# Patient Record
Sex: Female | Born: 1940
Health system: Southern US, Community
[De-identification: ages and names within clinical notes are randomized; demographics above are authoritative.]

## PROBLEM LIST (undated history)

## (undated) DIAGNOSIS — I10 Essential (primary) hypertension: Secondary | ICD-10-CM

## (undated) DIAGNOSIS — Z9221 Personal history of antineoplastic chemotherapy: Secondary | ICD-10-CM

## (undated) DIAGNOSIS — S060X9A Concussion with loss of consciousness of unspecified duration, initial encounter: Secondary | ICD-10-CM

## (undated) DIAGNOSIS — F419 Anxiety disorder, unspecified: Secondary | ICD-10-CM

## (undated) DIAGNOSIS — E785 Hyperlipidemia, unspecified: Secondary | ICD-10-CM

## (undated) DIAGNOSIS — J302 Other seasonal allergic rhinitis: Secondary | ICD-10-CM

## (undated) DIAGNOSIS — M1990 Unspecified osteoarthritis, unspecified site: Secondary | ICD-10-CM

## (undated) DIAGNOSIS — F329 Major depressive disorder, single episode, unspecified: Secondary | ICD-10-CM

## (undated) DIAGNOSIS — M8580 Other specified disorders of bone density and structure, unspecified site: Secondary | ICD-10-CM

## (undated) DIAGNOSIS — Z923 Personal history of irradiation: Secondary | ICD-10-CM

## (undated) DIAGNOSIS — F32A Depression, unspecified: Secondary | ICD-10-CM

## (undated) DIAGNOSIS — F410 Panic disorder [episodic paroxysmal anxiety] without agoraphobia: Secondary | ICD-10-CM

## (undated) HISTORY — DX: Depression, unspecified: F32.A

## (undated) HISTORY — DX: Concussion with loss of consciousness of unspecified duration, initial encounter: S06.0X9A

## (undated) HISTORY — PX: BREAST BIOPSY: SHX20

## (undated) HISTORY — DX: Hyperlipidemia, unspecified: E78.5

## (undated) HISTORY — DX: Anxiety disorder, unspecified: F41.9

## (undated) HISTORY — PX: TONSILLECTOMY: SUR1361

## (undated) HISTORY — DX: Other specified disorders of bone density and structure, unspecified site: M85.80

## (undated) HISTORY — DX: Major depressive disorder, single episode, unspecified: F32.9

---

## 1980-11-02 HISTORY — PX: BLADDER SUSPENSION: SHX72

## 1982-11-02 HISTORY — PX: BREAST SURGERY: SHX581

## 1988-12-09 HISTORY — PX: DILATION AND CURETTAGE OF UTERUS: SHX78

## 1989-01-31 HISTORY — PX: ABDOMINAL HYSTERECTOMY: SHX81

## 1994-11-02 HISTORY — PX: BACK SURGERY: SHX140

## 1999-12-11 ENCOUNTER — Ambulatory Visit (HOSPITAL_COMMUNITY): Admission: RE | Admit: 1999-12-11 | Discharge: 1999-12-11 | Payer: Self-pay | Admitting: Obstetrics & Gynecology

## 2000-01-20 ENCOUNTER — Encounter: Payer: Self-pay | Admitting: Obstetrics and Gynecology

## 2000-01-20 ENCOUNTER — Ambulatory Visit (HOSPITAL_COMMUNITY): Admission: RE | Admit: 2000-01-20 | Discharge: 2000-01-20 | Payer: Self-pay | Admitting: Obstetrics and Gynecology

## 2000-11-02 DIAGNOSIS — S060XAA Concussion with loss of consciousness status unknown, initial encounter: Secondary | ICD-10-CM

## 2000-11-02 DIAGNOSIS — S060X9A Concussion with loss of consciousness of unspecified duration, initial encounter: Secondary | ICD-10-CM

## 2000-11-02 HISTORY — DX: Concussion with loss of consciousness of unspecified duration, initial encounter: S06.0X9A

## 2000-11-02 HISTORY — DX: Concussion with loss of consciousness status unknown, initial encounter: S06.0XAA

## 2002-06-14 ENCOUNTER — Emergency Department (HOSPITAL_COMMUNITY): Admission: EM | Admit: 2002-06-14 | Discharge: 2002-06-15 | Payer: Self-pay | Admitting: Emergency Medicine

## 2002-06-15 ENCOUNTER — Encounter: Payer: Self-pay | Admitting: Emergency Medicine

## 2003-05-11 ENCOUNTER — Encounter: Payer: Self-pay | Admitting: Obstetrics and Gynecology

## 2003-05-11 ENCOUNTER — Ambulatory Visit (HOSPITAL_COMMUNITY): Admission: RE | Admit: 2003-05-11 | Discharge: 2003-05-11 | Payer: Self-pay | Admitting: Obstetrics and Gynecology

## 2003-07-11 ENCOUNTER — Encounter: Payer: Self-pay | Admitting: Internal Medicine

## 2003-07-11 ENCOUNTER — Ambulatory Visit (HOSPITAL_COMMUNITY): Admission: RE | Admit: 2003-07-11 | Discharge: 2003-07-11 | Payer: Self-pay | Admitting: Internal Medicine

## 2004-05-14 ENCOUNTER — Ambulatory Visit (HOSPITAL_COMMUNITY): Admission: RE | Admit: 2004-05-14 | Discharge: 2004-05-14 | Payer: Self-pay | Admitting: Obstetrics and Gynecology

## 2004-08-06 ENCOUNTER — Ambulatory Visit (HOSPITAL_COMMUNITY): Admission: RE | Admit: 2004-08-06 | Discharge: 2004-08-06 | Payer: Self-pay | Admitting: Neurology

## 2004-12-12 ENCOUNTER — Observation Stay (HOSPITAL_COMMUNITY): Admission: EM | Admit: 2004-12-12 | Discharge: 2004-12-13 | Payer: Self-pay | Admitting: Emergency Medicine

## 2004-12-17 ENCOUNTER — Encounter (HOSPITAL_COMMUNITY): Admission: RE | Admit: 2004-12-17 | Discharge: 2004-12-18 | Payer: Self-pay | Admitting: Internal Medicine

## 2005-07-14 ENCOUNTER — Ambulatory Visit (HOSPITAL_COMMUNITY): Admission: RE | Admit: 2005-07-14 | Discharge: 2005-07-14 | Payer: Self-pay | Admitting: Internal Medicine

## 2005-08-07 ENCOUNTER — Ambulatory Visit: Payer: Self-pay | Admitting: Internal Medicine

## 2005-08-07 ENCOUNTER — Ambulatory Visit (HOSPITAL_COMMUNITY): Admission: RE | Admit: 2005-08-07 | Discharge: 2005-08-07 | Payer: Self-pay | Admitting: Internal Medicine

## 2005-08-07 ENCOUNTER — Encounter (INDEPENDENT_AMBULATORY_CARE_PROVIDER_SITE_OTHER): Payer: Self-pay | Admitting: Internal Medicine

## 2009-10-09 ENCOUNTER — Ambulatory Visit (HOSPITAL_COMMUNITY): Admission: RE | Admit: 2009-10-09 | Discharge: 2009-10-09 | Payer: Self-pay | Admitting: Internal Medicine

## 2010-08-20 ENCOUNTER — Encounter (INDEPENDENT_AMBULATORY_CARE_PROVIDER_SITE_OTHER): Payer: Self-pay | Admitting: *Deleted

## 2010-12-02 ENCOUNTER — Other Ambulatory Visit (HOSPITAL_COMMUNITY): Payer: Self-pay | Admitting: Internal Medicine

## 2010-12-02 DIAGNOSIS — Z139 Encounter for screening, unspecified: Secondary | ICD-10-CM

## 2010-12-02 NOTE — Letter (Signed)
Summary: Recall, Screening Colonoscopy Only  [facility]  [address]   [place], [place] [address]   Phone: [phone]  Fax: [phone]    August 20, 2010  MADLYNN LUNDEEN [address] [place], [place]  [address] [birth year]   Dear Ms. Rizzi,   Our records indicate it is time to schedule your colonoscopy.    Please call our office at [phone] and ask for the nurse.   Thank you,    Hendricks Limes, LPN Cloria Spring, LPN  [facility] Ph: [phone]   Fax: [phone]

## 2010-12-15 ENCOUNTER — Ambulatory Visit (HOSPITAL_COMMUNITY)
Admission: RE | Admit: 2010-12-15 | Discharge: 2010-12-15 | Disposition: A | Discharge status: Home / Self Care | Payer: MEDICARE | Source: Ambulatory Visit | Attending: Internal Medicine | Admitting: Internal Medicine

## 2010-12-15 DIAGNOSIS — Z1231 Encounter for screening mammogram for malignant neoplasm of breast: Secondary | ICD-10-CM | POA: Insufficient documentation

## 2010-12-15 DIAGNOSIS — Z139 Encounter for screening, unspecified: Secondary | ICD-10-CM

## 2011-03-20 NOTE — Op Note (Signed)
Doris Ellis, Doris Ellis                  ACCOUNT NO.:  [id number]   MEDICAL RECORD NO.:  [id number]          PATIENT TYPE:  AMB   LOCATION:  DAY                           FACILITY:  [facility]   PHYSICIAN:  Lionel December, M.D.    DATE OF BIRTH:  [birth year]   DATE OF PROCEDURE:  08/07/2005  DATE OF DISCHARGE:                                 OPERATIVE REPORT   PROCEDURE:  Colonoscopy.   INDICATION:  Doris Ellis is a 71 year old Caucasian female with progressive  constipation and lower abdominal pain. She has had hematochezia felt to be  secondary to hemorrhoids. She is undergoing diagnostic colonoscopy.  Procedure risks were reviewed with the patient, and informed consent was  obtained.   PREMEDICATION:  Demerol 50 mg IV, Versed 6 mg IV.   FINDINGS:  Procedure performed in endoscopy suite. The patient's vital signs  and O2 saturation were monitored during the procedure and remained stable.  The patient was placed in left lateral position and rectal examination  performed. No abnormality noted on external or digital exam. Olympus  videoscope was placed in rectum and advanced under vision into sigmoid colon  and beyond. Preparation was excellent. Scope was passed to cecum which was  identified by ileocecal valve and appendiceal orifice. Pictures taken for  the record. As the scope was withdrawn, colonic mucosa was carefully  examined. A single small polyp at sigmoid colon which was ablated via cold  biopsy. Mucosa of the rest of the sigmoid colon and rectum was normal. Scope  was retroflexed to examine anorectal junction, and small hemorrhoids were  noted below the dentate line. Endoscope was straightened and withdrawn. The  patient tolerated the procedure well.   FINAL DIAGNOSIS:  1.  Single small polyp ablated via cold biopsy from the sigmoid colon.  2.  External hemorrhoids.   No evidence of colonic stricture, diverticulosis or mass.   RECOMMENDATIONS:  1.  High-fiber diet.  2.  FiberChoice 2  tablets daily.  3.  Lactulose 1 to 2 tablespoonful q.d. or q.o.d. Prescription given for 1      quart with refills for up to year. If this      therapy does not help with her constipation, she will call us. If      therapy does not ameliorate her constipation, will consider other      alternatives.  4.  I will be contacting the patient with biopsy results and further      recommendations.      Lionel December, M.D.  Electronically Signed     NR/MEDQ  D:  08/07/2005  T:  08/07/2005  Job:  [id number]   cc:   Kingsley Callander. Ouida Sills, MD  Fax: [phone]

## 2011-03-20 NOTE — Procedures (Signed)
NAMEALAINAH, PHANG                  ACCOUNT NO.:  [id number]   MEDICAL RECORD NO.:  [id number]          PATIENT TYPE:  OBV   LOCATION:  IC10                          FACILITY:  [facility]   PHYSICIAN:  Kingsley Callander. Ouida Sills, MD       DATE OF BIRTH:  [birth year]   DATE OF PROCEDURE:  DATE OF DISCHARGE:  12/13/2004                                    STRESS TEST   Ms. Loughner underwent an exercise stress test to evaluate recent symptoms of  chest tightness overnight last week and had ruled out for an MI. She  exercised 6 minutes 39 seconds (39 seconds into stage III of the Bruce  protocol) obtaining a maximal heart rate of 153 (96% of the age predicted  maximal heart rate) and a workload of 7 mets and discontinued exercise after  surpassing her target heart rate. There were no symptoms of chest pain.  There were no arrhythmias. There were no ST segment changes diagnostic of  ischemia. The baseline electrocardiogram revealed normal sinus rhythm at 64  beats per minute with small Q waves inferiorly. There were slight early  repolarization changes in the inferolateral leads.   IMPRESSION:  No evidence of exercise-induced ischemia. Myoview images are  pending.      ROF/MEDQ  D:  12/17/2004  T:  12/17/2004  Job:  [id number]

## 2011-03-20 NOTE — H&P (Signed)
Doris Ellis, Doris Ellis                  ACCOUNT NO.:  [id number]   MEDICAL RECORD NO.:  [id number]          PATIENT TYPE:  INP   LOCATION:  IC10                          FACILITY:  [facility]   PHYSICIAN:  Kingsley Callander. Ouida Sills, MD       DATE OF BIRTH:  Mar 14, 1941   DATE OF ADMISSION:  12/12/2004  DATE OF DISCHARGE:  LH                                HISTORY & PHYSICAL   CHIEF COMPLAINT:  Chest tightness and neck tightness.   HISTORY OF PRESENT ILLNESS:  This patient is a 70 year old white female who  presented to the emergency room after experiencing tightness in the upper  chest and neck. 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 She had been taken over to Dr. Sherwood Gambler for an EKG which was  reportedly abnormal. She was then sent to the emergency room where she was  evaluated. She was treated with nitroglycerin and had a partial response.  Her pain lessened. She did not experience shortness of breath, vomiting, or  diaphoresis. She had felt somewhat flushed. There were no associated arm  symptoms. She had not had exertional chest pain. She is a nonsmoker. She has  a history of hypercholesterolemia. Her last cholesterol was 243 last  September. She also had a mildly elevated glucose of 114. Her hemoglobin A1c  was 5.8. Her chest pain subsequently resolved in the emergency room.   PAST MEDICAL HISTORY:  1.  Hysterectomy.  2.  Cholecystectomy.  3.  Breast cyst excision.  4.  Herniated disk.  5.  Hypercholesterolemia.  6.  Cervical spondylosis.   MEDICATIONS:  1.  Climara 0.05 patch.  2.  Toradol p.r.n.  3.  Flexeril p.r.n.  4.  Xanax 0.5 h.s.   ALLERGIES:  None.   SOCIAL HISTORY:  She has not smoked cigarettes, drank alcohol, or used  drugs.   FAMILY HISTORY:  Father had leukemia. Mother had Guillain-Barre.   REVIEW OF SYSTEMS:  Noncontributory.   PHYSICAL EXAMINATION:  VITAL SIGNS:  Temperature 97.3, pulse 85,  respirations 16, blood pressure 135/89. Oxygen  saturation 95% on room air.  GENERAL:  Alert and oriented in no distress.  HEENT:  Eyes and pharynx appear normal, atraumatic and normocephalic.  NECK:  No JVD, thyromegaly, or carotid bruits.  LUNGS:  Clear.  HEART:  Regular with no murmurs or gallops.  CHEST:  Nontender.  ABDOMEN:  Nontender. No hepatosplenomegaly.  EXTREMITIES:  No calf tenderness or swelling. No cyanosis, clubbing, or  edema. Distal pulses are intact.  NEUROLOGICAL:  Grossly intact.  SKIN:  Warm and dry.   LABORATORY DATA:  White count 7.2, hemoglobin 15.2, platelets 309. Sodium  134, potassium 3.4, chloride 102, bicarb 24, BUN 12, creatinine 0.8, glucose  103. CK-MB 1.7 and 1.2, troponin I 0.05 and 0.05, myoglobin 43 and 69. Her  EKG reveals normal sinus rhythm with no ischemic changes. She has mild early  repolarization changes in the inferolateral leads. Her chest x-ray is  normal.   IMPRESSION:  1.  Chest pain. She will be admitted and  evaluated further with serial      cardiac enzymes and EKGs. She has been started on IV nitroglycerin and      IV heparin in the emergency room. Oral Protonix will be added      empirically.  2.  Hypokalemia. Will supplement orally.  3.  History of hyperlipidemia. Repeat lipids.  4.  Probable anxiety. Treat with Xanax p.r.n.      ROF/MEDQ  D:  12/13/2004  T:  12/13/2004  Job:  [id number]

## 2011-03-20 NOTE — Discharge Summary (Signed)
Doris Ellis, PENZA                  ACCOUNT NO.:  [id number]   MEDICAL RECORD NO.:  [id number]          PATIENT TYPE:  INP   LOCATION:  IC10                          FACILITY:  [facility]   PHYSICIAN:  Kingsley Callander. Ouida Sills, MD       DATE OF BIRTH:  Aug 25, 1941   DATE OF ADMISSION:  12/12/2004  DATE OF DISCHARGE:  02/11/2006LH                                 DISCHARGE SUMMARY   DISCHARGE DIAGNOSES:  1.  Chest pain, myocardial infarction ruled out.  2. Hypercholesterolemia.      3. Anxiety.  4. Cervical spondylosis.   HOSPITAL COURSE:  This patient is a 70 year old female who presented with  tightness in the upper chest and neck.  She was evaluated with serial  cardiac enzymes which were normal and serial EKGs which were normal.  Her  symptoms resolved. She was treated with IV nitroglycerin and IV heparin.  Her nitroglycerin was weaned off without recurrent pain.  She was  hemodynamically stable with a normal heart rate and normal blood pressure.  It was felt that there was a significant anxiety component to her pain.  She  will be evaluated further though with a nuclear stress test next week.  She  was improved and stable for discharge on February 11.   She was hypokalemic on admission at 3.4 and normalized to 3.6 with  supplements.  Additional studies for LFTs were normal.  Lipids and TSH are  pending.   FOLLOWUP:  The patient will be seen for stress test next week.   DISCHARGE MEDICATIONS:  1.  Zoloft 25 mg daily.  2.  Xanax 0.5 mg q.h.s.  3.  Climara 0.05 patch.  4.  Aspirin 81 mg daily.  5.  Toradol 10 mg t.i.d. p.r.n.  6.  Flexeril 5-10 mg t.i.d. p.r.n.   She will remain out of work until after her stress test.      ROF/MEDQ  D:  12/13/2004  T:  12/14/2004  Job:  [id number]

## 2011-07-04 DIAGNOSIS — M8580 Other specified disorders of bone density and structure, unspecified site: Secondary | ICD-10-CM

## 2011-07-04 HISTORY — DX: Other specified disorders of bone density and structure, unspecified site: M85.80

## 2011-07-07 ENCOUNTER — Encounter: Payer: Self-pay | Admitting: Gynecology

## 2011-07-07 ENCOUNTER — Ambulatory Visit (INDEPENDENT_AMBULATORY_CARE_PROVIDER_SITE_OTHER): Payer: Medicare Other | Admitting: Gynecology

## 2011-07-07 VITALS — BP 140/80 | Ht 64.25 in | Wt 154.0 lb

## 2011-07-07 DIAGNOSIS — B373 Candidiasis of vulva and vagina: Secondary | ICD-10-CM

## 2011-07-07 DIAGNOSIS — N898 Other specified noninflammatory disorders of vagina: Secondary | ICD-10-CM

## 2011-07-07 DIAGNOSIS — S060XAA Concussion with loss of consciousness status unknown, initial encounter: Secondary | ICD-10-CM | POA: Insufficient documentation

## 2011-07-07 DIAGNOSIS — N952 Postmenopausal atrophic vaginitis: Secondary | ICD-10-CM

## 2011-07-07 DIAGNOSIS — G4700 Insomnia, unspecified: Secondary | ICD-10-CM

## 2011-07-07 DIAGNOSIS — S060X9A Concussion with loss of consciousness of unspecified duration, initial encounter: Secondary | ICD-10-CM | POA: Insufficient documentation

## 2011-07-07 DIAGNOSIS — E785 Hyperlipidemia, unspecified: Secondary | ICD-10-CM | POA: Insufficient documentation

## 2011-07-07 DIAGNOSIS — Z79890 Hormone replacement therapy (postmenopausal): Secondary | ICD-10-CM

## 2011-07-07 DIAGNOSIS — N951 Menopausal and female climacteric states: Secondary | ICD-10-CM

## 2011-07-07 MED ORDER — FLUCONAZOLE 150 MG PO TABS
150.0000 mg | ORAL_TABLET | Freq: Once | ORAL | Status: AC
Start: 2011-07-07 — End: 2011-07-08

## 2011-07-07 MED ORDER — ALPRAZOLAM 1 MG PO TABS
1.0000 mg | ORAL_TABLET | Freq: Every evening | ORAL | Status: AC | PRN
Start: 2011-07-07 — End: 2011-08-06

## 2011-07-07 MED ORDER — ESTROGENS CONJUGATED 0.625 MG PO TABS: 0.6250 mg | ORAL_TABLET | Freq: Every day | ORAL | Status: DC

## 2011-07-07 NOTE — Progress Notes (Signed)
Doris Ellis 1941/02/06 [id number]        70 y.o.  for followup. She is a former patient of Dr. Leota Sauers and she is on ERT Premarin 0.625 daily.  She reports having tried weaning this in the past and had significant hot flushes and night sweats and has decided to continue on this. She actually states that she is part of the [facility] original study group. She also has tried a transdermal patch but had a reaction and ended up hospitalizing her and when restarting the pill did not have any issues with this.  She is status post TAH/BSO for bleeding in 1990.  Past medical history,surgical history, medications, allergies, family history and social history were all reviewed and documented in the EPIC chart. ROS:  Was performed and pertinent positives and negatives are included in the history.  Exam: chaperone present Filed Vitals:   07/07/11 1046  BP: 140/80   General appearance  Normal Skin grossly normal Head/Neck normal with no cervical or supraclavicular adenopathy thyroid normal Lungs  clear Cardiac RR, without RMG Abdominal  soft, nontender, without masses, organomegaly or hernia Breasts  examined lying and sitting without masses, retractions, discharge or axillary adenopathy. Pelvic  Ext/BUS/vagina  normal atrophic changes noted, white discharge noted KOH wet prep done  Adnexa  Without masses or tenderness    Anus and perineum  normal   Rectovaginal  normal sphincter tone without palpated masses or tenderness.    Assessment/Plan:  70 y.o. female for followup.    #1 ERT. I discussed with the patient the issues of ERT at age 68. The risks of stroke, heart attack, DVT as well as possible breast cancer risks reviewed. The WHI study, ACOG and NAMS statements of lowest dose for shortest period of time was also reviewed. I discussed again the transdermal versus oral route first pass effect and decrease risks of thrombosis with transdermal. After a lengthy discussion she wants to continue on  Premarin 0.625. She's doing well on this she has tried weaning several times and has had significant symptoms. I did suggest that she try weaning again during the winter months and she will try this but I refilled her times a year with Premarin 0.625 one by mouth daily #30 refill x12. #2 White discharge. KOH wet prep is positive for yeast we'll treat with Diflucan 150x1 dose #3 Insomnia. Patient has some issues with insomnia. She has issues with anxiety attacks occasionally. She is using Xanax 0.5 mg as needed and does well with this I refilled Xanax 1 mg #30 with 3 refills to be used 1/2-1 as needed. #4 Health maintenance. Self breast exams on a monthly basis discussed the urge. She had her mammography beginning of this year and she'll repeat the beginning of next year and continue with annual mammography. She is up-to-date with colon screening will followup with them at prescribed intervals. I ordered a bone density DEXA today. Increase calcium vitamin D was reviewed. No blood work was done this is done through her primary who follows her for her hypercholesterolemia and overall routine health maintenance.  I did not do a Pap smear today. I discussed with her current screening recommendations. She is over 65 and status post hysterectomy and has never had an abnormal Pap smear. She has received routine regular Pap smears the last being a year ago which was normal. I discussed with her stop doing Pap smears from now and she is comfortable with these recommendations.   Dara Lords MD, 11:48  AM 07/07/2011

## 2011-07-14 ENCOUNTER — Ambulatory Visit (INDEPENDENT_AMBULATORY_CARE_PROVIDER_SITE_OTHER): Payer: Medicare Other | Admitting: *Deleted

## 2011-07-14 DIAGNOSIS — M949 Disorder of cartilage, unspecified: Secondary | ICD-10-CM

## 2011-07-15 NOTE — Progress Notes (Signed)
Bone density performed

## 2011-07-21 ENCOUNTER — Telehealth (INDEPENDENT_AMBULATORY_CARE_PROVIDER_SITE_OTHER): Payer: Medicare Other | Admitting: Gynecology

## 2011-07-21 ENCOUNTER — Encounter: Payer: Self-pay | Admitting: Gynecology

## 2011-07-21 DIAGNOSIS — M899 Disorder of bone, unspecified: Secondary | ICD-10-CM

## 2011-07-21 DIAGNOSIS — M8580 Other specified disorders of bone density and structure, unspecified site: Secondary | ICD-10-CM

## 2011-07-21 NOTE — Telephone Encounter (Signed)
Tell patient DEXA scan showed osteopenia. I recommend she check a vitamin D level and I put in for this. Otherwise maximize calcium at 1200 mg daily and repeat DEXA in 2 years.

## 2011-07-21 NOTE — Telephone Encounter (Signed)
Patient informed note below.  Patient wanted the lab drawn at [facility] in [place].  We faxed order and they will send results

## 2011-07-23 ENCOUNTER — Telehealth: Payer: Self-pay | Admitting: *Deleted

## 2011-07-23 ENCOUNTER — Other Ambulatory Visit: Payer: Self-pay | Admitting: Gynecology

## 2011-07-23 NOTE — Telephone Encounter (Signed)
Lm for pt to call. Pt needs vit d. Level check.

## 2011-07-24 LAB — VITAMIN D 25 HYDROXY (VIT D DEFICIENCY, FRACTURES): Vit D, 25-Hydroxy: 44 ng/mL (ref 30–89)

## 2011-07-28 ENCOUNTER — Other Ambulatory Visit: Payer: Self-pay | Admitting: Gynecology

## 2011-07-28 ENCOUNTER — Telehealth: Payer: Self-pay | Admitting: *Deleted

## 2011-07-28 DIAGNOSIS — Z79890 Hormone replacement therapy (postmenopausal): Secondary | ICD-10-CM

## 2011-07-28 NOTE — Telephone Encounter (Signed)
Pt was informed with this on 07/21/11. Vit. D has been rechecked

## 2011-07-28 NOTE — Telephone Encounter (Signed)
Error

## 2011-12-07 ENCOUNTER — Other Ambulatory Visit: Payer: Self-pay | Admitting: *Deleted

## 2011-12-07 MED ORDER — ALPRAZOLAM 1 MG PO TABS: 1.0000 mg | ORAL_TABLET | Freq: Every evening | ORAL | Status: AC | PRN

## 2011-12-07 NOTE — Telephone Encounter (Signed)
rx called in

## 2012-01-27 ENCOUNTER — Encounter (INDEPENDENT_AMBULATORY_CARE_PROVIDER_SITE_OTHER): Payer: Self-pay | Admitting: *Deleted

## 2012-04-04 ENCOUNTER — Other Ambulatory Visit: Payer: Self-pay | Admitting: *Deleted

## 2012-04-04 MED ORDER — ALPRAZOLAM 1 MG PO TABS: 1.0000 mg | ORAL_TABLET | Freq: Every evening | ORAL | Status: DC | PRN

## 2012-04-04 NOTE — Telephone Encounter (Signed)
rx called in

## 2012-05-25 ENCOUNTER — Other Ambulatory Visit (HOSPITAL_COMMUNITY): Payer: Self-pay | Admitting: Internal Medicine

## 2012-05-25 DIAGNOSIS — Z139 Encounter for screening, unspecified: Secondary | ICD-10-CM

## 2012-05-26 ENCOUNTER — Ambulatory Visit (HOSPITAL_COMMUNITY)
Admission: RE | Admit: 2012-05-26 | Discharge: 2012-05-26 | Disposition: A | Discharge status: Home / Self Care | Payer: Medicare Other | Source: Ambulatory Visit | Attending: Internal Medicine | Admitting: Internal Medicine

## 2012-05-26 DIAGNOSIS — Z139 Encounter for screening, unspecified: Secondary | ICD-10-CM

## 2012-05-26 DIAGNOSIS — Z1231 Encounter for screening mammogram for malignant neoplasm of breast: Secondary | ICD-10-CM | POA: Insufficient documentation

## 2012-06-20 ENCOUNTER — Other Ambulatory Visit: Payer: Self-pay | Admitting: *Deleted

## 2012-06-20 MED ORDER — ALPRAZOLAM 1 MG PO TABS: 1.0000 mg | ORAL_TABLET | Freq: Every evening | ORAL | Status: DC | PRN

## 2012-07-07 ENCOUNTER — Encounter: Payer: Self-pay | Admitting: Gynecology

## 2012-07-07 ENCOUNTER — Ambulatory Visit (INDEPENDENT_AMBULATORY_CARE_PROVIDER_SITE_OTHER): Payer: Medicare Other | Admitting: Gynecology

## 2012-07-07 VITALS — BP 158/94 | Ht 64.0 in | Wt 148.0 lb

## 2012-07-07 DIAGNOSIS — N952 Postmenopausal atrophic vaginitis: Secondary | ICD-10-CM

## 2012-07-07 DIAGNOSIS — N951 Menopausal and female climacteric states: Secondary | ICD-10-CM

## 2012-07-07 DIAGNOSIS — Z79890 Hormone replacement therapy (postmenopausal): Secondary | ICD-10-CM

## 2012-07-07 DIAGNOSIS — G4700 Insomnia, unspecified: Secondary | ICD-10-CM

## 2012-07-07 MED ORDER — ALPRAZOLAM 1 MG PO TABS: 1.0000 mg | ORAL_TABLET | Freq: Every evening | ORAL | Status: DC | PRN

## 2012-07-07 MED ORDER — ESTROGENS CONJUGATED 0.625 MG PO TABS: 0.6250 mg | ORAL_TABLET | Freq: Every day | ORAL | Status: DC

## 2012-07-07 NOTE — Progress Notes (Signed)
Doris Ellis 06-Mar-1941 [id number]        71 y.o.  G2P2 for follow up exam.  Doing well on ERT.  Past medical history,surgical history, medications, allergies, family history and social history were all reviewed and documented in the EPIC chart. ROS:  Was performed and pertinent positives and negatives are included in the history.  Exam: Sherrilyn Rist assistant Filed Vitals:   07/07/12 1437  BP: 158/94  Height: 5\' 4"  (1.626 m)  Weight: 148 lb (67.132 kg)   General appearance  Normal Skin grossly normal Head/Neck normal with no cervical or supraclavicular adenopathy thyroid normal Lungs  clear Cardiac RR, without RMG Abdominal  soft, nontender, without masses, organomegaly or hernia Breasts  examined lying and sitting without masses, retractions, discharge or axillary adenopathy. Pelvic  Ext/BUS/vagina  normal mild atrophic changes  Adnexa  Without masses or tenderness    Anus and perineum  normal   Rectovaginal  normal sphincter tone without palpated masses or tenderness.    Assessment/Plan:  71 y.o. G2P2 female for annual exam.   1. ERT. Patient continues on Premarin 0.625. Begin discussed options of weaning. I reviewed the WHI study with increased risk of stroke heart attack DVT and breast cancer issues. ACOG and NAMS statements furlough status for shortness period of time reviewed. Patient was to continue, accepts the risks and I refilled her times a year. 2. Insomnia. Patient continues on Xanax 0.5-1 mg nightly. I refilled her #30x4. 3. Mammography. Patient had July 2013.  Will continue with annual mammography. SBE monthly reviewed. 4. Pap smear. No Pap smear done. She is at age 71 with no history of abnormal Pap smears before and status post hysterectomy for benign indications. 5. Osteopenia.  DEXA 07/2011 should T score -1.8 FRAX 11%/1.5%. Increase calcium vitamin D reviewed. Had normal vitamin D last year. We'll repeat DEXA next year at two-year interval. 6. Colonoscopy. Patient due  now she had polyps on colonoscopy 2006. She knows the need to schedule this. 7. Health maintenance. No blood work done today as is all done through her primary physician's office who she sees on her right or basis.  Blood pressure 158/94 discussed with her. Apparently it was elevated at her primary physician's office and on recheck was normal. She does have appointment to see him on a recheck at his office. Follow up one year, sooner as needed.   Dara Lords MD, 3:21 PM 07/07/2012

## 2012-07-07 NOTE — Patient Instructions (Signed)
Follow up one year for annual gynecologic exam 

## 2012-12-27 ENCOUNTER — Other Ambulatory Visit: Payer: Self-pay | Admitting: *Deleted

## 2012-12-27 MED ORDER — ALPRAZOLAM 1 MG PO TABS: 1.0000 mg | ORAL_TABLET | Freq: Every evening | ORAL | Status: DC | PRN

## 2012-12-28 NOTE — Telephone Encounter (Signed)
Called in KW 

## 2012-12-29 ENCOUNTER — Other Ambulatory Visit: Payer: Self-pay

## 2012-12-29 MED ORDER — ALPRAZOLAM 1 MG PO TABS: 1.0000 mg | ORAL_TABLET | Freq: Every evening | ORAL | Status: AC | PRN

## 2012-12-29 NOTE — Telephone Encounter (Signed)
rx called into pharmacy

## 2013-02-07 ENCOUNTER — Encounter (INDEPENDENT_AMBULATORY_CARE_PROVIDER_SITE_OTHER): Payer: Self-pay | Admitting: *Deleted

## 2013-02-16 ENCOUNTER — Encounter (INDEPENDENT_AMBULATORY_CARE_PROVIDER_SITE_OTHER): Payer: Self-pay | Admitting: *Deleted

## 2013-02-16 ENCOUNTER — Telehealth (INDEPENDENT_AMBULATORY_CARE_PROVIDER_SITE_OTHER): Payer: Self-pay | Admitting: *Deleted

## 2013-02-16 ENCOUNTER — Other Ambulatory Visit (INDEPENDENT_AMBULATORY_CARE_PROVIDER_SITE_OTHER): Payer: Self-pay | Admitting: *Deleted

## 2013-02-16 DIAGNOSIS — Z86010 Personal history of colonic polyps: Secondary | ICD-10-CM

## 2013-02-16 DIAGNOSIS — Z1211 Encounter for screening for malignant neoplasm of colon: Secondary | ICD-10-CM

## 2013-02-16 MED ORDER — PEG-KCL-NACL-NASULF-NA ASC-C 100 G PO SOLR: 1.0000 | Freq: Once | ORAL | Status: DC

## 2013-02-16 NOTE — Telephone Encounter (Signed)
Patient needs movi prep 

## 2013-02-20 ENCOUNTER — Telehealth (INDEPENDENT_AMBULATORY_CARE_PROVIDER_SITE_OTHER): Payer: Self-pay | Admitting: *Deleted

## 2013-02-20 DIAGNOSIS — Z1211 Encounter for screening for malignant neoplasm of colon: Secondary | ICD-10-CM

## 2013-02-20 MED ORDER — PEG 3350-KCL-NA BICARB-NACL 420 G PO SOLR: 4000.0000 mL | Freq: Once | ORAL | Status: DC

## 2013-02-20 NOTE — Telephone Encounter (Signed)
Patient needs trilyte, movi prep too expensive 

## 2013-03-02 ENCOUNTER — Telehealth (INDEPENDENT_AMBULATORY_CARE_PROVIDER_SITE_OTHER): Payer: Self-pay | Admitting: *Deleted

## 2013-03-02 NOTE — Telephone Encounter (Signed)
agree

## 2013-03-02 NOTE — Telephone Encounter (Signed)
  Procedure: tcs  Reason/Indication:  Hx polyps  Has patient had this procedure before?  Yes, 2006 (EPIC)  If so, when, by whom and where?    Is there a family history of colon cancer?  no  Who?  What age when diagnosed?    Is patient diabetic?   no      Does patient have prosthetic heart valve?  no  Do you have a pacemaker?  no  Has patient ever had endocarditis? no  Has patient had joint replacement within last 12 months?  no  Is patient on Coumadin, Plavix and/or Aspirin? yes  Medications: asa prn, Wellbutrin 150 mg daily, estradiol 0.5 mg daily, xanax 1 mg daily, pravastatin 10 mg daily, cyclobenzaprine 10 mg prn, vitamins  Allergies: see EPIC  Medication Adjustment: asa 2 days  Procedure date & time: 03/30/13 at 930

## 2013-03-09 ENCOUNTER — Encounter (HOSPITAL_COMMUNITY): Payer: Self-pay | Admitting: Pharmacy Technician

## 2013-03-30 ENCOUNTER — Encounter (HOSPITAL_COMMUNITY): Payer: Self-pay | Admitting: *Deleted

## 2013-03-30 ENCOUNTER — Ambulatory Visit (HOSPITAL_COMMUNITY)
Admission: RE | Admit: 2013-03-30 | Discharge: 2013-03-30 | Disposition: A | Discharge status: Home / Self Care | Payer: Medicare Other | Source: Ambulatory Visit | Attending: Internal Medicine | Admitting: Internal Medicine

## 2013-03-30 ENCOUNTER — Encounter (HOSPITAL_COMMUNITY)
Admission: RE | Disposition: A | Discharge status: Home / Self Care | Payer: Self-pay | Source: Ambulatory Visit | Attending: Internal Medicine

## 2013-03-30 DIAGNOSIS — Z860100 Personal history of colon polyps, unspecified: Secondary | ICD-10-CM | POA: Insufficient documentation

## 2013-03-30 DIAGNOSIS — D126 Benign neoplasm of colon, unspecified: Secondary | ICD-10-CM

## 2013-03-30 DIAGNOSIS — K644 Residual hemorrhoidal skin tags: Secondary | ICD-10-CM | POA: Insufficient documentation

## 2013-03-30 DIAGNOSIS — Z83719 Family history of colon polyps, unspecified: Secondary | ICD-10-CM | POA: Insufficient documentation

## 2013-03-30 DIAGNOSIS — Z8371 Family history of colonic polyps: Secondary | ICD-10-CM | POA: Insufficient documentation

## 2013-03-30 HISTORY — PX: COLONOSCOPY: SHX5424

## 2013-03-30 HISTORY — DX: Unspecified osteoarthritis, unspecified site: M19.90

## 2013-03-30 HISTORY — DX: Other seasonal allergic rhinitis: J30.2

## 2013-03-30 HISTORY — DX: Panic disorder (episodic paroxysmal anxiety): F41.0

## 2013-03-30 SURGERY — COLONOSCOPY
Anesthesia: Moderate Sedation

## 2013-03-30 MED ORDER — MEPERIDINE HCL 50 MG/ML IJ SOLN
INTRAMUSCULAR | Status: DC | PRN
  Administered 2013-03-30 (×2): 25 mg via INTRAVENOUS

## 2013-03-30 MED ORDER — MIDAZOLAM HCL 5 MG/5ML IJ SOLN
INTRAMUSCULAR | Status: DC | PRN
  Administered 2013-03-30 (×4): 2 mg via INTRAVENOUS

## 2013-03-30 MED ORDER — MEPERIDINE HCL 50 MG/ML IJ SOLN
INTRAMUSCULAR | Status: AC
  Filled 2013-03-30: qty 1

## 2013-03-30 MED ORDER — SODIUM CHLORIDE 0.9 % IV SOLN
INTRAVENOUS | Status: DC
  Administered 2013-03-30: 09:00:00 via INTRAVENOUS

## 2013-03-30 MED ORDER — MIDAZOLAM HCL 5 MG/5ML IJ SOLN
INTRAMUSCULAR | Status: AC
  Filled 2013-03-30: qty 10

## 2013-03-30 MED ORDER — SIMETHICONE 40 MG/0.6ML PO SUSP
ORAL | Status: DC | PRN
  Administered 2013-03-30: 09:00:00

## 2013-03-30 NOTE — Op Note (Signed)
COLONOSCOPY PROCEDURE REPORT  PATIENT:  Doris Ellis  MR#:  [id number] Birthdate:  [birth year], 72 y.o., female Endoscopist:  Dr. Malissa Hippo, MD Referred By:  Dr. Carylon Perches, MD Procedure Date: 03/30/2013  Procedure:   Colonoscopy  Indications:  Patient is 72 year old Caucasian female with history of colonic adenoma and family history of colonic polyps(father).  Informed Consent:  The procedure and risks were reviewed with the patient and informed consent was obtained.  Medications:  Demerol 50 mg IV Versed 8 mg IV  Description of procedure:  After a digital rectal exam was performed, that colonoscope was advanced from the anus through the rectum and colon to the area of the cecum, ileocecal valve and appendiceal orifice. The cecum was deeply intubated. These structures were well-seen and photographed for the record. From the level of the cecum and ileocecal valve, the scope was slowly and cautiously withdrawn. The mucosal surfaces were carefully surveyed utilizing scope tip to flexion to facilitate fold flattening as needed. The scope was pulled down into the rectum where a thorough exam including retroflexion was performed.  Findings:   Prep excellent. Small polyps ablated via cold biopsy from cecum and submitted along with another polyp from ascending colon which was also removed via cold biopsy. Mucosa of the rest of the colon and rectum was normal. Small hemorrhoids below the dentate line.   Therapeutic/Diagnostic Maneuvers Performed:  See above  Complications:  None  Cecal Withdrawal Time:  10 minutes  Impression:  Examination performed to cecum. Two small polyps ablated via cold biopsy and submitted together(cecum and ascending colon). Small external hemorrhoids.  Recommendations:  Standard instructions given. I will contact patient with biopsy results and further recommendations.  Doris Ellis U  03/30/2013 9:47 AM  CC: Dr. Carylon Perches, MD & Dr. Bonnetta Barry ref. provider  found

## 2013-03-30 NOTE — H&P (Signed)
Doris Ellis is an 72 y.o. female.   Chief Complaint: Doris Ellis's here for colonoscopy. HPI: Doris Ellis is 72 year old Caucasian female who is here for surveillance colonoscopy. Doris Ellis single small tubular adenoma removed 5 years ago. Doris Ellis denies abdominal pain change in Doris Ellis bowel habits or rectal bleeding. Doris Ellis's father had multiple colonic polyps. Doris Ellis also had CLL non-Hodgkin's lymphoma  Past Medical History  Diagnosis Date  . Anxiety   . Depression   . Hyperlipidemia   . Concussion 1985  . Mild concussion 2002    tripped over bag at work  . Osteopenia 07/2011    T score -1.8 FRAX 11%/1.5%  . Seasonal allergies   . Panic attacks   . Arthritis     Past Surgical History  Procedure Laterality Date  . Breast surgery  1984    cyst-benign  . Back surgery  1996    ruputured disc L-5  . Bladder suspension  1982  . Dilation and curettage of uterus  12/09/1988    abnormal uterine bleeding  . Abdominal hysterectomy  april 1990    TAH BSO    Family History  Problem Relation Age of Onset  . Cancer Father     non-hogkins lymphoma, & CLL  . Diabetes Maternal Uncle   . Hypertension Maternal Grandmother   . Heart disease Maternal Grandfather   . Cancer Maternal Grandfather     lung cancer  . Osteoporosis Maternal Aunt    Social History:  reports that Doris Ellis has never smoked. Doris Ellis has never used smokeless tobacco. Doris Ellis reports that  drinks alcohol. Doris Ellis reports that Doris Ellis does not use illicit drugs.  Allergies:  Allergies  Allergen Reactions  . Lipitor (Atorvastatin Calcium) Other (See Comments)    Muscle weakness  . Zocor (Simvastatin - High Dose)     Muscle weakness    Medications Prior to Admission  Medication Sig Dispense Refill  . ALPRAZolam (XANAX) 1 MG tablet Take 1 mg by mouth at bedtime as needed for sleep.      Marland Kitchen buPROPion (WELLBUTRIN XL) 150 MG 24 hr tablet Take 150 mg by mouth daily.      . Calcium Carbonate-Vitamin D (CALCIUM + D PO) Take 1 tablet by mouth daily.       .  cyclobenzaprine (FLEXERIL) 10 MG tablet Take 10 mg by mouth 3 (three) times daily as needed (muscle spasms).       Marland Kitchen estradiol (ESTRACE) 0.5 MG tablet Take 0.5 mg by mouth daily.      Marland Kitchen loratadine (CLARITIN) 10 MG tablet Take 10 mg by mouth daily.      . multivitamin (THERAGRAN) per tablet Take 1 tablet by mouth daily.        . pravastatin (PRAVACHOL) 10 MG tablet Take 10 mg by mouth daily.        . Pyridoxine HCl (VITAMIN B-6 PO) Take 1 tablet by mouth daily.         No results found for this or any previous visit (from the past 48 hour(s)). No results found.  ROS  Blood pressure 119/73, pulse 76, temperature 98.4 F (36.9 C), temperature source Oral, resp. rate 16, height 5\' 4"  (1.626 m), weight 150 lb (68.04 kg), last menstrual period 03/05/1989, SpO2 94.00%. Physical Exam  Constitutional: Doris Ellis appears well-developed and well-nourished.  HENT:  Mouth/Throat: Oropharynx is clear and moist.  Eyes: Conjunctivae are normal. No scleral icterus.  Neck: No thyromegaly present.  Cardiovascular: Normal rate, regular rhythm and normal heart sounds.   No  murmur heard. Respiratory: Effort normal and breath sounds normal.  GI: Soft. Doris Ellis exhibits no distension and no mass. There is no tenderness.  Musculoskeletal: Doris Ellis exhibits no edema.  Lymphadenopathy:    Doris Ellis has no cervical adenopathy.  Neurological: Doris Ellis is alert.  Skin: Skin is warm and dry.     Assessment/Plan History of colonic adenoma. Surveillance colonoscopy.  REHMAN,NAJEEB U 03/30/2013, 9:13 AM

## 2013-04-03 ENCOUNTER — Encounter (HOSPITAL_COMMUNITY): Payer: Self-pay | Admitting: Internal Medicine

## 2013-04-06 ENCOUNTER — Encounter (INDEPENDENT_AMBULATORY_CARE_PROVIDER_SITE_OTHER): Payer: Self-pay | Admitting: *Deleted

## 2013-06-07 ENCOUNTER — Other Ambulatory Visit (HOSPITAL_COMMUNITY): Payer: Self-pay | Admitting: Internal Medicine

## 2013-06-07 DIAGNOSIS — Z139 Encounter for screening, unspecified: Secondary | ICD-10-CM

## 2013-06-15 ENCOUNTER — Ambulatory Visit (HOSPITAL_COMMUNITY)
Admission: RE | Admit: 2013-06-15 | Discharge: 2013-06-15 | Disposition: A | Discharge status: Home / Self Care | Payer: Medicare Other | Source: Ambulatory Visit | Attending: Internal Medicine | Admitting: Internal Medicine

## 2013-06-15 DIAGNOSIS — Z139 Encounter for screening, unspecified: Secondary | ICD-10-CM

## 2013-06-15 DIAGNOSIS — Z1231 Encounter for screening mammogram for malignant neoplasm of breast: Secondary | ICD-10-CM | POA: Insufficient documentation

## 2014-08-21 ENCOUNTER — Other Ambulatory Visit (HOSPITAL_COMMUNITY): Payer: Self-pay | Admitting: Internal Medicine

## 2014-08-21 DIAGNOSIS — Z1231 Encounter for screening mammogram for malignant neoplasm of breast: Secondary | ICD-10-CM

## 2014-09-03 ENCOUNTER — Encounter (HOSPITAL_COMMUNITY): Payer: Self-pay | Admitting: Internal Medicine

## 2014-09-03 ENCOUNTER — Ambulatory Visit (HOSPITAL_COMMUNITY)
Admission: RE | Admit: 2014-09-03 | Discharge: 2014-09-03 | Disposition: A | Discharge status: Home / Self Care | Payer: Medicare Other | Source: Ambulatory Visit | Attending: Internal Medicine | Admitting: Internal Medicine

## 2014-09-03 DIAGNOSIS — Z1231 Encounter for screening mammogram for malignant neoplasm of breast: Secondary | ICD-10-CM | POA: Insufficient documentation

## 2016-02-19 DIAGNOSIS — F419 Anxiety disorder, unspecified: Secondary | ICD-10-CM | POA: Diagnosis not present

## 2016-02-19 DIAGNOSIS — E785 Hyperlipidemia, unspecified: Secondary | ICD-10-CM | POA: Diagnosis not present

## 2016-02-19 DIAGNOSIS — I10 Essential (primary) hypertension: Secondary | ICD-10-CM | POA: Diagnosis not present

## 2016-02-25 DIAGNOSIS — F3340 Major depressive disorder, recurrent, in remission, unspecified: Secondary | ICD-10-CM | POA: Diagnosis not present

## 2016-02-25 DIAGNOSIS — E785 Hyperlipidemia, unspecified: Secondary | ICD-10-CM | POA: Diagnosis not present

## 2016-02-25 DIAGNOSIS — Z23 Encounter for immunization: Secondary | ICD-10-CM | POA: Diagnosis not present

## 2016-02-25 DIAGNOSIS — Z6826 Body mass index (BMI) 26.0-26.9, adult: Secondary | ICD-10-CM | POA: Diagnosis not present

## 2016-02-26 ENCOUNTER — Other Ambulatory Visit (HOSPITAL_COMMUNITY): Payer: Self-pay | Admitting: Internal Medicine

## 2016-02-26 DIAGNOSIS — Z1231 Encounter for screening mammogram for malignant neoplasm of breast: Secondary | ICD-10-CM

## 2016-03-02 ENCOUNTER — Ambulatory Visit (HOSPITAL_COMMUNITY)
Admission: RE | Admit: 2016-03-02 | Discharge: 2016-03-02 | Disposition: A | Discharge status: Home / Self Care | Payer: PPO | Source: Ambulatory Visit | Attending: Internal Medicine | Admitting: Internal Medicine

## 2016-03-02 DIAGNOSIS — Z1231 Encounter for screening mammogram for malignant neoplasm of breast: Secondary | ICD-10-CM | POA: Diagnosis not present

## 2016-03-05 ENCOUNTER — Other Ambulatory Visit: Payer: Self-pay | Admitting: Internal Medicine

## 2016-03-05 DIAGNOSIS — R928 Other abnormal and inconclusive findings on diagnostic imaging of breast: Secondary | ICD-10-CM

## 2016-03-10 ENCOUNTER — Ambulatory Visit (HOSPITAL_COMMUNITY)
Admission: RE | Admit: 2016-03-10 | Discharge: 2016-03-10 | Disposition: A | Discharge status: Home / Self Care | Payer: PPO | Source: Ambulatory Visit | Attending: Internal Medicine | Admitting: Internal Medicine

## 2016-03-10 DIAGNOSIS — R928 Other abnormal and inconclusive findings on diagnostic imaging of breast: Secondary | ICD-10-CM | POA: Insufficient documentation

## 2016-03-10 DIAGNOSIS — R922 Inconclusive mammogram: Secondary | ICD-10-CM | POA: Diagnosis not present

## 2016-09-08 DIAGNOSIS — F3340 Major depressive disorder, recurrent, in remission, unspecified: Secondary | ICD-10-CM | POA: Diagnosis not present

## 2016-09-08 DIAGNOSIS — I10 Essential (primary) hypertension: Secondary | ICD-10-CM | POA: Diagnosis not present

## 2017-01-05 DIAGNOSIS — H43812 Vitreous degeneration, left eye: Secondary | ICD-10-CM | POA: Diagnosis not present

## 2017-03-02 DIAGNOSIS — Z79899 Other long term (current) drug therapy: Secondary | ICD-10-CM | POA: Diagnosis not present

## 2017-03-02 DIAGNOSIS — R7301 Impaired fasting glucose: Secondary | ICD-10-CM | POA: Diagnosis not present

## 2017-03-02 DIAGNOSIS — E785 Hyperlipidemia, unspecified: Secondary | ICD-10-CM | POA: Diagnosis not present

## 2017-03-02 DIAGNOSIS — F419 Anxiety disorder, unspecified: Secondary | ICD-10-CM | POA: Diagnosis not present

## 2017-03-02 DIAGNOSIS — G43B0 Ophthalmoplegic migraine, not intractable: Secondary | ICD-10-CM | POA: Diagnosis not present

## 2017-03-02 DIAGNOSIS — I10 Essential (primary) hypertension: Secondary | ICD-10-CM | POA: Diagnosis not present

## 2017-03-09 DIAGNOSIS — E785 Hyperlipidemia, unspecified: Secondary | ICD-10-CM | POA: Diagnosis not present

## 2017-03-09 DIAGNOSIS — Z6825 Body mass index (BMI) 25.0-25.9, adult: Secondary | ICD-10-CM | POA: Diagnosis not present

## 2017-03-09 DIAGNOSIS — R030 Elevated blood-pressure reading, without diagnosis of hypertension: Secondary | ICD-10-CM | POA: Diagnosis not present

## 2017-03-09 DIAGNOSIS — Z0001 Encounter for general adult medical examination with abnormal findings: Secondary | ICD-10-CM | POA: Diagnosis not present

## 2017-03-16 ENCOUNTER — Other Ambulatory Visit (HOSPITAL_COMMUNITY): Payer: Self-pay | Admitting: Internal Medicine

## 2017-03-16 DIAGNOSIS — Z780 Asymptomatic menopausal state: Secondary | ICD-10-CM

## 2017-03-17 ENCOUNTER — Other Ambulatory Visit (HOSPITAL_COMMUNITY): Payer: Self-pay | Admitting: Internal Medicine

## 2017-03-17 DIAGNOSIS — Z1231 Encounter for screening mammogram for malignant neoplasm of breast: Secondary | ICD-10-CM

## 2017-03-30 DIAGNOSIS — H52202 Unspecified astigmatism, left eye: Secondary | ICD-10-CM | POA: Diagnosis not present

## 2017-03-30 DIAGNOSIS — H25013 Cortical age-related cataract, bilateral: Secondary | ICD-10-CM | POA: Diagnosis not present

## 2017-03-30 DIAGNOSIS — H5212 Myopia, left eye: Secondary | ICD-10-CM | POA: Diagnosis not present

## 2017-03-30 DIAGNOSIS — H52201 Unspecified astigmatism, right eye: Secondary | ICD-10-CM | POA: Diagnosis not present

## 2017-03-30 DIAGNOSIS — H524 Presbyopia: Secondary | ICD-10-CM | POA: Diagnosis not present

## 2017-03-30 DIAGNOSIS — H2513 Age-related nuclear cataract, bilateral: Secondary | ICD-10-CM | POA: Diagnosis not present

## 2017-03-30 DIAGNOSIS — H5201 Hypermetropia, right eye: Secondary | ICD-10-CM | POA: Diagnosis not present

## 2017-03-31 DIAGNOSIS — H2513 Age-related nuclear cataract, bilateral: Secondary | ICD-10-CM | POA: Diagnosis not present

## 2017-03-31 DIAGNOSIS — H25013 Cortical age-related cataract, bilateral: Secondary | ICD-10-CM | POA: Diagnosis not present

## 2017-04-01 ENCOUNTER — Ambulatory Visit (HOSPITAL_COMMUNITY)
Admission: RE | Admit: 2017-04-01 | Discharge: 2017-04-01 | Disposition: A | Discharge status: Home / Self Care | Payer: PPO | Source: Ambulatory Visit | Attending: Internal Medicine | Admitting: Internal Medicine

## 2017-04-01 DIAGNOSIS — Z780 Asymptomatic menopausal state: Secondary | ICD-10-CM | POA: Diagnosis not present

## 2017-04-01 DIAGNOSIS — Z1231 Encounter for screening mammogram for malignant neoplasm of breast: Secondary | ICD-10-CM | POA: Diagnosis not present

## 2017-04-01 DIAGNOSIS — M85851 Other specified disorders of bone density and structure, right thigh: Secondary | ICD-10-CM | POA: Diagnosis not present

## 2017-04-05 DIAGNOSIS — E039 Hypothyroidism, unspecified: Secondary | ICD-10-CM | POA: Diagnosis not present

## 2017-04-05 DIAGNOSIS — Z1159 Encounter for screening for other viral diseases: Secondary | ICD-10-CM | POA: Diagnosis not present

## 2017-04-07 DIAGNOSIS — H25011 Cortical age-related cataract, right eye: Secondary | ICD-10-CM | POA: Diagnosis not present

## 2017-04-07 DIAGNOSIS — H2511 Age-related nuclear cataract, right eye: Secondary | ICD-10-CM | POA: Diagnosis not present

## 2017-04-07 DIAGNOSIS — H2512 Age-related nuclear cataract, left eye: Secondary | ICD-10-CM | POA: Diagnosis not present

## 2017-04-07 DIAGNOSIS — H25012 Cortical age-related cataract, left eye: Secondary | ICD-10-CM | POA: Diagnosis not present

## 2017-04-14 DIAGNOSIS — H25012 Cortical age-related cataract, left eye: Secondary | ICD-10-CM | POA: Diagnosis not present

## 2017-04-14 DIAGNOSIS — H2512 Age-related nuclear cataract, left eye: Secondary | ICD-10-CM | POA: Diagnosis not present

## 2017-04-15 DIAGNOSIS — R030 Elevated blood-pressure reading, without diagnosis of hypertension: Secondary | ICD-10-CM | POA: Diagnosis not present

## 2017-07-22 DIAGNOSIS — F411 Generalized anxiety disorder: Secondary | ICD-10-CM | POA: Diagnosis not present

## 2017-07-22 DIAGNOSIS — Z23 Encounter for immunization: Secondary | ICD-10-CM | POA: Diagnosis not present

## 2017-07-22 DIAGNOSIS — E785 Hyperlipidemia, unspecified: Secondary | ICD-10-CM | POA: Diagnosis not present

## 2017-08-31 DIAGNOSIS — Z961 Presence of intraocular lens: Secondary | ICD-10-CM | POA: Diagnosis not present

## 2017-11-16 DIAGNOSIS — F411 Generalized anxiety disorder: Secondary | ICD-10-CM | POA: Diagnosis not present

## 2017-11-16 DIAGNOSIS — E785 Hyperlipidemia, unspecified: Secondary | ICD-10-CM | POA: Diagnosis not present

## 2018-04-07 DIAGNOSIS — E785 Hyperlipidemia, unspecified: Secondary | ICD-10-CM | POA: Diagnosis not present

## 2018-04-07 DIAGNOSIS — F419 Anxiety disorder, unspecified: Secondary | ICD-10-CM | POA: Diagnosis not present

## 2018-04-07 DIAGNOSIS — Z79899 Other long term (current) drug therapy: Secondary | ICD-10-CM | POA: Diagnosis not present

## 2018-04-07 DIAGNOSIS — I10 Essential (primary) hypertension: Secondary | ICD-10-CM | POA: Diagnosis not present

## 2018-04-14 DIAGNOSIS — F329 Major depressive disorder, single episode, unspecified: Secondary | ICD-10-CM | POA: Diagnosis not present

## 2018-04-14 DIAGNOSIS — Z0001 Encounter for general adult medical examination with abnormal findings: Secondary | ICD-10-CM | POA: Diagnosis not present

## 2018-04-14 DIAGNOSIS — M5136 Other intervertebral disc degeneration, lumbar region: Secondary | ICD-10-CM | POA: Diagnosis not present

## 2018-04-14 DIAGNOSIS — E785 Hyperlipidemia, unspecified: Secondary | ICD-10-CM | POA: Diagnosis not present

## 2018-08-10 ENCOUNTER — Other Ambulatory Visit (HOSPITAL_COMMUNITY): Payer: Self-pay | Admitting: Internal Medicine

## 2018-08-10 DIAGNOSIS — Z1231 Encounter for screening mammogram for malignant neoplasm of breast: Secondary | ICD-10-CM

## 2018-08-11 ENCOUNTER — Encounter (HOSPITAL_COMMUNITY): Payer: Self-pay

## 2018-08-11 ENCOUNTER — Ambulatory Visit (HOSPITAL_COMMUNITY)
Admission: RE | Admit: 2018-08-11 | Discharge: 2018-08-11 | Disposition: A | Discharge status: Home / Self Care | Payer: PPO | Source: Ambulatory Visit | Attending: Internal Medicine | Admitting: Internal Medicine

## 2018-08-11 DIAGNOSIS — Z1231 Encounter for screening mammogram for malignant neoplasm of breast: Secondary | ICD-10-CM | POA: Diagnosis not present

## 2018-08-11 DIAGNOSIS — Z23 Encounter for immunization: Secondary | ICD-10-CM | POA: Diagnosis not present

## 2018-09-06 DIAGNOSIS — H524 Presbyopia: Secondary | ICD-10-CM | POA: Diagnosis not present

## 2018-09-06 DIAGNOSIS — Z961 Presence of intraocular lens: Secondary | ICD-10-CM | POA: Diagnosis not present

## 2018-10-25 DIAGNOSIS — E785 Hyperlipidemia, unspecified: Secondary | ICD-10-CM | POA: Diagnosis not present

## 2018-10-25 DIAGNOSIS — I10 Essential (primary) hypertension: Secondary | ICD-10-CM | POA: Diagnosis not present

## 2019-04-17 DIAGNOSIS — I10 Essential (primary) hypertension: Secondary | ICD-10-CM | POA: Diagnosis not present

## 2019-04-17 DIAGNOSIS — Z79899 Other long term (current) drug therapy: Secondary | ICD-10-CM | POA: Diagnosis not present

## 2019-04-17 DIAGNOSIS — E785 Hyperlipidemia, unspecified: Secondary | ICD-10-CM | POA: Diagnosis not present

## 2019-04-18 DIAGNOSIS — R7303 Prediabetes: Secondary | ICD-10-CM | POA: Diagnosis not present

## 2019-04-18 DIAGNOSIS — E785 Hyperlipidemia, unspecified: Secondary | ICD-10-CM | POA: Diagnosis not present

## 2019-04-18 DIAGNOSIS — F320 Major depressive disorder, single episode, mild: Secondary | ICD-10-CM | POA: Diagnosis not present

## 2019-04-18 DIAGNOSIS — I10 Essential (primary) hypertension: Secondary | ICD-10-CM | POA: Diagnosis not present

## 2019-09-07 DIAGNOSIS — Z961 Presence of intraocular lens: Secondary | ICD-10-CM | POA: Diagnosis not present

## 2019-09-07 DIAGNOSIS — H524 Presbyopia: Secondary | ICD-10-CM | POA: Diagnosis not present

## 2019-10-13 ENCOUNTER — Other Ambulatory Visit: Payer: Self-pay

## 2019-10-13 DIAGNOSIS — I10 Essential (primary) hypertension: Secondary | ICD-10-CM | POA: Diagnosis not present

## 2019-10-13 DIAGNOSIS — F419 Anxiety disorder, unspecified: Secondary | ICD-10-CM | POA: Diagnosis not present

## 2019-10-13 DIAGNOSIS — Z20822 Contact with and (suspected) exposure to covid-19: Secondary | ICD-10-CM

## 2019-10-14 LAB — NOVEL CORONAVIRUS, NAA: SARS-CoV-2, NAA: NOT DETECTED

## 2019-10-18 ENCOUNTER — Telehealth: Payer: Self-pay | Admitting: *Deleted

## 2019-10-18 NOTE — Telephone Encounter (Signed)
Pt calling for covid results, negative.  Pt verbalizes understanding. 

## 2020-03-19 ENCOUNTER — Encounter (INDEPENDENT_AMBULATORY_CARE_PROVIDER_SITE_OTHER): Payer: Self-pay | Admitting: *Deleted

## 2020-04-22 DIAGNOSIS — I10 Essential (primary) hypertension: Secondary | ICD-10-CM | POA: Diagnosis not present

## 2020-04-22 DIAGNOSIS — E785 Hyperlipidemia, unspecified: Secondary | ICD-10-CM | POA: Diagnosis not present

## 2020-04-22 DIAGNOSIS — R7301 Impaired fasting glucose: Secondary | ICD-10-CM | POA: Diagnosis not present

## 2020-05-02 DIAGNOSIS — C801 Malignant (primary) neoplasm, unspecified: Secondary | ICD-10-CM

## 2020-05-02 HISTORY — DX: Malignant (primary) neoplasm, unspecified: C80.1

## 2020-05-10 ENCOUNTER — Other Ambulatory Visit (HOSPITAL_COMMUNITY): Payer: Self-pay | Admitting: Internal Medicine

## 2020-05-10 ENCOUNTER — Encounter (INDEPENDENT_AMBULATORY_CARE_PROVIDER_SITE_OTHER): Payer: Self-pay | Admitting: *Deleted

## 2020-05-10 DIAGNOSIS — Z1231 Encounter for screening mammogram for malignant neoplasm of breast: Secondary | ICD-10-CM

## 2020-05-20 ENCOUNTER — Other Ambulatory Visit: Payer: Self-pay

## 2020-05-20 ENCOUNTER — Ambulatory Visit (HOSPITAL_COMMUNITY)
Admission: RE | Admit: 2020-05-20 | Discharge: 2020-05-20 | Disposition: A | Discharge status: Home / Self Care | Payer: Medicare Other | Source: Ambulatory Visit | Attending: Internal Medicine | Admitting: Internal Medicine

## 2020-05-20 DIAGNOSIS — Z1231 Encounter for screening mammogram for malignant neoplasm of breast: Secondary | ICD-10-CM | POA: Diagnosis not present

## 2020-05-23 ENCOUNTER — Other Ambulatory Visit (HOSPITAL_COMMUNITY): Payer: Self-pay | Admitting: Internal Medicine

## 2020-05-23 DIAGNOSIS — R928 Other abnormal and inconclusive findings on diagnostic imaging of breast: Secondary | ICD-10-CM

## 2020-05-28 ENCOUNTER — Other Ambulatory Visit (HOSPITAL_COMMUNITY): Payer: Self-pay | Admitting: Internal Medicine

## 2020-05-28 ENCOUNTER — Ambulatory Visit (HOSPITAL_COMMUNITY)
Admission: RE | Admit: 2020-05-28 | Discharge: 2020-05-28 | Disposition: A | Discharge status: Home / Self Care | Payer: Medicare Other | Source: Ambulatory Visit | Attending: Internal Medicine | Admitting: Internal Medicine

## 2020-05-28 ENCOUNTER — Encounter (HOSPITAL_COMMUNITY): Payer: Self-pay

## 2020-05-28 ENCOUNTER — Other Ambulatory Visit: Payer: Self-pay

## 2020-05-28 DIAGNOSIS — R928 Other abnormal and inconclusive findings on diagnostic imaging of breast: Secondary | ICD-10-CM

## 2020-05-28 DIAGNOSIS — N6322 Unspecified lump in the left breast, upper inner quadrant: Secondary | ICD-10-CM | POA: Diagnosis not present

## 2020-05-28 DIAGNOSIS — R922 Inconclusive mammogram: Secondary | ICD-10-CM | POA: Diagnosis not present

## 2020-06-11 ENCOUNTER — Other Ambulatory Visit: Payer: Self-pay

## 2020-06-11 ENCOUNTER — Ambulatory Visit (HOSPITAL_COMMUNITY)
Admission: RE | Admit: 2020-06-11 | Discharge: 2020-06-11 | Disposition: A | Discharge status: Home / Self Care | Payer: Medicare Other | Source: Ambulatory Visit | Attending: Internal Medicine | Admitting: Internal Medicine

## 2020-06-11 ENCOUNTER — Other Ambulatory Visit (HOSPITAL_COMMUNITY): Payer: Self-pay | Admitting: Internal Medicine

## 2020-06-11 DIAGNOSIS — C50211 Malignant neoplasm of upper-inner quadrant of right female breast: Secondary | ICD-10-CM | POA: Diagnosis not present

## 2020-06-11 DIAGNOSIS — R928 Other abnormal and inconclusive findings on diagnostic imaging of breast: Secondary | ICD-10-CM

## 2020-06-11 DIAGNOSIS — N6322 Unspecified lump in the left breast, upper inner quadrant: Secondary | ICD-10-CM | POA: Diagnosis not present

## 2020-06-11 DIAGNOSIS — C50212 Malignant neoplasm of upper-inner quadrant of left female breast: Secondary | ICD-10-CM | POA: Insufficient documentation

## 2020-06-11 DIAGNOSIS — Z171 Estrogen receptor negative status [ER-]: Secondary | ICD-10-CM | POA: Diagnosis not present

## 2020-06-11 MED ORDER — LIDOCAINE HCL (PF) 2 % IJ SOLN
INTRAMUSCULAR | Status: AC
  Filled 2020-06-11: qty 10

## 2020-06-20 ENCOUNTER — Other Ambulatory Visit: Payer: Self-pay | Admitting: Surgery

## 2020-06-20 DIAGNOSIS — C50912 Malignant neoplasm of unspecified site of left female breast: Secondary | ICD-10-CM

## 2020-06-20 DIAGNOSIS — Z170 Estrogen receptor positive status [ER+]: Secondary | ICD-10-CM | POA: Diagnosis not present

## 2020-06-25 ENCOUNTER — Other Ambulatory Visit: Payer: Self-pay | Admitting: Surgery

## 2020-06-25 DIAGNOSIS — C50912 Malignant neoplasm of unspecified site of left female breast: Secondary | ICD-10-CM

## 2020-06-28 ENCOUNTER — Telehealth: Payer: Self-pay | Admitting: Nurse Practitioner

## 2020-06-28 NOTE — Telephone Encounter (Signed)
Received a new pt referral from Dr. Lucia Gaskins at [facility] for new dx of breast cancer. Doris Ellis has been cld and scheduled to see [name] on 8/30 at 145pm. Pt aware to arrive 15 minutes early.

## 2020-07-01 ENCOUNTER — Encounter (HOSPITAL_BASED_OUTPATIENT_CLINIC_OR_DEPARTMENT_OTHER): Payer: Self-pay | Admitting: Surgery

## 2020-07-01 ENCOUNTER — Other Ambulatory Visit: Payer: Self-pay

## 2020-07-01 ENCOUNTER — Inpatient Hospital Stay: Payer: Medicare Other | Attending: Nurse Practitioner | Admitting: Nurse Practitioner

## 2020-07-01 VITALS — BP 135/70 | HR 69 | Temp 97.6°F | Resp 18 | Ht 64.0 in | Wt 135.1 lb

## 2020-07-01 DIAGNOSIS — C50512 Malignant neoplasm of lower-outer quadrant of left female breast: Secondary | ICD-10-CM | POA: Diagnosis not present

## 2020-07-01 DIAGNOSIS — Z170 Estrogen receptor positive status [ER+]: Secondary | ICD-10-CM

## 2020-07-01 DIAGNOSIS — F329 Major depressive disorder, single episode, unspecified: Secondary | ICD-10-CM | POA: Insufficient documentation

## 2020-07-01 DIAGNOSIS — Z90722 Acquired absence of ovaries, bilateral: Secondary | ICD-10-CM | POA: Insufficient documentation

## 2020-07-01 DIAGNOSIS — M85851 Other specified disorders of bone density and structure, right thigh: Secondary | ICD-10-CM | POA: Diagnosis not present

## 2020-07-01 DIAGNOSIS — Z807 Family history of other malignant neoplasms of lymphoid, hematopoietic and related tissues: Secondary | ICD-10-CM | POA: Diagnosis not present

## 2020-07-01 DIAGNOSIS — E2839 Other primary ovarian failure: Secondary | ICD-10-CM

## 2020-07-01 DIAGNOSIS — Z8262 Family history of osteoporosis: Secondary | ICD-10-CM | POA: Insufficient documentation

## 2020-07-01 DIAGNOSIS — Z833 Family history of diabetes mellitus: Secondary | ICD-10-CM | POA: Diagnosis not present

## 2020-07-01 DIAGNOSIS — Z8249 Family history of ischemic heart disease and other diseases of the circulatory system: Secondary | ICD-10-CM | POA: Insufficient documentation

## 2020-07-01 DIAGNOSIS — Z79899 Other long term (current) drug therapy: Secondary | ICD-10-CM | POA: Diagnosis not present

## 2020-07-01 DIAGNOSIS — F419 Anxiety disorder, unspecified: Secondary | ICD-10-CM | POA: Insufficient documentation

## 2020-07-01 DIAGNOSIS — C50212 Malignant neoplasm of upper-inner quadrant of left female breast: Secondary | ICD-10-CM | POA: Diagnosis present

## 2020-07-01 DIAGNOSIS — Z801 Family history of malignant neoplasm of trachea, bronchus and lung: Secondary | ICD-10-CM | POA: Insufficient documentation

## 2020-07-01 DIAGNOSIS — I10 Essential (primary) hypertension: Secondary | ICD-10-CM | POA: Diagnosis not present

## 2020-07-01 DIAGNOSIS — Z90710 Acquired absence of both cervix and uterus: Secondary | ICD-10-CM | POA: Diagnosis not present

## 2020-07-01 DIAGNOSIS — Z806 Family history of leukemia: Secondary | ICD-10-CM | POA: Diagnosis not present

## 2020-07-01 DIAGNOSIS — E785 Hyperlipidemia, unspecified: Secondary | ICD-10-CM | POA: Insufficient documentation

## 2020-07-01 DIAGNOSIS — Z9079 Acquired absence of other genital organ(s): Secondary | ICD-10-CM | POA: Diagnosis not present

## 2020-07-01 DIAGNOSIS — C50219 Malignant neoplasm of upper-inner quadrant of unspecified female breast: Secondary | ICD-10-CM | POA: Insufficient documentation

## 2020-07-01 NOTE — Progress Notes (Addendum)
[facility]  [phone] Fax:([phone] [place] Note   Patient Care Team: Asencion Noble, MD as PCP - General (Internal Medicine) Rockwell Germany, RN as Oncology Nurse Navigator Mauro Kaufmann, RN as Oncology Nurse Navigator Alphonsa Overall, MD as Consulting Physician (General Surgery) Kyung Rudd, MD as Consulting Physician (Radiation Oncology) Truitt Merle, MD as Consulting Physician (Hematology) Alla Feeling, NP as Nurse Practitioner (Nurse Practitioner) Date of service: 07/01/2020   CHIEF COMPLAINTS/PURPOSE OF CONSULTATION:  Left breast cancer, referred by Dr. Lucia Gaskins  Oncology History  Malignant neoplasm of upper-inner quadrant of female breast ([place])  05/28/2020 Mammogram   Diagnostic left mammogram/US  -On physical exam, I palpate discrete focal soft thickening in the 10-11 o'clock location of the LEFT breast. -Targeted ultrasound is performed, showing an irregular taller than wide hypoechoic mass with indistinct margins in the 10 o'clock location of the LEFT breast 8 centimeters from the nipple. There is associated posterior acoustic shadowing. No significant internal blood flow identified on Doppler evaluation.  IMPRESSION: Suspicious mass in the 10 o'clock location of the LEFT breast. No LEFT axillary adenopathy.     06/11/2020 Cancer Staging   Staging form: Breast, AJCC 8th Edition - Clinical stage from 06/11/2020: Stage IA (cT1b, cN0, cM0, G2, ER+, PR-, HER2: Equivocal) - Signed by Alla Feeling, NP on 07/01/2020   07/01/2020 Initial Diagnosis   Malignant neoplasm of upper-inner quadrant of female breast ([place])   07/01/2020 Initial Biopsy   FINAL MICROSCOPIC DIAGNOSIS: A. BREAST, 10:00, LEFT, BIOPSY: - Invasive ductal carcinoma The carcinoma appears Nottingham grade 1-2 of 3 and measures 0.9 cm in greatest linear extent  PROGNOSTIC INDICATOR RESULTS: The tumor cells are EQUIVOCAL for Her2 (2+) Estrogen Receptor: 90%,  MODERATE STAINING INTENSITY Progesterone Receptor: NEGATIVE Proliferation Marker Ki-67: 5%  FLOURESCENCE IN-SITU HYBRIDIZATION RESULTS: GROUP 5: HER2 **NEGATIVE** RATIO of HER2/CEN 17 SIGNALS: 1.18 AVERAGE HER2 COPY NUMBER PER CELL: 1.30     HISTORY OF PRESENTING ILLNESS:  Doris Ellis 79 y.o. female with history of hypertension, recurrent UTIs, and anxiety is here because of newly diagnosed left breast cancer.  Her last mammogram on 08/11/2018 was negative.  She had a prior left breast lumpectomy in her 7s which was benign and has had several other breast biopsies which she reports showed fibrocystic changes. She presented for routine screening mammogram on 05/20/2020 which showed breast density category C and a possible distortion in the left breast.  No suspicious findings for malignancy in the right breast.  A diagnostic left mammogram and ultrasound on 05/28/2020 showed an irregular hypoechoic mass with indistinct margins in the 10:00 location of the left breast 8 cmfn, left axilla is negative for adenopathy.  She underwent biopsy on 06/11/2020, path showed invasive ductal carcinoma measuring 0.9 cm, Nottingham grade 1-2 of 3.  Prognostic panel showed 90% estrogen positive with moderate staining, progesterone negative, HER-2 equivocal (2+), negative by FISH, with a proliferation marker Ki-67 of 5%.  She was seen by Dr. Lucia Gaskins at [facility] who is planning a left breast lumpectomy with seed localization and left axillary sentinel lymph node biopsy on 07/05/2020.  She has a radiation oncology consult with Dr. Lisbeth Renshaw on 07/04/2020.  GYN HISTORY  Menarchal: age 35 LMP: 84 (age 64) with complete hysterectomy HRT: from age 22 to 78 G2P2  Socially, she is single, lives alone with her 6 cats.  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  She is  independent and active, walks at least a mile daily and does her own yard work.  Her son lives in [place] and daughter lives in  [place], both healthy.  She drinks alcohol socially and occasionally smokes marijuana.  Denies tobacco use.  She is due for a screening colonoscopy.  Her father had non-Hodgkin's lymphoma and CLL, a cousin died of breast cancer in her 36s.  Today, she presents to clinic by herself.  She did not palpate the mass herself.  She has recovered from the biopsy. She has occasional cough from allergies. Denies decreased appetite or unintentional weight loss, fatigue, change in bowel habits, recent fever, chills, chest pain, dyspnea, bone or other pain.  Her mood is stable.     MEDICAL HISTORY:  Past Medical History:  Diagnosis Date  . Anxiety   . Arthritis   . Cancer ([place]) 05/2020   left breast IDC  . Concussion 1985  . Depression   . Hyperlipidemia   . Mild concussion 2002   tripped over bag at work  . Osteopenia 07/2011   T score -1.8 FRAX 11%/1.5%  . Panic attacks   . Seasonal allergies     SURGICAL HISTORY: Past Surgical History:  Procedure Laterality Date  . ABDOMINAL HYSTERECTOMY  april 1990   TAH BSO  . BACK SURGERY  1996   ruputured disc L-5  . BLADDER SUSPENSION  1982  . BREAST BIOPSY Left   . BREAST SURGERY  1984   cyst-benign  . COLONOSCOPY N/A 03/30/2013   Procedure: COLONOSCOPY;  Surgeon: Rogene Houston, MD;  Location: [facility];  Service: Endoscopy;  Laterality: N/A;  930  . DILATION AND CURETTAGE OF UTERUS  12/09/1988   abnormal uterine bleeding    SOCIAL HISTORY: Social History   Socioeconomic History  . Marital status: Divorced    Spouse name: Not on file  . Number of children: Not on file  . Years of education: Not on file  . Highest education level: Not on file  Occupational History  . Not on file  Tobacco Use  . Smoking status: Never Smoker  . Smokeless tobacco: Never Used  Substance and Sexual Activity  . Alcohol use: Yes    Comment: social  . Drug use: No  . Sexual activity: Not Currently    Partners: Male    Birth control/protection:  Surgical    Comment: hyst  Other Topics Concern  . Not on file  Social History Narrative  . Not on file   Social Determinants of Health   Financial Resource Strain:   . Difficulty of Paying Living Expenses: Not on file  Food Insecurity:   . Worried About Charity fundraiser in the Last Year: Not on file  . Ran Out of Food in the Last Year: Not on file  Transportation Needs:   . Lack of Transportation (Medical): Not on file  . Lack of Transportation (Non-Medical): Not on file  Physical Activity:   . Days of Exercise per Week: Not on file  . Minutes of Exercise per Session: Not on file  Stress:   . Feeling of Stress : Not on file  Social Connections:   . Frequency of Communication with Friends and Family: Not on file  . Frequency of Social Gatherings with Friends and Family: Not on file  . Attends Religious Services: Not on file  . Active Member of Clubs or Organizations: Not on file  . Attends Archivist Meetings: Not on file  .  Marital Status: Not on file  Intimate Partner Violence:   . Fear of Current or Ex-Partner: Not on file  . Emotionally Abused: Not on file  . Physically Abused: Not on file  . Sexually Abused: Not on file    FAMILY HISTORY: Family History  Problem Relation Age of Onset  . Cancer Father        non-hogkins lymphoma, & CLL  . Diabetes Maternal Uncle   . Hypertension Maternal Grandmother   . Heart disease Maternal Grandfather   . Cancer Maternal Grandfather        lung cancer  . Osteoporosis Maternal Aunt     ALLERGIES:  is allergic to benadryl [diphenhydramine], lipitor [atorvastatin calcium], and zocor [simvastatin - high dose].  MEDICATIONS:  Current Outpatient Medications  Medication Sig Dispense Refill  . ALPRAZolam (XANAX) 1 MG tablet Take 1 mg by mouth at bedtime as needed for sleep.    Marland Kitchen amLODipine (NORVASC) 2.5 MG tablet Take 2.5 mg by mouth daily.    . cyclobenzaprine (FLEXERIL) 10 MG tablet Take 10 mg by mouth 3 (three)  times daily as needed (muscle spasms).     . fluticasone (FLONASE) 50 MCG/ACT nasal spray Place into both nostrils daily.    . multivitamin (THERAGRAN) per tablet Take 1 tablet by mouth daily.      . naproxen sodium (ALEVE) 220 MG tablet Take 220 mg by mouth.    . pravastatin (PRAVACHOL) 10 MG tablet Take 10 mg by mouth daily.      . Pyridoxine HCl (VITAMIN B-6 PO) Take 1 tablet by mouth daily.     . sertraline (ZOLOFT) 50 MG tablet Take 50 mg by mouth daily.     No current facility-administered medications for this visit.    REVIEW OF SYSTEMS:   Constitutional: Denies fevers, chills or abnormal night sweats Eyes: Denies blurriness of vision, double vision or watery eyes Ears, nose, mouth, throat, and face: Denies mucositis or sore throat Respiratory: Denies cough, dyspnea or wheezes Cardiovascular: Denies palpitation, chest discomfort or lower extremity swelling Gastrointestinal:  Denies nausea, heartburn or change in bowel habits Skin: Denies abnormal skin rashes Lymphatics: Denies new lymphadenopathy or easy bruising Neurological:Denies numbness, tingling or new weaknesses Behavioral/Psych: Mood is stable, no new changes  All other systems were reviewed with the patient and are negative.  PHYSICAL EXAMINATION: ECOG PERFORMANCE STATUS: 0 - Asymptomatic  Vitals:   07/01/20 1344  BP: 135/70  Pulse: 69  Resp: 18  Temp: 97.6 F (36.4 C)  SpO2: 99%   Filed Weights   07/01/20 1344  Weight: 135 lb 1.6 oz (61.3 kg)    GENERAL:alert, no distress and comfortable SKIN: No rash to exposed skin EYES: sclera clear NECK: Without mass LYMPH:  no palpable cervical or supraclavicular lymphadenopathy  LUNGS:  normal breathing effort HEART:  no lower extremity edema PSYCH: alert & oriented x 3 with fluent speech NEURO: no focal motor/sensory deficits Breast exam: Fullness in the left upper inner quadrant, no other palpable mass in either breast or axilla that I could  appreciate  LABORATORY DATA:  I have reviewed the data as listed No flowsheet data found.   RADIOGRAPHIC STUDIES: I have personally reviewed the radiological images as listed and agreed with the findings in the report. MM CLIP PLACEMENT LEFT  Result Date: 06/11/2020 CLINICAL DATA:  Evaluate RIBBON clip placement following ultrasound-guided LEFT breast biopsy. EXAM: 3D DIAGNOSTIC LEFT MAMMOGRAM POST ULTRASOUND BIOPSY COMPARISON:  Previous exam(s). FINDINGS: 3D Mammographic images were obtained following  ultrasound guided biopsy of the 0.6 cm mass within the UPPER INNER LEFT breast. The RIBBON biopsy marking clip is in expected position at the site of biopsy. IMPRESSION: Appropriate positioning of the RIBBON shaped biopsy marking clip at the site of biopsy in the UPPER INNER LEFT breast. Final Assessment: Post Procedure Mammograms for Marker Placement Electronically Signed   By: Margarette Canada M.D.   On: 06/11/2020 08:57   [place] LT BREAST BX W LOC DEV 1ST LESION IMG BX SPEC US GUIDE  Addendum Date: 06/13/2020   ADDENDUM REPORT: 06/13/2020 14:37 ADDENDUM: PATHOLOGY revealed: A. BREAST, 10:00, LEFT, BIOPSY: - INVASIVE DUCTAL CARCINOMA. Comment: The biopsy material shows an infiltrative proliferation of cells arranged linearly and in small clusters. Based on the biopsy, the carcinoma appears Nottingham grade 1-2 of 3 and measures 0.9 cm in greatest linear extent. Pathology results are CONCORDANT with imaging findings, per Dr. Hassan Rowan. Pathology results and recommendations below were discussed with patient by telephone on 06/13/2020. Patient reported biopsy site within normal limits with slight tenderness at the site. Post biopsy care instructions were reviewed, questions were answered and my direct phone number was provided to patient. Patient was instructed to call [facility] Mammography Department if any concerns or questions arise related to the biopsy. Recommendation: Surgical referral.  Request for surgical referral was relayed to Kathi Der RT at [facility] Mammography Department by Electa Sniff RN on 06/13/2020. Addendum by Electa Sniff RN on 06/13/2020. Electronically Signed   By: Margarette Canada M.D.   On: 06/13/2020 14:37   Result Date: 06/13/2020 CLINICAL DATA:  79 year old female for tissue sampling of UPPER INNER LEFT breast mass. EXAM: ULTRASOUND GUIDED LEFT BREAST CORE NEEDLE BIOPSY COMPARISON:  Previous exam(s). PROCEDURE: I met with the patient and we discussed the procedure of ultrasound-guided biopsy, including benefits and alternatives. We discussed the high likelihood of a successful procedure. We discussed the risks of the procedure, including infection, bleeding, tissue injury, clip migration, and inadequate sampling. Informed written consent was given. The usual time-out protocol was performed immediately prior to the procedure. Lesion quadrant: UPPER INNER LEFT breast Using sterile technique and 1% Lidocaine as local anesthetic, under direct ultrasound visualization, a 14 gauge spring-loaded device was used to perform biopsy of the 0.6 cm mass at the 10 o'clock position of the LEFT breast 8 cm from the nipple using a MEDIAL approach. At the conclusion of the procedure a RIBBON tissue marker clip was deployed into the biopsy cavity. Follow up 2 view mammogram was performed and dictated separately. IMPRESSION: Ultrasound guided biopsy of 0.6 cm UPPER INNER LEFT breast mass. No apparent complications. Electronically Signed: By: Margarette Canada M.D. On: 06/11/2020 08:53    ASSESSMENT & PLAN: 79 year old postmenopausal female with    1. 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  The size of the tumor is not well-defined on mammogram/ultrasound, but is 0.9 cm and the path report, this is at least T1b, and low Ki-67.  This likely  behaves like low risk breast cancer. -Giving the early stage disease, she is planning to have left lumpectomy with sentinel lymph node biopsy by Dr. Lucia Gaskins which is scheduled on 07/05/2020  -We discussed Oncotype testing on the surgical sample if the tumor is greater than 1 cm to help stratify her recurrence risk and to help make a decision about  adjuvant chemotherapy. Written material of this test was given to her. She is elderly with limited social support, but no significant comorbidities.  Will see her back after surgery to discuss chemotherapy if her Oncotype recurrence score is high. -If her surgical sentinel lymph node node positive, will recommend mammaprint for further risk stratification and guide adjuvant chemotherapy. -Given the moderate ER expression in her postmenopausal status, we are recommending adjuvant endocrine therapy with tamoxifen or aromatase inhibitor for a total of 5-10 years to reduce the risk of cancer recurrence. Potential benefits and side effects which include but not limited to hot flash, skin and vaginal dryness, metabolic changes ( increased blood glucose, cholesterol, weight, etc.), slightly in increased risk of cardiovascular disease, cataracts, muscular and joint discomfort, osteopenia and osteoporosis, etc, were discussed with her in great detail, she is interested. Plan to start after radiation.  -plan to start AI unless new baseline dexa shows severe osteoporosis.  -She will be seen by radiation oncologist Dr. Lisbeth Renshaw on 07/04/20.  -We also discussed the breast cancer surveillance after her surgery. She will continue annual screening mammogram, self exam, and a routine office visit with lab and exam with [place]. -I encouraged her to have healthy diet and exercise regularly.  -Doris. [place] appears well today, she recovered from left breast biopsy.  Exam shows fullness in the upper inner left breast. -Recent labs from [facility] on 04/22/2020 show a normal CMP and CBC without  differential, I will scan these in to her chart -I have asked our breast nurse navigator's to reach out and follow her case -We will follow-up after radiation unless Oncotype shows high risk  2. Bone health  -DEXA on 04/01/2017 shows osteopenia in the right femur neck with T score -1.6.  Her FRAX score shows a 16.9% probability of a major osteoporotic fracture in 10 years and 3.1% risk for hip fracture -We encouraged her to begin calcium and vitamin D once daily and continue weightbearing exercise -We plan to prescribe AI unless repeat DEXA shows worsening osteopenia or osteoporosis, will obtain prior to starting antiestrogens  PLAN: -medical record, imaging and path reviewed -proceed with rad onc consult on 9/2 and lumpectomy on 9/3 per Dr. Lucia Gaskins -will order oncotype if tumor > 1 cm on final path -f/u after adjuvant RT unless oncotype shows high risk  -repeat DEXA prior to starting anti-estrogen -begin calcium and Vitamin D    Orders Placed This Encounter  Procedures  . DG Bone Density    Standing Status:   Future    Standing Expiration Date:   07/01/2021    Order Specific Question:   Reason for Exam (SYMPTOM  OR DIAGNOSIS REQUIRED)    Answer:   osteopenia    Order Specific Question:   Preferred imaging location?    Answer:   [facility] with Differential ([facility] Only)    Standing Status:   Standing    Number of Occurrences:   10    Standing Expiration Date:   07/02/2021  . CMP ([facility] only)    Standing Status:   Standing    Number of Occurrences:   10    Standing Expiration Date:   07/02/2021    All questions were answered. The patient knows to call the clinic with any problems, questions or concerns.     Alla Feeling, NP 07/02/2020   Addendum  I have seen the patient, examined her. I agree with the assessment and and plan and have  edited the notes.   Doris Ellis is a 79 yo female with screening discover early stage I breast cancer, which is  ER positive, PR and HER-2 negative.  She is scheduled for lumpectomy and sentinel lymph node biopsy in a few weeks.  We discussed the risk of recurrence after surgery, and role of Oncotype for risk stratification. She is elderly but lives independently, not a good candidate for intensive chemo, but she is interested in knowing her risk of recurrence.  We discussed the benefit of antiestrogen therapy, and I encouraged her to try.  Her bone density scan 3 years ago showed osteopenia, will repeat in the next few months before starting her on antiestrogen therapy.  If she has worsening osteopenia or has osteoporosis, I would recommend tamoxifen.  We discussed role of adjuvant radiation.  We discussed cancer surveillance. Plan to see her back after surgery or radiation. All questions were answered to her satisfaction.   Truitt Merle 07/01/2020

## 2020-07-02 ENCOUNTER — Encounter: Payer: Self-pay | Admitting: Nurse Practitioner

## 2020-07-02 ENCOUNTER — Telehealth: Payer: Self-pay | Admitting: *Deleted

## 2020-07-02 ENCOUNTER — Encounter: Payer: Self-pay | Admitting: *Deleted

## 2020-07-02 ENCOUNTER — Ambulatory Visit: Payer: Medicare Other | Attending: Surgery | Admitting: Physical Therapy

## 2020-07-02 ENCOUNTER — Other Ambulatory Visit (HOSPITAL_COMMUNITY)
Admission: RE | Admit: 2020-07-02 | Discharge: 2020-07-02 | Disposition: A | Discharge status: Home / Self Care | Payer: Medicare Other | Source: Ambulatory Visit | Attending: Surgery | Admitting: Surgery

## 2020-07-02 ENCOUNTER — Encounter (HOSPITAL_BASED_OUTPATIENT_CLINIC_OR_DEPARTMENT_OTHER)
Admission: RE | Admit: 2020-07-02 | Discharge: 2020-07-02 | Disposition: A | Discharge status: Home / Self Care | Payer: Medicare Other | Source: Ambulatory Visit | Attending: Surgery | Admitting: Surgery

## 2020-07-02 DIAGNOSIS — Z01812 Encounter for preprocedural laboratory examination: Secondary | ICD-10-CM | POA: Diagnosis not present

## 2020-07-02 DIAGNOSIS — Z170 Estrogen receptor positive status [ER+]: Secondary | ICD-10-CM | POA: Insufficient documentation

## 2020-07-02 DIAGNOSIS — R293 Abnormal posture: Secondary | ICD-10-CM | POA: Diagnosis not present

## 2020-07-02 DIAGNOSIS — Z20822 Contact with and (suspected) exposure to covid-19: Secondary | ICD-10-CM | POA: Insufficient documentation

## 2020-07-02 DIAGNOSIS — C50912 Malignant neoplasm of unspecified site of left female breast: Secondary | ICD-10-CM | POA: Insufficient documentation

## 2020-07-02 DIAGNOSIS — Z01810 Encounter for preprocedural cardiovascular examination: Secondary | ICD-10-CM | POA: Insufficient documentation

## 2020-07-02 LAB — SARS CORONAVIRUS 2 (TAT 6-24 HRS): SARS Coronavirus 2: NEGATIVE

## 2020-07-02 NOTE — Telephone Encounter (Signed)
Spoke with .patient to follow up from her new patient appointment and assess navigation resources and give her my contact information. Encouraged her to call with any needs or concerns.  She states her daughter is coming in to help her through the sx

## 2020-07-02 NOTE — Patient Instructions (Signed)

## 2020-07-02 NOTE — Progress Notes (Signed)
      Enhanced Recovery after Surgery for Orthopedics Enhanced Recovery after Surgery is a protocol used to improve the stress on your body and your recovery after surgery.  Patient Instructions  . The night before surgery:  o No food after midnight. ONLY clear liquids after midnight  . The day of surgery (if you do NOT have diabetes):  o Drink ONE (1) Pre-Surgery Clear Ensure as directed.   o This drink was given to you during your hospital  pre-op appointment visit. o The pre-op nurse will instruct you on the time to drink the  Pre-Surgery Ensure depending on your surgery time. o Finish the drink at the designated time by the pre-op nurse.  o Nothing else to drink after completing the  Pre-Surgery Clear Ensure.  . The day of surgery (if you have diabetes): o Drink ONE (1) Gatorade 2 (G2) as directed. o This drink was given to you during your hospital  pre-op appointment visit.  o The pre-op nurse will instruct you on the time to drink the   Gatorade 2 (G2) depending on your surgery time. o Color of the Gatorade may vary. Red is not allowed. o Nothing else to drink after completing the  Gatorade 2 (G2).         If you have questions, please contact your surgeon's office.  Pt given surgical soap and instructions, verbalized understandings.

## 2020-07-02 NOTE — Therapy (Signed)
[place], [place], [address] Phone: [phone]   Fax:  [phone]  Physical Therapy Evaluation  Patient Details  Name: Doris Ellis MRN: [id number] Date of Birth: 08-10-1941 Referring Provider (PT): Dr. Lucia Gaskins    Encounter Date: 07/02/2020   PT End of Session - 07/02/20 1739    Visit Number 1    Number of Visits 2    Date for PT Re-Evaluation 11/01/20    PT Start Time 1500    PT Stop Time 1545    PT Time Calculation (min) 45 min    Activity Tolerance Patient tolerated treatment well    Behavior During Therapy [facility] for tasks assessed/performed           Past Medical History:  Diagnosis Date  . Anxiety   . Arthritis   . Cancer ([place]) 05/2020   left breast IDC  . Concussion 1985  . Depression   . Hyperlipidemia   . Mild concussion 2002   tripped over bag at work  . Osteopenia 07/2011   T score -1.8 FRAX 11%/1.5%  . Panic attacks   . Seasonal allergies     Past Surgical History:  Procedure Laterality Date  . ABDOMINAL HYSTERECTOMY  april 1990   TAH BSO  . BACK SURGERY  1996   ruputured disc L-5  . BLADDER SUSPENSION  1982  . BREAST BIOPSY Left   . BREAST SURGERY  1984   cyst-benign  . COLONOSCOPY N/A 03/30/2013   Procedure: COLONOSCOPY;  Surgeon: Rogene Houston, MD;  [facility];  Service: Endoscopy;  Laterality: N/A;  930  . DILATION AND CURETTAGE OF UTERUS  12/09/1988   abnormal uterine bleeding    There were no vitals filed for this visit.    Subjective Assessment - 07/02/20 1511    Subjective Pt states this has all come on her all of a sudden. She is planning to have surgery on Friday.    Pertinent History 06/11/2020: dianosed with neoplasm on left breast, ER+, PR-, HER2-, Ki67 of 5% planning lumpectomy and sentinel node biopsy on 07/05/2020. past history of low back ruptured disc with surgery in 1995 so her back hurts every now and then, past history of lumpectomy  in 1985 that was benign    Patient Stated Goals to learn what she needs to knoe    Currently in Pain? No/denies              [facility] PT Assessment - 07/02/20 0001      Assessment   Medical Diagnosis left breast cancer     Referring Provider (PT) Dr. Lucia Gaskins     Onset Date/Surgical Date 07/05/20    Hand Dominance Right      Precautions   Precautions Other (comment)    Precaution Comments will be at risk for lymphedema       Restrictions   Weight Bearing Restrictions No      Balance Screen   Has the patient fallen in the past 6 months Yes    How many times? once    backed up off a step, lost balance    Has the patient had a decrease in activity level because of a fear of falling?  No    Is the patient reluctant to leave their home because of a fear of falling?  No      Home Social worker Private residence    Living Arrangements Alone  Available Help at Discharge Family;Available 24 hours/day      Prior Function   Level of Independence Independent    Vocation Part time employment    Product manager, art     Leisure does yoga, does stretching exercises daily, walks her dog 2 times a day       Cognition   Overall Cognitive Status Within Functional Limits for tasks assessed      Observation/Other Assessments   Observations Pt comes in to PT walking easily, appears fit with good muscle mass in upper body      Coordination   Gross Motor Movements are Fluid and Coordinated Yes      Posture/Postural Control   Posture/Postural Control Postural limitations    Postural Limitations Rounded Shoulders;Forward head;Increased thoracic kyphosis      ROM / Strength   AROM / PROM / Strength AROM;Strength      AROM   Overall AROM  Within functional limits for tasks performed    AROM Assessment Site Shoulder    Right/Left Shoulder Right;Left    Right Shoulder Flexion 165 Degrees    Right Shoulder ABduction 170 Degrees    Right Shoulder External  Rotation 90 Degrees    Left Shoulder Flexion 165 Degrees    Left Shoulder ABduction 170 Degrees    Left Shoulder External Rotation 90 Degrees      Strength   Overall Strength Within functional limits for tasks performed             LYMPHEDEMA/ONCOLOGY QUESTIONNAIRE - 07/02/20 0001      Right Upper Extremity Lymphedema   10 cm Proximal to Olecranon Process 27 cm    Olecranon Process 23 cm    15 cm Proximal to Ulnar Styloid Process 23 cm    Just Proximal to Ulnar Styloid Process 15 cm    Across Hand at [employer] 18.5 cm    At [place] of 2nd Digit 6 cm      Left Upper Extremity Lymphedema   10 cm Proximal to Olecranon Process 27 cm    Olecranon Process 23.5 cm    15 cm Proximal to Ulnar Styloid Process 23 cm    Just Proximal to Ulnar Styloid Process 15 cm    Across Hand at [employer] 18 cm    At [place] of 2nd Digit 5.5 cm           L-DEX FLOWSHEETS - 07/02/20 1500      L-DEX LYMPHEDEMA SCREENING   Measurement Type Unilateral    L-DEX MEASUREMENT EXTREMITY Upper Extremity    POSITION  Standing    DOMINANT SIDE Right    At Risk Side Left    BASELINE SCORE (UNILATERAL) 0.1           The patient was assessed using the L-Dex machine today to produce a lymphedema index baseline score. The patient will be reassessed on a regular basis (typically every 3 months) to obtain new L-Dex scores. If the score is > 6.5 points away from his/her baseline score indicating onset of subclinical lymphedema, it will be recommended to wear a compression garment for 4 weeks, 12 hours per day and then be reassessed. If the score continues to be > 6.5 points from baseline at reassessment, we will initiate lymphedema treatment. Assessing in this manner has a 95% rate of preventing clinically significant lymphedema.        Objective measurements completed on examination: See above findings.  Breast Clinic Goals - 07/02/20 1752      Patient will be able to verbalize  understanding of pertinent lymphedema risk reduction practices relevant to her diagnosis specifically related to skin care.   Time 1    Period Days    Status Achieved      Patient will be able to return demonstrate and/or verbalize understanding of the post-op home exercise program related to regaining shoulder range of motion.   Time 1    Period Days    Status Achieved      Patient will be able to verbalize understanding of the importance of attending the postoperative After Breast Cancer Class for further lymphedema risk reduction education and therapeutic exercise.   Time 1    Period Days    Status Achieved                 Plan - 07/02/20 1739    Clinical Impression Statement Pt is a 79 yo fit female with recently disgnosed left breat cancer who comes in for prospective surveillence baseline measurements and instruction in post op exercises.  She understood all and is prepared to have surgery in 3 days.    Personal Factors and Comorbidities Comorbidity 2    Comorbidities previous low back sugery, previous left breast surgery    Stability/Clinical Decision Making Stable/Uncomplicated    Clinical Decision Making Low    Rehab Potential Excellent    PT Frequency --   post op check in 3 weeks   PT Next Visit Plan reassess, SOZO check in December 2021    Consulted and Agree with Plan of Care Patient          Patient will follow up at outpatient cancer rehab 3-4 weeks following surgery.  If the patient requires physical therapy at that time, a specific plan will be dictated and sent to the referring physician for approval. The patient was educated today on appropriate basic range of motion exercises to begin post operatively and the importance of attending the After Breast Cancer class following surgery.  Patient was educated today on lymphedema risk reduction practices as it pertains to recommendations that will benefit the patient immediately following surgery.  She verbalized  good understanding.    .    Patient will benefit from skilled therapeutic intervention in order to improve the following deficits and impairments:  Postural dysfunction, Decreased knowledge of precautions  Visit Diagnosis: Abnormal posture - Plan: PT plan of care cert/re-cert  Malignant neoplasm of left breast in female, estrogen receptor positive, unspecified site of breast ([place]) - Plan: PT plan of care cert/re-cert     Problem List Patient Active Problem List   Diagnosis Date Noted  . Malignant neoplasm of upper-inner quadrant of female breast ([place]) 07/01/2020  . Hyperlipidemia   . Concussion   . Mild concussion    Donato Heinz. Owens Shark PT  Norwood Levo 07/02/2020, 5:56 PM  [place], [place], [address] Phone: [phone]   Fax:  [phone]  Name: CHERRIE FRANCA MRN: [id number] Date of Birth: February 12, 1941

## 2020-07-03 ENCOUNTER — Telehealth: Payer: Self-pay | Admitting: Nurse Practitioner

## 2020-07-03 NOTE — Progress Notes (Signed)
Location of Breast Cancer: Malignant neoplasm of Upper Inner Quadrant of Left Breast  Did patient present with symptoms (if so, please note symptoms) or was this found on screening mammography?: Routine mammogram  Mammogram/Ultrasound 05/28/2020: Irregular mass at 10 o'clock position in the left breast.  Histology per Pathology Report: Left Breast 06/11/2020  Receptor Status: ER(+ 90%), PR (-), Her2-neu (-), Ki67-(5%)    Past/Anticipated interventions by surgeon, if any: Dr. Lucia Gaskins 06/20/2020 -Medical and radiation oncology referral. -PT consultation -Left breast Lumpectomy with radioactive seed and axillary SLN biopsy.   Past/Anticipated interventions by medical oncology, if any: Chemotherapy  NP Kalman Shan 07/01/2020 -We discussed her imaging findings and the biopsy results in great details.  The size of the tumor is not well-defined on mammogram/ultrasound, but is 0.9 cm and the path report, this is at least T1b, and low Ki-67.  This likely behaves like low risk breast cancer. -Giving the early stage disease, she is planning to have left lumpectomy with sentinel lymph node biopsy by Dr. Lucia Gaskins which is scheduled on 07/05/2020  -We discussed Oncotype testing on the surgical sample if the tumor is greater than 1 cm to help stratify her recurrence risk and to help make a decision about adjuvant chemotherapy.  --If her surgical sentinel lymph node node positive, will recommend mammaprint for further risk stratification and guide adjuvant chemotherapy. -Given the moderate ER expression in her postmenopausal status, we are recommending adjuvant endocrine therapy with tamoxifen or aromatase inhibitor for a total of 5-10 years to reduce the risk of cancer recurrence.   Lymphedema issues, if any:  Had mild swelling at biopsy site.  Pain issues, if any:  No  SAFETY ISSUES:  Prior radiation? No  Pacemaker/ICD? No  Possible current pregnancy? Hysterectomy  Is the patient on methotrexate?  No  Current Complaints / other details:   -Previous left breast lumpectomy in her 50's for benign disease.     Cori Razor, RN 07/03/2020,8:13 AM

## 2020-07-03 NOTE — Telephone Encounter (Signed)
No 8/30 los

## 2020-07-04 ENCOUNTER — Ambulatory Visit
Admission: RE | Admit: 2020-07-04 | Discharge: 2020-07-04 | Disposition: A | Discharge status: Home / Self Care | Payer: Medicare Other | Source: Ambulatory Visit | Attending: Surgery | Admitting: Surgery

## 2020-07-04 ENCOUNTER — Other Ambulatory Visit: Payer: Self-pay

## 2020-07-04 ENCOUNTER — Ambulatory Visit
Admission: RE | Admit: 2020-07-04 | Discharge: 2020-07-04 | Disposition: A | Discharge status: Home / Self Care | Payer: Medicare Other | Source: Ambulatory Visit | Attending: Radiation Oncology | Admitting: Radiation Oncology

## 2020-07-04 ENCOUNTER — Encounter: Payer: Self-pay | Admitting: General Practice

## 2020-07-04 ENCOUNTER — Encounter: Payer: Self-pay | Admitting: Radiation Oncology

## 2020-07-04 VITALS — Ht 64.0 in | Wt 133.0 lb

## 2020-07-04 DIAGNOSIS — Z170 Estrogen receptor positive status [ER+]: Secondary | ICD-10-CM

## 2020-07-04 DIAGNOSIS — C50212 Malignant neoplasm of upper-inner quadrant of left female breast: Secondary | ICD-10-CM

## 2020-07-04 NOTE — Progress Notes (Deleted)
Screening detected mass in the left breast.  She subsequently underwent

## 2020-07-04 NOTE — Progress Notes (Signed)
Radiation Oncology         [phone] ________________________________  Initial Outpatient Consultation - Conducted via telephone due to current COVID-19 concerns for limiting patient exposure  I spoke with the patient to conduct this consult visit via telephone to spare the patient unnecessary potential exposure in the healthcare setting during the current COVID-19 pandemic. The patient was notified in advance and was offered a WebEX meeting to allow for face to face communication but unfortunately reported that they did not have the appropriate resources/technology to support such a visit and instead preferred to proceed with a telephone consult.    Name: Doris Ellis        MRN: [id number]  Date of Service: 07/04/2020 DOB: 08/07/1941  CC:Fagan, Roy, MD  Newman, David, MD     REFERRING PHYSICIAN: Newman, David, MD   DIAGNOSIS: The encounter diagnosis was Malignant neoplasm of upper-inner quadrant of left breast in female, estrogen receptor positive ([facility]).   HISTORY OF PRESENT ILLNESS: Doris Ellis is a 79 y.o. female with a newly  left breast cancer. She screening detected mass in the left breast.  She underwent diagnostic imaging which revealed a mass at 10:00, measurements were not given however on ultrasound she did not have any axillary adenopathy.  She underwent a biopsy on 06/11/2020, this revealed a grade 1-2 invasive ductal carcinoma that measured 9 mm.  Her tumor was ER positive, PR and HER-2 negative with a Ki-67 of 5%.  She has met with Dr. Newman and Dr. Feng and is planning to undergo lumpectomy on Friday of this week.  She is contacted today by telephone to discuss treatment recommendations following surgery.   PREVIOUS RADIATION THERAPY: No   PAST MEDICAL HISTORY:  Past Medical History:  Diagnosis Date  . Anxiety   . Arthritis   . Cancer ([facility]) 05/2020   left breast IDC  . Concussion 1985  . Depression   . Hyperlipidemia   . Mild concussion 2002   tripped over  bag at work  . Osteopenia 07/2011   T score -1.8 FRAX 11%/1.5%  . Panic attacks   . Seasonal allergies        PAST SURGICAL HISTORY: Past Surgical History:  Procedure Laterality Date  . ABDOMINAL HYSTERECTOMY  april 1990   TAH BSO  . BACK SURGERY  1996   ruputured disc L-5  . BLADDER SUSPENSION  1982  . BREAST BIOPSY Left   . BREAST SURGERY  1984   cyst-benign  . COLONOSCOPY N/A 03/30/2013   Procedure: COLONOSCOPY;  Surgeon: Najeeb U Rehman, MD;  Location[facility];  Service: Endoscopy;  Laterality: N/A;  930  . DILATION AND CURETTAGE OF UTERUS  12/09/1988   abnormal uterine bleeding     FAMILY HISTORY:  Family History  Problem Relation Age of Onset  . Cancer Father        non-hogkins lymphoma, & CLL  . Diabetes Maternal Uncle   . Hypertension Maternal Grandmother   . Heart disease Maternal Grandfather   . Cancer Maternal Grandfather        lung cancer  . Osteoporosis Maternal Aunt      SOCIAL HISTORY:  reports that she has never smoked. She has never used smokeless tobacco. She reports current alcohol use. She reports current drug use. Drug: Marijuana. The patient is divorced and lives in [place].   ALLERGIES: Benadryl [diphenhydramine], Lipitor [atorvastatin calcium], and Zocor [simvastatin - high dose]   MEDICATIONS:  Current Outpatient Medications  Medication   Sig Dispense Refill  . ALPRAZolam (XANAX) 1 MG tablet Take 1 mg by mouth at bedtime as needed for sleep.    Marland Kitchen amLODipine (NORVASC) 2.5 MG tablet Take 2.5 mg by mouth daily.    . cyclobenzaprine (FLEXERIL) 10 MG tablet Take 10 mg by mouth 3 (three) times daily as needed (muscle spasms).     . fluticasone (FLONASE) 50 MCG/ACT nasal spray Place into both nostrils daily.    . multivitamin (THERAGRAN) per tablet Take 1 tablet by mouth daily.      . naproxen sodium (ALEVE) 220 MG tablet Take 220 mg by mouth.    . pravastatin (PRAVACHOL) 10 MG tablet Take 10 mg by mouth daily.      . Pyridoxine HCl  (VITAMIN B-6 PO) Take 1 tablet by mouth daily.     . sertraline (ZOLOFT) 50 MG tablet Take 50 mg by mouth daily.     No current facility-administered medications for this encounter.     REVIEW OF SYSTEMS: On review of systems, the patient reports that she is doing well overall. She denies any chest pain, shortness of breath, cough, fevers, chills, night sweats, unintended weight changes. She denies any bowel or bladder disturbances, and denies abdominal pain, nausea or vomiting. She denies any new musculoskeletal or joint aches or pains. A complete review of systems is obtained and is otherwise negative.     PHYSICAL EXAM:  Wt Readings from Last 3 Encounters:  07/04/20 133 lb (60.3 kg)  07/01/20 135 lb 1.6 oz (61.3 kg)  03/30/13 150 lb (68 kg)   Unable to assess due to encounter type.    ECOG = 0  0 - Asymptomatic (Fully active, able to carry on all predisease activities without restriction)  1 - Symptomatic but completely ambulatory (Restricted in physically strenuous activity but ambulatory and able to carry out work of a light or sedentary nature. For example, light housework, office work)  2 - Symptomatic, <50% in bed during the day (Ambulatory and capable of all self care but unable to carry out any work activities. Up and about more than 50% of waking hours)  3 - Symptomatic, >50% in bed, but not bedbound (Capable of only limited self-care, confined to bed or chair 50% or more of waking hours)  4 - Bedbound (Completely disabled. Cannot carry on any self-care. Totally confined to bed or chair)  5 - Death   Eustace Pen MM, Creech RH, Tormey DC, et al. [phone]). "Toxicity and response criteria of the [facility]". [employer] Oncol. 5 (6): 649-55    LABORATORY DATA:  No results found for: WBC, HGB, HCT, MCV, PLT No results found for: NA, K, CL, CO2 No results found for: ALT, AST, GGT, ALKPHOS, BILITOT    RADIOGRAPHY: MM CLIP PLACEMENT LEFT  Result  Date: 06/11/2020 CLINICAL DATA:  Evaluate RIBBON clip placement following ultrasound-guided LEFT breast biopsy. EXAM: 3D DIAGNOSTIC LEFT MAMMOGRAM POST ULTRASOUND BIOPSY COMPARISON:  Previous exam(s). FINDINGS: 3D Mammographic images were obtained following ultrasound guided biopsy of the 0.6 cm mass within the UPPER INNER LEFT breast. The RIBBON biopsy marking clip is in expected position at the site of biopsy. IMPRESSION: Appropriate positioning of the RIBBON shaped biopsy marking clip at the site of biopsy in the UPPER INNER LEFT breast. Final Assessment: Post Procedure Mammograms for Marker Placement Electronically Signed   By: Margarette Canada M.D.   On: 06/11/2020 08:57   [place] LT BREAST BX W LOC DEV 1ST LESION IMG BX  SPEC US GUIDE  Addendum Date: 06/13/2020   ADDENDUM REPORT: 06/13/2020 14:37 ADDENDUM: PATHOLOGY revealed: A. BREAST, 10:00, LEFT, BIOPSY: - INVASIVE DUCTAL CARCINOMA. Comment: The biopsy material shows an infiltrative proliferation of cells arranged linearly and in small clusters. Based on the biopsy, the carcinoma appears Nottingham grade 1-2 of 3 and measures 0.9 cm in greatest linear extent. Pathology results are CONCORDANT with imaging findings, per Dr. Jeff Hu. Pathology results and recommendations below were discussed with patient by telephone on 06/13/2020. Patient reported biopsy site within normal limits with slight tenderness at the site. Post biopsy care instructions were reviewed, questions were answered and my direct phone number was provided to patient. Patient was instructed to call [facility] Mammography Department if any concerns or questions arise related to the biopsy. Recommendation: Surgical referral. Request for surgical referral was relayed to Mary Patterson RT at [facility] [facility] Mammography Department by Linda Nash RN on 06/13/2020. Addendum by Linda Nash RN on 06/13/2020. Electronically Signed   By: Jeffrey  Hu M.D.   On: 06/13/2020 14:37   Result  Date: 06/13/2020 CLINICAL DATA:  78-year-old female for tissue sampling of UPPER INNER LEFT breast mass. EXAM: ULTRASOUND GUIDED LEFT BREAST CORE NEEDLE BIOPSY COMPARISON:  Previous exam(s). PROCEDURE: I met with the patient and we discussed the procedure of ultrasound-guided biopsy, including benefits and alternatives. We discussed the high likelihood of a successful procedure. We discussed the risks of the procedure, including infection, bleeding, tissue injury, clip migration, and inadequate sampling. Informed written consent was given. The usual time-out protocol was performed immediately prior to the procedure. Lesion quadrant: UPPER INNER LEFT breast Using sterile technique and 1% Lidocaine as local anesthetic, under direct ultrasound visualization, a 14 gauge spring-loaded device was used to perform biopsy of the 0.6 cm mass at the 10 o'clock position of the LEFT breast 8 cm from the nipple using a MEDIAL approach. At the conclusion of the procedure a RIBBON tissue marker clip was deployed into the biopsy cavity. Follow up 2 view mammogram was performed and dictated separately. IMPRESSION: Ultrasound guided biopsy of 0.6 cm UPPER INNER LEFT breast mass. No apparent complications. Electronically Signed: By: Jeffrey  Hu M.D. On: 06/11/2020 08:53       IMPRESSION/PLAN: 1. Stage IA, cT1bN0M0, grade 1-2 invasive ductal carcinoma of the left breast. Dr. Moody discusses the pathology findings and reviews the nature of left breast disease. The consensus from surgery and medical oncology recommend breast conservation with lumpectomy with sentinel node biopsy. We reviewed the rationale for external radiotherapy to the breast in breast conserving surgery, followed by antiestrogen therapy. We also discussed the option of forgoing radiotherapy in certain populations of patients. She appears to possibly be able to forgo treatment, but is interested in remaining aggressive about her cancer care.   We discussed the  risks, benefits, short, and long term effects of radiotherapy, and the patient is interested in proceeding. Dr. Moody discusses the delivery and logistics of radiotherapy and anticipates a course of 4 or 6 1/2 weeks of radiotherapy with deep inspiration breath hold technique for the left breast. We will see her back a few weeks after surgery to discuss the simulation process and anticipate we starting radiotherapy about 4-6 weeks after surgery.   Given current concerns for patient exposure during the COVID-19 pandemic, this encounter was conducted via telephone.  The patient has provided two factor identification and has given verbal consent for this type of encounter and has been advised to   only accept a meeting of this type in a secure network environment. The time spent during this encounter was 45 minutes including preparation, discussion, and coordination of the patient's care. The attendants for this meeting include Blenda Nicely, RN, Dr. Lisbeth Renshaw, Hayden Pedro  and [name].  During the encounter,  Blenda Nicely, RN, Dr. Lisbeth Renshaw, and Hayden Pedro were located at [facility] Radiation Oncology Department.  Rosalia L Noe was located at home.    The above documentation reflects my direct findings during this shared patient visit. Please see the separate note by Dr. Lisbeth Renshaw on this date for the remainder of the patient's plan of care.    Carola Rhine, PAC

## 2020-07-04 NOTE — Progress Notes (Addendum)
[place] Psychosocial Distress Screening Clinical Social Work  Clinical Social Work was referred by distress screening protocol.  The patient scored a 7 on the Psychosocial Distress Thermometer which indicates moderate distress. Clinical Social Worker contacted patient by phone to assess for distress and other psychosocial needs.   [facility] Initial Psychosocial Assessment Clinical Social Work  Clinical Social Work contacted by phone to assess psychosocial, emotional, mental health, and spiritual needs of the patient.   Barriers to care/review of distress screen:  - Transportation:  Do you anticipate any problems getting to appointments?  Do you have someone who can help run errands for you if you need it?  Son and daughter live out of town - are in town currently to assist during time of surgery.  Patient hopes to drive to/from treatment.  She has no friends or family members who live nearby or are available during the day to assist w transportation.  - Help at home:  What is your living situation (alone, family, other)?  If you are physically unable to care for yourself, who would you call on to help you?  Lives alone, no near neighbors.  Children live out of state.  Thus far has been able to manage on her own.   - Support system:  What does your support system look like?  Who would you call on if you needed some kind of practical help?  What if you needed someone to talk to for emotional support?  Good support from children, but limited due to children not residing nearby.  Involved w animal rescue group.  Limited support in community. - Finances:  Are you concerned about finances.  Considering returning to work?  If not, applying for disability?  Limited income, sufficient for basic needs but no extra.    What is your understanding of where you are with your cancer? Its cause?  Your treatment plan and what happens next?  Newly diagnosed with breast cancer, about to have surgery this week.  Results of  biopsies will determine treatment plan, patient thinks she will definitely have radiation.    What are your worries for the future as you begin treatment for cancer?  Lives alone, cares for herself, concerned about her ability to drive during radiation treatment.  Has no other options.    What are your hopes and priorities during your treatment? What is important to you? What are your goals for your care?  Wants to remain active.  Has rescue dog that needs walking 2 miles/day.  Is used to being very active.  Likes to garden.  [place]    CSW Summary:  Patient and family psychosocial functioning including strengths, limitations, and coping skills:  79 year old female, newly diagnosed with breast cancer.  Lives alone, limited support during the day as friends have either moved away or work during the day.  Children live out of state.  Children here for period of surgery and recovery, will not be available long term.  Patient is used to being self sufficient and independent.  Does not have information on how cancer treatment will impact her ability to care for herself alone.  Does not have others to rely on long term.    Identifications of barriers to care:  Transportation is a possible barrier.  Limited finances  Availability of community resources:  Duanne [place], [employer] program, Publishing copy in [place], [place] Social Worker follow up needed: Yes.    Will mail  information to patient on above resources - she will have my card and is encouraged to reach out as needed.    ONCBCN DISTRESS SCREENING 07/04/2020  Screening Type Initial Screening  Distress experienced in past week (1-10) 7  Emotional problem type Nervousness/Anxiety;Adjusting to illness;Adjusting to appearance changes  Other Contact via phone    Clinical Social Worker follow up needed: No.  If yes, follow up plan:  Beverely Pace, [place] [facility] [place], [place] Social  Worker Phone:  [phone] Cell:  [phone]

## 2020-07-04 NOTE — H&P (Signed)
Doris Ellis  Location: [facility] Patient #: [id number] DOB: 21-Jul-1941 Divorced / Language: [name] / Race: White Female  History of Present Illness   The patient is a 79 year old female who presents with a complaint of breast cancer.  The PCP is Dr. Asencion Noble  She comes with her granddaughter, Denyse Amass.  She had not had a mammogram in a couple years. She had a prior left breast lumpectomy in her 68s for benign disease. She took Premarin up until 2015, but has stopped this. She has no family history of breast cancer. Her last mammogram was about 2 years ago.  Mammograms: 05/28/2020 - Irregular mass at 10 o'clock in the left breast (no specific size mentioned) Biopsy: Left breast on 06/11/2020 at 10 o'clock ([id number])- IDC, ER - 90%, PR - negative , Ki67 - 5% , Her2Neu - negative Family history of breast or ovarian cancer: No On hormone therapy:No  I discussed the options for breast cancer treatment with the patient. I described the multidisciplinary approach to breast cancer, which includes medical oncology and radiation oncology. I discussed the surgical options of lumpectomy vs. mastectomy. If mastectomy, there is the possibility of reconstruction. I discussed the options of lymph node biopsy. The treatment plan depends on the pathologic staging of the tumor and the patient's personal wishes. The risks of surgery include, but are not limited to, bleeding, infection, the need for further surgery, and nerve injury. The patient has been given literature on the treatment of breast cancer.  Plan: 1. Medical and Radiation Oncology, 2. PT consultation, 3. left breast lumpectomy (seed localization) and left axillary sentinel lymph node biopsy  Past Medical History: 1. Colonoscopy - 2014 - [facility] 2. History of anxiety/depression 3. HTN 4. History of head injury in 1985 On Zoloft 5. History of back  surgery - 1996 for disk disease 6. History of hysterectomy/bladder tack  Social History: Divored x 3 She has 2 children. Her granddaughter, Denyse Amass, is here with her today. 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 Breast Mass; Local Excision  Left. Cataract Surgery  Bilateral. Colon Polyp Removal - Colonoscopy  Hysterectomy (due to cancer) - Complete  Hysterectomy (not due to cancer) - Complete  Spinal Surgery - Lower Back  Tonsillectomy   Diagnostic Studies History (April Staton, CMA; 06/20/2020 2:22 PM) Colonoscopy  5-10 years ago Mammogram  within last year Pap Smear  >5 years ago  Allergies (April Staton, [place]; 06/20/2020 2:24 PM) No Known Drug Allergies  [06/20/2020]:  Medication History (April Staton, CMA; 06/20/2020 2:26 PM) ALPRAZolam (0.5MG Tablet, Oral) Active. amLODIPine Besylate (2.5MG Tablet, Oral) Active. Pravastatin Sodium (10MG Tablet, Oral) Active. Fluticasone Propionate (50MCG/ACT Suspension, Nasal) Active. Cyclobenzaprine HCl (10MG Tablet, Oral) Active. Medications Reconciled  Social History (April Staton, CMA; 06/20/2020 2:22 PM) Alcohol use  Occasional alcohol use. Caffeine use  Coffee, Tea. Illicit drug use  Prefer to discuss with provider.  Family History (April Staton, [place]; 06/20/2020 2:22 PM) Alcohol Abuse  Father. Arthritis  Mother. Prostate Cancer  Brother.  Pregnancy / Birth History (April Staton, [place]; 06/20/2020 2:22 PM) Age at menarche  13 years. Age of menopause  32-50 Gravida  2 Para  2  Other Problems (April Staton, CMA; 06/20/2020 2:22 PM) Anxiety Disorder  Arthritis  Back Pain  Cancer  Depression  Hemorrhoids  High blood pressure  Lump In Breast     Review of  Systems (April Staton CMA; 06/20/2020 2:22 PM) General Not Present- Appetite Loss, Chills, Fatigue, Fever, Night Sweats, Weight Gain  and Weight Loss. Skin Present- Dryness. Not Present- Change in Wart/Mole, Hives, Jaundice, New Lesions, Non-Healing Wounds, Rash and Ulcer. HEENT Present- Ringing in the Ears, Seasonal Allergies and Wears glasses/contact lenses. Not Present- Earache, Hearing Loss, Hoarseness, Nose Bleed, Oral Ulcers, Sinus Pain, Sore Throat, Visual Disturbances and Yellow Eyes. Respiratory Not Present- Bloody sputum, Chronic Cough, Difficulty Breathing, Snoring and Wheezing. Breast Present- Breast Mass. Not Present- Breast Pain, Nipple Discharge and Skin Changes. Cardiovascular Not Present- Chest Pain, Difficulty Breathing Lying Down, Leg Cramps, Palpitations, Rapid Heart Rate, Shortness of Breath and Swelling of Extremities. Gastrointestinal Not Present- Abdominal Pain, Bloating, Bloody Stool, Change in Bowel Habits, Chronic diarrhea, Constipation, Difficulty Swallowing, Excessive gas, Gets full quickly at meals, Hemorrhoids, Indigestion, Nausea, Rectal Pain and Vomiting. Female Genitourinary Not Present- Frequency, Nocturia, Painful Urination, Pelvic Pain and Urgency. Musculoskeletal Present- Back Pain. Not Present- Joint Pain, Joint Stiffness, Muscle Pain, Muscle Weakness and Swelling of Extremities. Neurological Not Present- Decreased Memory, Fainting, Headaches, Numbness, Seizures, Tingling, Tremor, Trouble walking and Weakness. Psychiatric Present- Anxiety and Depression. Not Present- Bipolar, Change in Sleep Pattern, Fearful and Frequent crying. Endocrine Present- Hot flashes. Not Present- Cold Intolerance, Excessive Hunger, Hair Changes, Heat Intolerance and New Diabetes. Hematology Not Present- Blood Thinners, Easy Bruising, Excessive bleeding, Gland problems, HIV and Persistent Infections.  Vitals (April Staton CMA; 06/20/2020 2:27 PM) 06/20/2020 2:26 PM Weight: 135 lb Height: 64in Body Surface Area: 1.66 m Body Mass Index: 23.17 kg/m  Temp.: 98.56F (Temporal)  Pulse: 67 (Regular)  P.OX: 98%  (Room air) BP: 130/70(Sitting, Left Arm, Standard)  Physical Exam  General: WN older WF who isalert and generally healthy appearing. HEENT: Normal. Pupils equal.  Neck: Supple. No mass. No thyroid mass.  Lymph Nodes: No supraclavicular or cervical nodes.  Lungs: Clear to auscultation and symmetric breath sounds. Heart: RRR. No murmur or rub.  Breasts: Right - no mass or nodule Left - bruise at 10 o'clock in UIQ of the left breast. I can feel a fullness at the bruise.  Abdomen: Soft. No mass. No tenderness. No hernia. Normal bowel sounds.  Pfannenstiel incision  Extremities: Good strength and ROM in upper and lower extremities.  Neurologic: Grossly intact to motor and sensory function. Psychiatric: Has normal mood and affect. Behavior is normal.   Assessment & Plan 1.  MALIGNANT NEOPLASM OF LEFT BREAST, STAGE 1, ESTROGEN RECEPTOR POSITIVE (C50.912)  Story: Left breast biopsy on 06/11/2020 at 10 o'clock ([id number]) - IDC, ER - 90%, PR - negative , Ki67 - 5% , Her2Neu - negative  Plan:  1. Medical and Radiation Oncology   She saw Dr. Burr Medico   PLAN:   -will order oncotype if tumor > 1 cm on final path   -f/u after adjuvant RT unless oncotype shows high risk   She saw Dr. Lisbeth Renshaw for rad tx  2. PT consultation  3. left breast lumpectomy (seed localization) and left axillary sentinel lymph node biopsy  2. History of anxiety/depression 3. HTN 4. History of head injury in [place], MD, [facility] Office phone:  [phone]

## 2020-07-05 ENCOUNTER — Ambulatory Visit (HOSPITAL_BASED_OUTPATIENT_CLINIC_OR_DEPARTMENT_OTHER): Payer: Medicare Other | Admitting: Certified Registered Nurse Anesthetist

## 2020-07-05 ENCOUNTER — Ambulatory Visit (HOSPITAL_BASED_OUTPATIENT_CLINIC_OR_DEPARTMENT_OTHER)
Admission: RE | Admit: 2020-07-05 | Discharge: 2020-07-05 | Disposition: A | Discharge status: Home / Self Care | Payer: Medicare Other | Attending: Surgery | Admitting: Surgery

## 2020-07-05 ENCOUNTER — Encounter (HOSPITAL_BASED_OUTPATIENT_CLINIC_OR_DEPARTMENT_OTHER): Payer: Self-pay | Admitting: Surgery

## 2020-07-05 ENCOUNTER — Other Ambulatory Visit: Payer: Self-pay

## 2020-07-05 ENCOUNTER — Encounter (HOSPITAL_BASED_OUTPATIENT_CLINIC_OR_DEPARTMENT_OTHER)
Admission: RE | Disposition: A | Discharge status: Home / Self Care | Payer: Self-pay | Source: Home / Self Care | Attending: Surgery

## 2020-07-05 ENCOUNTER — Ambulatory Visit
Admission: RE | Admit: 2020-07-05 | Discharge: 2020-07-05 | Disposition: A | Discharge status: Home / Self Care | Payer: Medicare Other | Source: Ambulatory Visit | Attending: Surgery | Admitting: Surgery

## 2020-07-05 ENCOUNTER — Ambulatory Visit (HOSPITAL_COMMUNITY)
Admission: RE | Admit: 2020-07-05 | Discharge: 2020-07-05 | Disposition: A | Discharge status: Home / Self Care | Payer: Medicare Other | Source: Ambulatory Visit | Attending: Surgery | Admitting: Surgery

## 2020-07-05 DIAGNOSIS — Z79899 Other long term (current) drug therapy: Secondary | ICD-10-CM | POA: Insufficient documentation

## 2020-07-05 DIAGNOSIS — C50912 Malignant neoplasm of unspecified site of left female breast: Secondary | ICD-10-CM | POA: Diagnosis not present

## 2020-07-05 DIAGNOSIS — F419 Anxiety disorder, unspecified: Secondary | ICD-10-CM | POA: Insufficient documentation

## 2020-07-05 DIAGNOSIS — G8918 Other acute postprocedural pain: Secondary | ICD-10-CM | POA: Diagnosis not present

## 2020-07-05 DIAGNOSIS — C50212 Malignant neoplasm of upper-inner quadrant of left female breast: Secondary | ICD-10-CM | POA: Insufficient documentation

## 2020-07-05 DIAGNOSIS — Z170 Estrogen receptor positive status [ER+]: Secondary | ICD-10-CM

## 2020-07-05 DIAGNOSIS — I10 Essential (primary) hypertension: Secondary | ICD-10-CM | POA: Diagnosis not present

## 2020-07-05 DIAGNOSIS — F329 Major depressive disorder, single episode, unspecified: Secondary | ICD-10-CM | POA: Insufficient documentation

## 2020-07-05 DIAGNOSIS — R928 Other abnormal and inconclusive findings on diagnostic imaging of breast: Secondary | ICD-10-CM | POA: Diagnosis not present

## 2020-07-05 DIAGNOSIS — E785 Hyperlipidemia, unspecified: Secondary | ICD-10-CM | POA: Diagnosis not present

## 2020-07-05 HISTORY — PX: BREAST LUMPECTOMY: SHX2

## 2020-07-05 HISTORY — PX: BREAST LUMPECTOMY WITH RADIOACTIVE SEED AND SENTINEL LYMPH NODE BIOPSY: SHX6550

## 2020-07-05 HISTORY — DX: Essential (primary) hypertension: I10

## 2020-07-05 SURGERY — BREAST LUMPECTOMY WITH RADIOACTIVE SEED AND SENTINEL LYMPH NODE BIOPSY
Anesthesia: General | Site: Breast | Laterality: Left

## 2020-07-05 MED ORDER — GLYCOPYRROLATE PF 0.2 MG/ML IJ SOSY
PREFILLED_SYRINGE | INTRAMUSCULAR | Status: DC | PRN
  Administered 2020-07-05: .2 mg via INTRAVENOUS

## 2020-07-05 MED ORDER — BUPIVACAINE HCL (PF) 0.25 % IJ SOLN
INTRAMUSCULAR | Status: DC | PRN
  Administered 2020-07-05: 10 mL

## 2020-07-05 MED ORDER — CEFAZOLIN SODIUM-DEXTROSE 2-4 GM/100ML-% IV SOLN
2.0000 g | INTRAVENOUS | Status: AC
  Administered 2020-07-05: 2 g via INTRAVENOUS

## 2020-07-05 MED ORDER — TECHNETIUM TC 99M SULFUR COLLOID FILTERED
1.0000 | Freq: Once | INTRAVENOUS | Status: AC | PRN
  Administered 2020-07-05: 1 via INTRADERMAL

## 2020-07-05 MED ORDER — ACETAMINOPHEN 500 MG PO TABS
ORAL_TABLET | ORAL | Status: AC
  Filled 2020-07-05: qty 2

## 2020-07-05 MED ORDER — MIDAZOLAM HCL 2 MG/2ML IJ SOLN
INTRAMUSCULAR | Status: AC
  Filled 2020-07-05: qty 2

## 2020-07-05 MED ORDER — LIDOCAINE 2% (20 MG/ML) 5 ML SYRINGE
INTRAMUSCULAR | Status: AC
  Filled 2020-07-05: qty 5

## 2020-07-05 MED ORDER — DEXAMETHASONE SODIUM PHOSPHATE 10 MG/ML IJ SOLN
INTRAMUSCULAR | Status: DC | PRN
  Administered 2020-07-05: 10 mg via INTRAVENOUS

## 2020-07-05 MED ORDER — CHLORHEXIDINE GLUCONATE 4 % EX LIQD: 60.0000 mL | Freq: Once | CUTANEOUS | Status: DC

## 2020-07-05 MED ORDER — LIDOCAINE 2% (20 MG/ML) 5 ML SYRINGE
INTRAMUSCULAR | Status: DC | PRN
  Administered 2020-07-05: 40 mg via INTRAVENOUS

## 2020-07-05 MED ORDER — FENTANYL CITRATE (PF) 100 MCG/2ML IJ SOLN
25.0000 ug | INTRAMUSCULAR | Status: DC | PRN
  Administered 2020-07-05 (×2): 25 ug via INTRAVENOUS

## 2020-07-05 MED ORDER — MIDAZOLAM HCL 2 MG/2ML IJ SOLN
1.0000 mg | Freq: Once | INTRAMUSCULAR | Status: AC
  Administered 2020-07-05: 1 mg via INTRAVENOUS

## 2020-07-05 MED ORDER — ACETAMINOPHEN 500 MG PO TABS
1000.0000 mg | ORAL_TABLET | ORAL | Status: AC
  Administered 2020-07-05: 1000 mg via ORAL

## 2020-07-05 MED ORDER — PROPOFOL 10 MG/ML IV BOLUS
INTRAVENOUS | Status: AC
  Filled 2020-07-05: qty 20

## 2020-07-05 MED ORDER — SODIUM CHLORIDE (PF) 0.9 % IJ SOLN
INTRAVENOUS | Status: DC | PRN
  Administered 2020-07-05: 1 mL via INTRADERMAL

## 2020-07-05 MED ORDER — FENTANYL CITRATE (PF) 100 MCG/2ML IJ SOLN
INTRAMUSCULAR | Status: AC
  Filled 2020-07-05: qty 2

## 2020-07-05 MED ORDER — DEXAMETHASONE SODIUM PHOSPHATE 10 MG/ML IJ SOLN
INTRAMUSCULAR | Status: AC
  Filled 2020-07-05: qty 1

## 2020-07-05 MED ORDER — ONDANSETRON HCL 4 MG/2ML IJ SOLN
INTRAMUSCULAR | Status: DC | PRN
  Administered 2020-07-05: 4 mg via INTRAVENOUS

## 2020-07-05 MED ORDER — ACETAMINOPHEN 325 MG PO TABS
650.0000 mg | ORAL_TABLET | Freq: Once | ORAL | Status: AC
  Administered 2020-07-05: 650 mg via ORAL

## 2020-07-05 MED ORDER — FENTANYL CITRATE (PF) 100 MCG/2ML IJ SOLN
50.0000 ug | Freq: Once | INTRAMUSCULAR | Status: AC
  Administered 2020-07-05: 50 ug via INTRAVENOUS

## 2020-07-05 MED ORDER — CEFAZOLIN SODIUM-DEXTROSE 2-4 GM/100ML-% IV SOLN
INTRAVENOUS | Status: AC
  Filled 2020-07-05: qty 100

## 2020-07-05 MED ORDER — EPHEDRINE SULFATE-NACL 50-0.9 MG/10ML-% IV SOSY
PREFILLED_SYRINGE | INTRAVENOUS | Status: DC | PRN
  Administered 2020-07-05 (×5): 10 mg via INTRAVENOUS
  Administered 2020-07-05: 15 mg via INTRAVENOUS

## 2020-07-05 MED ORDER — ACETAMINOPHEN 325 MG PO TABS
ORAL_TABLET | ORAL | Status: AC
  Filled 2020-07-05: qty 2

## 2020-07-05 MED ORDER — FENTANYL CITRATE (PF) 100 MCG/2ML IJ SOLN
INTRAMUSCULAR | Status: DC | PRN
Start: 2020-07-05 — End: 2020-07-05
  Administered 2020-07-05 (×4): 25 ug via INTRAVENOUS

## 2020-07-05 MED ORDER — PHENYLEPHRINE 40 MCG/ML (10ML) SYRINGE FOR IV PUSH (FOR BLOOD PRESSURE SUPPORT)
PREFILLED_SYRINGE | INTRAVENOUS | Status: DC | PRN
  Administered 2020-07-05 (×2): 80 ug via INTRAVENOUS

## 2020-07-05 MED ORDER — PROPOFOL 10 MG/ML IV BOLUS
INTRAVENOUS | Status: DC | PRN
  Administered 2020-07-05: 100 mg via INTRAVENOUS

## 2020-07-05 MED ORDER — PHENYLEPHRINE 40 MCG/ML (10ML) SYRINGE FOR IV PUSH (FOR BLOOD PRESSURE SUPPORT)
PREFILLED_SYRINGE | INTRAVENOUS | Status: AC
  Filled 2020-07-05: qty 10

## 2020-07-05 MED ORDER — TRAMADOL HCL 50 MG PO TABS: 50.0000 mg | ORAL_TABLET | Freq: Four times a day (QID) | ORAL | 0 refills | Status: DC | PRN

## 2020-07-05 MED ORDER — ONDANSETRON HCL 4 MG/2ML IJ SOLN
INTRAMUSCULAR | Status: AC
  Filled 2020-07-05: qty 2

## 2020-07-05 MED ORDER — LACTATED RINGERS IV SOLN: INTRAVENOUS | Status: DC

## 2020-07-05 SURGICAL SUPPLY — 56 items
ADH SKN CLS APL DERMABOND .7 (GAUZE/BANDAGES/DRESSINGS) ×1
APL PRP STRL LF DISP 70% ISPRP (MISCELLANEOUS) ×1
APL SKNCLS STERI-STRIP NONHPOA (GAUZE/BANDAGES/DRESSINGS)
BENZOIN TINCTURE PRP APPL 2/3 (GAUZE/BANDAGES/DRESSINGS) IMPLANT
BINDER BREAST LRG (GAUZE/BANDAGES/DRESSINGS) ×2 IMPLANT
BINDER BREAST MEDIUM (GAUZE/BANDAGES/DRESSINGS) IMPLANT
BINDER BREAST XLRG (GAUZE/BANDAGES/DRESSINGS) IMPLANT
BINDER BREAST XXLRG (GAUZE/BANDAGES/DRESSINGS) IMPLANT
BLADE SURG 15 STRL LF DISP TIS (BLADE) ×1 IMPLANT
BLADE SURG 15 STRL SS (BLADE) ×3
CANISTER SUC SOCK COL 7IN (MISCELLANEOUS) IMPLANT
CANISTER SUCT 1200ML W/VALVE (MISCELLANEOUS) ×3 IMPLANT
CHLORAPREP W/TINT 26 (MISCELLANEOUS) ×3 IMPLANT
CLIP VESOCCLUDE SM WIDE 6/CT (CLIP) ×3 IMPLANT
CLOSURE WOUND 1/2 X4 (GAUZE/BANDAGES/DRESSINGS)
COVER BACK TABLE 60X90IN (DRAPES) ×3 IMPLANT
COVER MAYO STAND STRL (DRAPES) ×3 IMPLANT
COVER PROBE W GEL 5X96 (DRAPES) ×3 IMPLANT
COVER WAND RF STERILE (DRAPES) IMPLANT
DECANTER SPIKE VIAL GLASS SM (MISCELLANEOUS) IMPLANT
DERMABOND ADVANCED (GAUZE/BANDAGES/DRESSINGS) ×2
DERMABOND ADVANCED .7 DNX12 (GAUZE/BANDAGES/DRESSINGS) ×1 IMPLANT
DRAPE LAPAROSCOPIC ABDOMINAL (DRAPES) ×3 IMPLANT
DRAPE UTILITY XL STRL (DRAPES) ×3 IMPLANT
DRSG PAD ABDOMINAL 8X10 ST (GAUZE/BANDAGES/DRESSINGS) ×2 IMPLANT
ELECT COATED BLADE 2.86 ST (ELECTRODE) ×3 IMPLANT
ELECT REM PT RETURN 9FT ADLT (ELECTROSURGICAL) ×3
ELECTRODE REM PT RTRN 9FT ADLT (ELECTROSURGICAL) ×1 IMPLANT
GAUZE SPONGE 4X4 12PLY STRL (GAUZE/BANDAGES/DRESSINGS) ×3 IMPLANT
GLOVE SURG SYN 7.5  E (GLOVE) ×6
GLOVE SURG SYN 7.5 E (GLOVE) ×2 IMPLANT
GLOVE SURG SYN 7.5 PF PI (GLOVE) ×2 IMPLANT
GOWN STRL REUS W/ TWL LRG LVL3 (GOWN DISPOSABLE) ×1 IMPLANT
GOWN STRL REUS W/ TWL XL LVL3 (GOWN DISPOSABLE) ×1 IMPLANT
GOWN STRL REUS W/TWL LRG LVL3 (GOWN DISPOSABLE) ×3
GOWN STRL REUS W/TWL XL LVL3 (GOWN DISPOSABLE) ×3
KIT MARKER MARGIN INK (KITS) ×3 IMPLANT
NDL HYPO 25X1 1.5 SAFETY (NEEDLE) ×1 IMPLANT
NDL SAFETY ECLIPSE 18X1.5 (NEEDLE) IMPLANT
NEEDLE HYPO 18GX1.5 SHARP (NEEDLE)
NEEDLE HYPO 25X1 1.5 SAFETY (NEEDLE) ×3 IMPLANT
NS IRRIG 1000ML POUR BTL (IV SOLUTION) ×3 IMPLANT
PACK BASIN DAY SURGERY FS (CUSTOM PROCEDURE TRAY) ×3 IMPLANT
PENCIL SMOKE EVACUATOR (MISCELLANEOUS) ×3 IMPLANT
SHEET MEDIUM DRAPE 40X70 STRL (DRAPES) ×3 IMPLANT
SLEEVE SCD COMPRESS KNEE MED (MISCELLANEOUS) ×3 IMPLANT
SPONGE LAP 18X18 RF (DISPOSABLE) ×3 IMPLANT
STRIP CLOSURE SKIN 1/2X4 (GAUZE/BANDAGES/DRESSINGS) IMPLANT
SUT MNCRL AB 4-0 PS2 18 (SUTURE) ×3 IMPLANT
SUT VICRYL 3-0 CR8 SH (SUTURE) ×5 IMPLANT
SYR CONTROL 10ML LL (SYRINGE) ×3 IMPLANT
TOWEL GREEN STERILE FF (TOWEL DISPOSABLE) ×3 IMPLANT
TRAY FAXITRON CT DISP (TRAY / TRAY PROCEDURE) ×3 IMPLANT
TUBE CONNECTING 20'X1/4 (TUBING) ×1
TUBE CONNECTING 20X1/4 (TUBING) ×2 IMPLANT
YANKAUER SUCT BULB TIP NO VENT (SUCTIONS) ×3 IMPLANT

## 2020-07-05 NOTE — Anesthesia Procedure Notes (Signed)
Procedure Name: LMA Insertion Date/Time: 07/05/2020 11:37 AM Performed by: Genelle Bal, CRNA Pre-anesthesia Checklist: Patient identified, Emergency Drugs available, Suction available and Patient being monitored Patient Re-evaluated:Patient Re-evaluated prior to induction Oxygen Delivery Method: Circle system utilized Preoxygenation: Pre-oxygenation with 100% oxygen Induction Type: IV induction Ventilation: Mask ventilation without difficulty LMA: LMA inserted LMA Size: 4.0 Number of attempts: 1 Airway Equipment and Method: Bite block Placement Confirmation: positive ETCO2 Tube secured with: Tape Dental Injury: Teeth and Oropharynx as per pre-operative assessment

## 2020-07-05 NOTE — Anesthesia Procedure Notes (Signed)
Anesthesia Regional Block: Pectoralis block   Pre-Anesthetic Checklist: ,, timeout performed, Correct Patient, Correct Site, Correct Laterality, Correct Procedure, Correct Position, site marked, Risks and benefits discussed,  Surgical consent,  Pre-op evaluation,  At surgeon's request and post-op pain management  Laterality: Left  Prep: chloraprep       Needles:   Needle Type: Echogenic Stimulator Needle          Additional Needles:   Procedures: Doppler guided,,,, ultrasound used (permanent image in chart),,,,  Narrative:  Start time: 07/05/2020 10:30 AM End time: 07/05/2020 10:50 AM Injection made incrementally with aspirations every 5 mL.  Performed by: Personally  Anesthesiologist: Belinda Block, MD

## 2020-07-05 NOTE — Op Note (Signed)
07/05/2020  12:59 PM  PATIENT:  Doris Ellis DOB: 02/01/1941 MRN: [id number]  PREOP DIAGNOSIS:   LEFT BREAST CANCER  POSTOP DIAGNOSIS:    Left breast cancer, 10 o'clock position (T1, N0)  PROCEDURE:   Procedure(s):  LEFT BREAST LUMPECTOMY WITH RADIOACTIVE SEED AND LEFT AXILLARY SENTINEL LYMPH NODE BIOPSY, Injection of peri areolar area of breast with methylene blue (0.5 cc), deep sentinel lymph node biopsy  SURGEON:   Alphonsa Overall, M.D.  ANESTHESIA:   General  Anesthesiologist: Belinda Block, MD CRNA: Blocker, Ernesta Amble, CRNA; Genelle Bal, CRNA  General  EBL:  <50  ml  DRAINS:  none   LOCAL MEDICATIONS USED:   10 cc 1/4% marcaine, left pectoral block  SPECIMEN:   Left breast lumpectomy (6 color paint), left axillary sentinel lymph node (counts 25, background 5, not blue)  COUNTS CORRECT:  YES  INDICATIONS FOR PROCEDURE:  Doris Ellis is a 79 y.o. (DOB: 1940/12/07) white female whose primary care physician is Asencion Noble, MD and comes for left breast lumpectomy and left axillary sentinel lymph node biopsy.   The options for breast cancer treatment have been discussed with the patient. She elected to proceed with lumpectomy and axillary sentinel lymph node.     The indications and potential complications of surgery were explained to the patient. Potential complications include, but are not limited to, bleeding, infection, the need for further surgery, and nerve injury.     She had a I131 seed placed on 07/04/2020 in her left breast at the [facility].  The seed is in the 10 o'clock position of the left breast.   In the holding area, her left areola was injected with 1 millicurie of Technitium Sulfur Colloid.  OPERATIVE NOTE:   The patient was taken to operating room # 8 at [facility] where she underwent a general anesthesia  supervised by Anesthesiologist: Belinda Block, MD CRNA: Blocker, Ernesta Amble, CRNA; Genelle Bal, CRNA. Her left breast and axilla  were prepped with  ChloraPrep and sterilely draped.    A time-out was held and the surgical check list was reviewed.    I injected about 0.5 mL of 40% methylene blue around her left areola.   The cancer was about at the 10 o'clock position of the left breast.   It was 6 cm from the areola.  I made an incision directly over the cancer.  I used the Neoprobe to identify the I131 seed.  I tried to excise an area around the tumor of at least 1 cm.    I excised this block of breast tissue approximately 3 cm by 4 cm  in diameter.   I painted the lumpectomy specimen with the 6 color paint kit and did a specimen mammogram which confirmed the mass, clip, and the seed were all in the right position in the specimen.  The closest margin on the specimen mammogram was medial. The specimen was sent to pathology who called back to confirm that they have the seed and the specimen.   I then started the left deep axillary sentinel lymph node biopsy. I made an incision in the left axilla.  I found a hot area at the junction of the breast and the pectoralis major muscle, deep in the axilla. I cut down and  identified a hot node that had counts of 20 and the background has 5 counts. Of note, this did not localize well. The lymph node was not blue. I checked her  internal mammary nodes and supraclavicular nodes with the neoprobe and found no other hot area. The axillary node was then sent to pathology.    I then irrigated the wound with saline. I infiltrated approximately 10 mL of 1/4% Marcaine between the incisions.  She had a left pectoral block by anesthesia pre op.  I placed 4 clips to mark biopsy cavity, at 12, 3, 6, and 9 o'clock.  I then closed all the wounds in layers using 3-0 Vicryl sutures for the deep layer. At the skin, I closed the incisions with a 4-0 Monocryl suture. The incisions were then painted with Dermabond.  She had gauze placed over the wounds and her breast placed in a breast binder.   The patient  tolerated the procedure well, was transported to the recovery room in good condition. Sponge and needle count were correct at the end of the case.   Final pathology is pending.   Alphonsa Overall, MD, [facility] Pager: [phone] Office phone:  [phone]

## 2020-07-05 NOTE — Progress Notes (Signed)
Emotional support during breast injections °

## 2020-07-05 NOTE — Progress Notes (Signed)
Assisted Dr. Green with left, ultrasound guided, pectoralis block. Side rails up, monitors on throughout procedure. See vital signs in flow sheet. Tolerated Procedure well. ?

## 2020-07-05 NOTE — Discharge Instructions (Signed)
[facility] - DISCHARGE INSTRUCTIONS TO PATIENT  Activity:  Driving - May drive in 2 to 4 days, if you are doing well and off pain meds   Lifting - No lifting more than 15 pounds for 1 week, then no limit.                       Practice your Covid-19 protection:  Wear a mask, social distance, and wash your hands frequently  Wound Care:   Leave the incision dry for 2 days, then you may shower.  You may wear a bra after that or use the breast binder.  Diet:  As tolerated  Follow up appointment:  Call Dr. Pollie Friar office [facility]) at [phone] for an appointment in 2 to 3 weeks.  Medications and dosages:  Resume your home medications.  You have a prescription for:  Ultram             You may also take Tylenol, ibuprofen, or Aleve for pain  Call Dr. Lucia Gaskins or his office  [phone]) if you have:  Temperature greater than 100.4,  Persistent nausea and vomiting,  Severe uncontrolled pain,  Redness, tenderness, or signs of infection (pain, swelling, redness, odor or green/yellow discharge around the site),  Difficulty breathing, headache or visual disturbances,  Any other questions or concerns you may have after discharge.  In an emergency, call 911 or go to an Emergency Department at a nearby hospital.    Post Anesthesia Home Care Instructions  Activity: Get plenty of rest for the remainder of the day. A responsible individual must stay with you for 24 hours following the procedure.  For the next 24 hours, DO NOT: -Drive a car -Paediatric nurse -Drink alcoholic beverages -Take any medication unless instructed by your physician -Make any legal decisions or sign important papers.  Meals: Start with liquid foods such as gelatin or soup. Progress to regular foods as tolerated. Avoid greasy, spicy, heavy foods. If nausea and/or vomiting occur, drink only clear liquids until the nausea and/or vomiting subsides. Call your physician if vomiting  continues.  Special Instructions/Symptoms: Your throat may feel dry or sore from the anesthesia or the breathing tube placed in your throat during surgery. If this causes discomfort, gargle with warm salt water. The discomfort should disappear within 24 hours.  If you had a scopolamine patch placed behind your ear for the management of post- operative nausea and/or vomiting:  1. The medication in the patch is effective for 72 hours, after which it should be removed.  Wrap patch in a tissue and discard in the trash. Wash hands thoroughly with soap and water. 2. You may remove the patch earlier than 72 hours if you experience unpleasant side effects which may include dry mouth, dizziness or visual disturbances. 3. Avoid touching the patch. Wash your hands with soap and water after contact with the patch.    Tylenol may be given after 3pm, if needed.

## 2020-07-05 NOTE — Anesthesia Postprocedure Evaluation (Signed)
Anesthesia Post Note  Patient: Doris Ellis  Procedure(s) Performed: LEFT BREAST LUMPECTOMY WITH RADIOACTIVE SEED AND LEFT AXILLARY SENTINEL LYMPH NODE BIOPSY (Left Breast)     Patient location during evaluation: PACU Anesthesia Type: General Level of consciousness: awake Pain management: pain level controlled Vital Signs Assessment: post-procedure vital signs reviewed and stable Respiratory status: spontaneous breathing Cardiovascular status: stable Postop Assessment: no apparent nausea or vomiting Anesthetic complications: no   No complications documented.  Last Vitals:  Vitals:   07/05/20 1530 07/05/20 1600  BP: 134/64 132/70  Pulse: 77 79  Resp: 18 18  Temp:    SpO2: 100% 100%    Last Pain:  Vitals:   07/05/20 1646  TempSrc:   PainSc: 0-No pain                 Tameshia Bonneville

## 2020-07-05 NOTE — Transfer of Care (Signed)
Immediate Anesthesia Transfer of Care Note  Patient: Doris Ellis  Procedure(s) Performed: LEFT BREAST LUMPECTOMY WITH RADIOACTIVE SEED AND LEFT AXILLARY SENTINEL LYMPH NODE BIOPSY (Left Breast)  Patient Location: PACU  Anesthesia Type:GA combined with regional for post-op pain  Level of Consciousness: awake, alert  and oriented  Airway & Oxygen Therapy: Patient Spontanous Breathing and Patient connected to face mask oxygen  Post-op Assessment: Report given to RN and Post -op Vital signs reviewed and stable  Post vital signs: Reviewed and stable  Last Vitals:  Vitals Value Taken Time  BP 127/61   Temp    Pulse 89 07/05/20 1307  Resp 18 07/05/20 1307  SpO2 100 % 07/05/20 1307  Vitals shown include unvalidated device data.  Last Pain:  Vitals:   07/05/20 0902  TempSrc: Oral  PainSc: 0-No pain      Patients Stated Pain Goal: 1 (48/54/62 7035)  Complications: No complications documented.

## 2020-07-05 NOTE — Progress Notes (Signed)
Was the fall witnessed: witnessed by pt's daughter  Patient condition before and after the fall: no loss of consciousness, no visible injury, pt remains alert and oriented  Patient's reaction to the fall: pt in good spirits, no pain reported  Name of the doctor that was notified including date and time: Dr Lucia Gaskins  Any interventions and vital signs: see flowsheets

## 2020-07-05 NOTE — Interval H&P Note (Signed)
History and Physical Interval Note:  07/05/2020 11:22 AM  Doris Ellis  has presented today for surgery, with the diagnosis of LEFT BREAST CANCER.  The various methods of treatment have been discussed with the patient and family.  Seed in place.  Lillia Corporal, daughter, here with patient.  After consideration of risks, benefits and other options for treatment, the patient has consented to  Procedure(s) with comments: LEFT BREAST LUMPECTOMY WITH RADIOACTIVE SEED AND LEFT AXILLARY SENTINEL LYMPH NODE BIOPSY (Left) - LEFT BREAST CANCER as a surgical intervention.  The patient's history has been reviewed, patient examined, no change in status, stable for surgery.  I have reviewed the patient's chart and labs.  Questions were answered to the patient's satisfaction.     Shann Medal

## 2020-07-05 NOTE — Anesthesia Preprocedure Evaluation (Signed)
Anesthesia Evaluation  Patient identified by MRN, date of birth, ID band Patient awake    Reviewed: Allergy & Precautions, NPO status , Patient's Chart, lab work & pertinent test results  Airway Mallampati: II  TM Distance: >3 FB     Dental   Pulmonary neg pulmonary ROS,    breath sounds clear to auscultation       Cardiovascular hypertension,  Rhythm:Regular Rate:Normal     Neuro/Psych    GI/Hepatic negative GI ROS, Neg liver ROS,   Endo/Other  negative endocrine ROS  Renal/GU negative Renal ROS     Musculoskeletal  (+) Arthritis ,   Abdominal   Peds  Hematology   Anesthesia Other Findings   Reproductive/Obstetrics                             Anesthesia Physical Anesthesia Plan  ASA: II  Anesthesia Plan: General   Post-op Pain Management:    Induction:   PONV Risk Score and Plan: 3 and Ondansetron, Dexamethasone and Midazolam  Airway Management Planned: LMA  Additional Equipment:   Intra-op Plan:   Post-operative Plan: Extubation in OR  Informed Consent: I have reviewed the patients History and Physical, chart, labs and discussed the procedure including the risks, benefits and alternatives for the proposed anesthesia with the patient or authorized representative who has indicated his/her understanding and acceptance.     Dental advisory given  Plan Discussed with: CRNA and Anesthesiologist  Anesthesia Plan Comments:         Anesthesia Quick Evaluation

## 2020-07-09 ENCOUNTER — Encounter (HOSPITAL_BASED_OUTPATIENT_CLINIC_OR_DEPARTMENT_OTHER): Payer: Self-pay | Admitting: Surgery

## 2020-07-09 LAB — SURGICAL PATHOLOGY

## 2020-07-16 ENCOUNTER — Encounter: Payer: Self-pay | Admitting: *Deleted

## 2020-07-22 ENCOUNTER — Ambulatory Visit: Payer: Medicare Other | Admitting: Oncology

## 2020-07-22 ENCOUNTER — Other Ambulatory Visit: Payer: Medicare Other

## 2020-07-22 DIAGNOSIS — C50219 Malignant neoplasm of upper-inner quadrant of unspecified female breast: Secondary | ICD-10-CM | POA: Diagnosis not present

## 2020-07-22 DIAGNOSIS — Z170 Estrogen receptor positive status [ER+]: Secondary | ICD-10-CM | POA: Diagnosis not present

## 2020-07-23 ENCOUNTER — Telehealth: Payer: Self-pay | Admitting: *Deleted

## 2020-07-23 ENCOUNTER — Encounter (HOSPITAL_COMMUNITY): Payer: Self-pay | Admitting: Hematology

## 2020-07-23 NOTE — Telephone Encounter (Signed)
Received oncotype score of 33. Physician team notified. Called pt to discuss results and f/u appt with Dr. Burr Medico. Vm left with contact information to return call.

## 2020-07-24 ENCOUNTER — Telehealth: Payer: Self-pay | Admitting: *Deleted

## 2020-07-24 NOTE — Telephone Encounter (Signed)
Called pt to discuss Oncotype results and appt to see Dr. Burr Medico. Left vm with contact information for return call.

## 2020-07-25 ENCOUNTER — Encounter: Payer: Self-pay | Admitting: *Deleted

## 2020-07-25 ENCOUNTER — Telehealth: Payer: Self-pay | Admitting: *Deleted

## 2020-07-25 NOTE — Telephone Encounter (Signed)
Scheduled and confirmed appt with Dr. Burr Medico on 9/27 at 8am.

## 2020-07-26 NOTE — Progress Notes (Signed)
[place]   [phone] Fax:([phone]   Clinic Follow up Note   Patient Care Team: Asencion Noble, MD as PCP - General (Internal Medicine) Rockwell Germany, RN as Oncology Nurse Navigator Mauro Kaufmann, RN as Oncology Nurse Navigator Alphonsa Overall, MD as Consulting Physician (General Surgery) Kyung Rudd, MD as Consulting Physician (Radiation Oncology) Truitt Merle, MD as Consulting Physician (Hematology) Alla Feeling, NP as Nurse Practitioner (Nurse Practitioner)  Date of Service:  07/29/2020  CHIEF COMPLAINT: F/u of left breast cancer   SUMMARY OF ONCOLOGIC HISTORY: Oncology History Overview Note  Cancer Staging Malignant neoplasm of upper-inner quadrant of female breast [facility]) Staging form: Breast, AJCC 8th Edition - Clinical stage from 06/11/2020: Stage IA (cT1b, cN0, cM0, G2, ER+, PR-, HER2: Equivocal) - Signed by Alla Feeling, NP on 07/01/2020 - Pathologic stage from 07/05/2020: Stage IA (pT1c, pN0, cM0, G2, ER+, PR-, HER2-, Oncotype DX score: 33) - Signed by Truitt Merle, MD on 07/29/2020    Malignant neoplasm of upper-inner quadrant of female breast ([place])  05/28/2020 Mammogram   Diagnostic left mammogram/US  -On physical exam, I palpate discrete focal soft thickening in the 10-11 o'clock location of the LEFT breast. -Targeted ultrasound is performed, showing an irregular taller than wide hypoechoic mass with indistinct margins in the 10 o'clock location of the LEFT breast 8 centimeters from the nipple. There is associated posterior acoustic shadowing. No significant internal blood flow identified on Doppler evaluation.  IMPRESSION: Suspicious mass in the 10 o'clock location of the LEFT breast. No LEFT axillary adenopathy.     06/11/2020 Cancer Staging   Staging form: Breast, AJCC 8th Edition - Clinical stage from 06/11/2020: Stage IA (cT1b, cN0, cM0, G2, ER+, PR-, HER2: Equivocal) - Signed by Alla Feeling, NP on 07/01/2020   06/11/2020  Initial Biopsy   FINAL MICROSCOPIC DIAGNOSIS: A. BREAST, 10:00, LEFT, BIOPSY: - Invasive ductal carcinoma The carcinoma appears Nottingham grade 1-2 of 3 and measures 0.9 cm in greatest linear extent  PROGNOSTIC INDICATOR RESULTS: The tumor cells are EQUIVOCAL for Her2 (2+) Estrogen Receptor: 90%, MODERATE STAINING INTENSITY Progesterone Receptor: NEGATIVE Proliferation Marker Ki-67: 5%  FLOURESCENCE IN-SITU HYBRIDIZATION RESULTS: GROUP 5: HER2 **NEGATIVE** RATIO of HER2/CEN 17 SIGNALS: 1.18 AVERAGE HER2 COPY NUMBER PER CELL: 1.30   07/01/2020 Initial Diagnosis   Malignant neoplasm of upper-inner quadrant of female breast ([place])   07/05/2020 Surgery   LEFT BREAST LUMPECTOMY WITH RADIOACTIVE SEED AND LEFT AXILLARY SENTINEL LYMPH NODE BIOPSY by Dr Lucia Gaskins    07/05/2020 Pathology Results   FINAL MICROSCOPIC DIAGNOSIS:   A. BREAST, LEFT, LUMPECTOMY:  - Invasive ductal carcinoma, 1.8 cm.  - Margins not involved.  - Biopsy site and biopsy clip.  - See oncology table.   B. LYMPH NODE, LEFT AXILLARY, SENTINEL, EXCISION:  - One lymph node with no metastatic carcinoma (0/1).    07/05/2020 Oncotype testing   Oncotype  Recurrence Score 33 Distant recurrence risk at 9 years with Tamoxifen alone is 21% There is >15% benefit of adjuvant chemotherapy.    07/05/2020 Cancer Staging   Staging form: Breast, AJCC 8th Edition - Pathologic stage from 07/05/2020: Stage IA (pT1c, pN0, cM0, G2, ER+, PR-, HER2-, Oncotype DX score: 33) - Signed by Truitt Merle, MD on 07/29/2020   08/08/2020 -  Chemotherapy   The patient had PACLitaxel (TAXOL) 138 mg in sodium chloride 0.9 % 250 mL chemo infusion (</= 71m/m2), 80 mg/m2 = 138 mg, Intravenous,  Once, 0 of 12 cycles  for chemotherapy treatment.       CURRENT THERAPY:  Pending adjuvant chemo   INTERVAL HISTORY:  Doris Ellis is here for a follow up of left breast cancer after surgery. She presents to the clinic alone.  She underwent left breast lumpectomy and  sentinel lymph node biopsy on August 01, 2020, she tolerated surgery well, and has recovered well.  She denies significant pain at the incision sites, no left arm edema or limited range of motion of left shoulder.  She has good appetite and energy level, lives alone and functions very well at home.  Review of system otherwise negative. Doris Ellis  MEDICAL HISTORY:  Past Medical History:  Diagnosis Date  . Anxiety   . Arthritis   . Cancer ([place]) 05/2020   left breast IDC  . Concussion 1985  . Depression   . Hyperlipidemia   . Hypertension   . Mild concussion 2002   tripped over bag at work  . Osteopenia 07/2011   T score -1.8 FRAX 11%/1.5%  . Panic attacks   . Seasonal allergies     SURGICAL HISTORY: Past Surgical History:  Procedure Laterality Date  . ABDOMINAL HYSTERECTOMY  april 1990   TAH BSO  . BACK SURGERY  1996   ruputured disc L-5  . BLADDER SUSPENSION  1982  . BREAST BIOPSY Left   . BREAST LUMPECTOMY WITH RADIOACTIVE SEED AND SENTINEL LYMPH NODE BIOPSY Left 07/05/2020   Procedure: LEFT BREAST LUMPECTOMY WITH RADIOACTIVE SEED AND LEFT AXILLARY SENTINEL LYMPH NODE BIOPSY;  Surgeon: Alphonsa Overall, MD;  Location: [facility];  Service: General;  Laterality: Left;  LEFT BREAST CANCER  . BREAST SURGERY  1984   cyst-benign  . COLONOSCOPY N/A 03/30/2013   Procedure: COLONOSCOPY;  Surgeon: Rogene Houston, MD;  [facility];  Service: Endoscopy;  Laterality: N/A;  930  . DILATION AND CURETTAGE OF UTERUS  12/09/1988   abnormal uterine bleeding    I have reviewed the social history and family history with the patient and they are unchanged from previous note.  ALLERGIES:  is allergic to lipitor [atorvastatin calcium], zocor [simvastatin - high dose], and benadryl [diphenhydramine].  MEDICATIONS:  Current Outpatient Medications  Medication Sig Dispense Refill  . ALPRAZolam (XANAX) 1 MG tablet Take 1 mg by mouth at bedtime as needed for sleep.    Doris Ellis amLODipine  (NORVASC) 2.5 MG tablet Take 2.5 mg by mouth daily.    . cyclobenzaprine (FLEXERIL) 10 MG tablet Take 10 mg by mouth 3 (three) times daily as needed (muscle spasms).     . fluticasone (FLONASE) 50 MCG/ACT nasal spray Place into both nostrils daily.    Doris Ellis lidocaine-prilocaine (EMLA) cream Apply to affected area once 30 g 3  . multivitamin (THERAGRAN) per tablet Take 1 tablet by mouth daily.      . naproxen sodium (ALEVE) 220 MG tablet Take 220 mg by mouth.    . ondansetron (ZOFRAN) 8 MG tablet Take 1 tablet (8 mg total) by mouth 2 (two) times daily as needed (Nausea or vomiting). 30 tablet 1  . pravastatin (PRAVACHOL) 10 MG tablet Take 10 mg by mouth daily.      . prochlorperazine (COMPAZINE) 10 MG tablet Take 1 tablet (10 mg total) by mouth every 6 (six) hours as needed (Nausea or vomiting). 30 tablet 1  . Pyridoxine HCl (VITAMIN B-6 PO) Take 1 tablet by mouth daily.     . sertraline (ZOLOFT) 50 MG tablet Take 50 mg by  mouth daily.    . traMADol (ULTRAM) 50 MG tablet Take 1 tablet (50 mg total) by mouth every 6 (six) hours as needed for moderate pain or severe pain. 12 tablet 0   No current facility-administered medications for this visit.    PHYSICAL EXAMINATION: ECOG PERFORMANCE STATUS: 0 - Asymptomatic  Vitals:   07/29/20 0820  BP: 138/67  Pulse: 66  Resp: 20  Temp: 98.1 F (36.7 C)  SpO2: 98%   Filed Weights   07/29/20 0820  Weight: 139 lb 6.4 oz (63.2 kg)    GENERAL:alert, no distress and comfortable SKIN: skin color, texture, turgor are normal, no rashes or significant lesions EYES: normal, Conjunctiva are pink and non-injected, sclera clear NECK: supple, thyroid normal size, non-tender, without nodularity LYMPH:  no palpable lymphadenopathy in the cervical, axillary  LUNGS: clear to auscultation and percussion with normal breathing effort HEART: regular rate & rhythm and no murmurs and no lower extremity edema ABDOMEN:abdomen soft, non-tender and normal bowel  sounds Musculoskeletal:no cyanosis of digits and no clubbing  NEURO: alert & oriented x 3 with fluent speech, no focal motor/sensory deficits Breasts: Breast inspection showed them to be symmetrical with no nipple discharge.  The incision in left breast and her left axilla have healed well, with moderate scar tissue underneath.  Palpation of the right breast and axilla revealed no obvious mass that I could appreciate.   LABORATORY DATA:  I have reviewed the data as listed No flowsheet data found.   No flowsheet data found.    RADIOGRAPHIC STUDIES: I have personally reviewed the radiological images as listed and agreed with the findings in the report. No results found.   ASSESSMENT & PLAN:  Doris Ellis is a 79 y.o. female with    1. Left breast cancer, grade 1-2, ER+/PR-/HER2- Ki-67 5%, pT1cN0M0, Grade 1, stage IA, RS 33,  -She was diagnosed in 06/2020 with grade 1-2 invasive ductal carcinoma of the left breast.  -She underwent left lumpectomy with SLNB with Dr Lucia Gaskins on 07/05/20. We discussed her pathology which showed a 1.8 cm tumor with 1 - lymph nodes.  Surgical margins were negative. -Given the early stage disease, she does not need staging scans. -We also discussed her Oncotype Dx testing of surgical sample which showed high risk disease with recurrence score of 33.  The estimated distant recurrence is 21% in the next 9 years. -I discussed adjuvant chemotherapy to reduce her risk of future recurrence, given the high risk disease.  We discussed the option of ddAC-T, TC and weekly Taxol.  She is 79 year old, also functions very well at home, and has good baseline health, she has very limited social support.  I recommend weekly paclitaxel for 12 weeks as her adjuvant chemotherapy. --Chemotherapy consent: Side effects including but does not not limited to, fatigue, nausea, vomiting, diarrhea, hair loss, peripheral neuropathy, fluid retention, renal and kidney dysfunction, neutropenic  fever, needed for blood transfusion, bleeding, were discussed with patient in great detail. She agrees to proceed.  I also spoke with her daughter on the phone, she encouraged patient to consider. -The goal of therapy is curative -We will refer her to our neuropathy study -I also discussed the benefit of adjuvant radiation and adjuvant antiestrogen therapy.  She is interested. -Plan to start adjuvant chemo next week   2. Osteopenia  -DEXA on 04/01/2017 shows osteopenia in the right femur neck with T score -1.6. Her FRAX score shows a 16.9% probability of a major osteoporotic fracture in 10  years and 3.1% risk for hip fracture -We encouraged her to begin calcium and vitamin D once daily and continue weightbearing exercise -Will repeat before starting adjuvant antiestrogen therapy   PLAN: -Surgical pathology and Oncotype DX test results were discussed with patient in detail. -Plan to start adjuvant chemotherapy with weekly paclitaxel for 12 weeks next week -Class before first infusion -We will follow up closely -refer her to neuropathy clinical trial    No problem-specific Assessment & Plan notes found for this encounter.   Orders Placed This Encounter  Procedures  . CBC with Differential    Standing Status:   Standing    Number of Occurrences:   20    Standing Expiration Date:   07/29/2021  . Comprehensive metabolic panel    Standing Status:   Standing    Number of Occurrences:   20    Standing Expiration Date:   07/29/2021   All questions were answered. The patient knows to call the clinic with any problems, questions or concerns. No barriers to learning was detected. The total time spent in the appointment was 40 minutes.     Truitt Merle, MD 07/29/2020   I, Joslyn Devon, am acting as scribe for Truitt Merle, MD.   I have reviewed the above documentation for accuracy and completeness, and I agree with the above.

## 2020-07-29 ENCOUNTER — Encounter: Payer: Self-pay | Admitting: Medical Oncology

## 2020-07-29 ENCOUNTER — Other Ambulatory Visit: Payer: Self-pay

## 2020-07-29 ENCOUNTER — Inpatient Hospital Stay: Payer: Medicare Other | Attending: Nurse Practitioner | Admitting: Hematology

## 2020-07-29 ENCOUNTER — Encounter: Payer: Self-pay | Admitting: Hematology

## 2020-07-29 VITALS — BP 138/67 | HR 66 | Temp 98.1°F | Resp 20 | Ht 64.0 in | Wt 139.4 lb

## 2020-07-29 DIAGNOSIS — Z79899 Other long term (current) drug therapy: Secondary | ICD-10-CM | POA: Insufficient documentation

## 2020-07-29 DIAGNOSIS — E785 Hyperlipidemia, unspecified: Secondary | ICD-10-CM | POA: Insufficient documentation

## 2020-07-29 DIAGNOSIS — C50212 Malignant neoplasm of upper-inner quadrant of left female breast: Secondary | ICD-10-CM | POA: Diagnosis not present

## 2020-07-29 DIAGNOSIS — Z90710 Acquired absence of both cervix and uterus: Secondary | ICD-10-CM | POA: Insufficient documentation

## 2020-07-29 DIAGNOSIS — M85851 Other specified disorders of bone density and structure, right thigh: Secondary | ICD-10-CM | POA: Diagnosis not present

## 2020-07-29 DIAGNOSIS — F419 Anxiety disorder, unspecified: Secondary | ICD-10-CM | POA: Diagnosis not present

## 2020-07-29 DIAGNOSIS — G629 Polyneuropathy, unspecified: Secondary | ICD-10-CM | POA: Insufficient documentation

## 2020-07-29 DIAGNOSIS — Z23 Encounter for immunization: Secondary | ICD-10-CM

## 2020-07-29 DIAGNOSIS — I10 Essential (primary) hypertension: Secondary | ICD-10-CM | POA: Diagnosis not present

## 2020-07-29 DIAGNOSIS — Z170 Estrogen receptor positive status [ER+]: Secondary | ICD-10-CM

## 2020-07-29 DIAGNOSIS — Z9079 Acquired absence of other genital organ(s): Secondary | ICD-10-CM | POA: Diagnosis not present

## 2020-07-29 DIAGNOSIS — F329 Major depressive disorder, single episode, unspecified: Secondary | ICD-10-CM | POA: Diagnosis not present

## 2020-07-29 DIAGNOSIS — Z90722 Acquired absence of ovaries, bilateral: Secondary | ICD-10-CM | POA: Insufficient documentation

## 2020-07-29 MED ORDER — LIDOCAINE-PRILOCAINE 2.5-2.5 % EX CREA: TOPICAL_CREAM | CUTANEOUS | 3 refills | Status: DC

## 2020-07-29 MED ORDER — ONDANSETRON HCL 8 MG PO TABS: 8.0000 mg | ORAL_TABLET | Freq: Two times a day (BID) | ORAL | 1 refills | Status: DC | PRN

## 2020-07-29 MED ORDER — INFLUENZA VAC A&B SA ADJ QUAD 0.5 ML IM PRSY
0.5000 mL | PREFILLED_SYRINGE | Freq: Once | INTRAMUSCULAR | Status: AC
  Administered 2020-07-29: 0.5 mL via INTRAMUSCULAR

## 2020-07-29 MED ORDER — PROCHLORPERAZINE MALEATE 10 MG PO TABS: 10.0000 mg | ORAL_TABLET | Freq: Four times a day (QID) | ORAL | 1 refills | Status: DC | PRN

## 2020-07-29 MED ORDER — INFLUENZA VAC A&B SA ADJ QUAD 0.5 ML IM PRSY
PREFILLED_SYRINGE | INTRAMUSCULAR | Status: AC
  Filled 2020-07-29: qty 0.5

## 2020-07-29 NOTE — Progress Notes (Unsigned)
DCP-001, Use of a Clinical Trial Screening Tool to Address Cancer Health Disparities in the [employer] Community Oncology Research Program (NCORP). ° °Patient was referred to S1714 and is eligible for the DCP-001 study.  °I met with patient this morning, here alone, in exam room after her scheduled appointment with Dr. Feng. I provided patient with a description of what the purpose of this stud is and what it entails. Patient was provided with the consent and authorization forms and I explained to her that she is eligible to participate in this study, regardless of her decision to participate in S1714. Patient was informed that this study involves a one time consent, and a collection of demographic variables, with the majority of the data being collected from her medical record. Patient was made aware that no patient identifiers are being reported via the screening tool. Patient was provided with my contact information and encouraged to call with questions. Patient thanked for her time and interest.  °Mirjana C. Leonetti, RN, BSN, CCRC °Clinical Research °07/29/2020 10:41 AM ° °

## 2020-07-29 NOTE — Progress Notes (Signed)
R5615: Studying how cancer treatment affects the nerves in your hands and feet  Dr.Feng referred patient to study this morning. I met with patient, here alone in exam room, after her appointment with Dr. Burr Medico. I reviewed this study with the patient and what the purpose of this study is. I also informed the patient of what the assessments entail and what would be expected of her, should she choose to participate, patient gave verbal understanding of this. Patient is aware that participation in study is voluntary and all her questions were answered to her satisfaction. I also informed her that signing of consent and baseline assessments would need to be completed prior to her first taxol treatment. Patient was provided with the study consent and authorization forms to take home and review. Per Dr. Burr Medico, patient to be scheduled for start of taxol treatment sometime toward the end of next week. I provided patient with my contact information as well for further questions she may have. Patient encouraged to call me with questions, otherwise I will contact her, once her chemo education class is scheduled and see if she is interested in participating with study. Patient denied further questions at this time. I thanked patient and encouraged her to call me. Maxwell Marion, RN, BSN, [facility] Clinical Research 07/29/2020 9:57 AM   Follow up call to patient this afternoon to see if she had any questions regarding the study. Patient states that she had forgotten the information, study consent and authorization forms, that we reviewed together and I provided to her on the 27th, in one of our clinic bathrooms on her way out. Patient states she tried calling and leaving a message with the clinic and no one returned her call. I apologized to the patient for lack of return call from the clinic. Patient states that she is overwhelmed and stressed with all her scheduled appointments and is unsure of when they are. I gave patient  an update to the scheduled appointments in Epic. Patient expressed concern and said she was on the verge of tears because she doesn't have transportation and everyone she knows works and is unable to bring her to appointment. Patient is unsure how she will attend these appointments. I asked her if she was ok with me reaching out to the breast nurse navigators to see if there are any resources to help her with this, patient stated she was ok with this and is free tomorrow to talk. I thanked patient for her time and encouraged her to contact me with questions.  Nurse Henry, informed and confirmed that she will contact patient tomorrow.  Maxwell Marion, RN, BSN, [facility] Clinical Research 08/01/2020 2:18 PM  Follow-up call with patient. Patient scheduled for chemo education tomorrow at 2 PM. I inquired with patient if she was interested in participating in study. Patient asked about the details of the study again and I gave her a description of the study and what the assessments and time points are. Patient and I agreed to meet tomorrow after her chemo education appointment is complete. I thanked patient for her time and encouraged her to call me with questions in the meantime.  Patient is scheduled to start treatment on 08/09/2020. Maxwell Marion, RN, BSN, [facility] Clinical Research 08/07/2020 4:32 PM

## 2020-07-29 NOTE — Progress Notes (Signed)
START OFF PATHWAY REGIMEN - Breast   OFF00010:Paclitaxel 80 mg/m2 Weekly:   Administer weekly:     Paclitaxel   **Always confirm dose/schedule in your pharmacy ordering system**  Patient Characteristics: Postoperative without Neoadjuvant Therapy (Pathologic Staging), Invasive Disease, Adjuvant Therapy, HER2 Negative/Unknown/Equivocal, ER Positive, Node Negative, pT1a-c, pN0/N58m or pT2 or Higher, pN0, Oncotype High Risk (? 26) Therapeutic Status: Postoperative without Neoadjuvant Therapy (Pathologic Staging) AJCC Grade: G2 AJCC N Category: pN0 AJCC M Category: cM0 ER Status: Positive (+) AJCC 8 Stage Grouping: IA HER2 Status: Negative (-) Oncotype Dx Recurrence Score: 33 AJCC T Category: pT1c PR Status: Negative (-) Has this patient completed genomic testing<= Yes - Oncotype DX(R) Intent of Therapy: Curative Intent, Discussed with Patient

## 2020-07-30 ENCOUNTER — Telehealth: Payer: Self-pay | Admitting: Hematology

## 2020-07-30 ENCOUNTER — Encounter: Payer: Self-pay | Admitting: *Deleted

## 2020-07-30 ENCOUNTER — Encounter: Payer: Self-pay | Admitting: Hematology

## 2020-07-30 NOTE — Telephone Encounter (Signed)
Scheduled per 9/27 los. Pt is aware of appt times and dates. Noted to give pt appt calendar on next visit per pt request.

## 2020-07-31 ENCOUNTER — Other Ambulatory Visit: Payer: Self-pay | Admitting: *Deleted

## 2020-07-31 DIAGNOSIS — C50212 Malignant neoplasm of upper-inner quadrant of left female breast: Secondary | ICD-10-CM

## 2020-08-01 ENCOUNTER — Telehealth: Payer: Self-pay | Admitting: *Deleted

## 2020-08-01 ENCOUNTER — Other Ambulatory Visit: Payer: Self-pay | Admitting: Radiology

## 2020-08-01 NOTE — Telephone Encounter (Signed)
Received notification pt needs assistance with transportation. Transportation assistance request sent.

## 2020-08-01 NOTE — Telephone Encounter (Signed)
Called pt with appt for port placement and chemo class. Confirmed port placement by IR on 08/05/20 arrive at 12pm, informed NPO aft 7am and will need a driver Confirmed chemo class on 10/7 at 2pm. No further needs voiced encourage pt to call with questions. Contact information provided.

## 2020-08-05 ENCOUNTER — Other Ambulatory Visit: Payer: Self-pay | Admitting: Hematology

## 2020-08-05 ENCOUNTER — Ambulatory Visit (HOSPITAL_COMMUNITY)
Admission: RE | Admit: 2020-08-05 | Discharge: 2020-08-05 | Disposition: A | Discharge status: Home / Self Care | Payer: Medicare Other | Source: Ambulatory Visit | Attending: Hematology | Admitting: Hematology

## 2020-08-05 ENCOUNTER — Inpatient Hospital Stay: Payer: Medicare Other

## 2020-08-05 ENCOUNTER — Other Ambulatory Visit: Payer: Self-pay

## 2020-08-05 ENCOUNTER — Encounter (HOSPITAL_COMMUNITY): Payer: Self-pay

## 2020-08-05 DIAGNOSIS — I10 Essential (primary) hypertension: Secondary | ICD-10-CM | POA: Insufficient documentation

## 2020-08-05 DIAGNOSIS — Z9012 Acquired absence of left breast and nipple: Secondary | ICD-10-CM | POA: Diagnosis not present

## 2020-08-05 DIAGNOSIS — E785 Hyperlipidemia, unspecified: Secondary | ICD-10-CM | POA: Insufficient documentation

## 2020-08-05 DIAGNOSIS — F329 Major depressive disorder, single episode, unspecified: Secondary | ICD-10-CM | POA: Diagnosis not present

## 2020-08-05 DIAGNOSIS — Z452 Encounter for adjustment and management of vascular access device: Secondary | ICD-10-CM | POA: Diagnosis not present

## 2020-08-05 DIAGNOSIS — F419 Anxiety disorder, unspecified: Secondary | ICD-10-CM | POA: Diagnosis not present

## 2020-08-05 DIAGNOSIS — C50912 Malignant neoplasm of unspecified site of left female breast: Secondary | ICD-10-CM | POA: Insufficient documentation

## 2020-08-05 DIAGNOSIS — C50212 Malignant neoplasm of upper-inner quadrant of left female breast: Secondary | ICD-10-CM

## 2020-08-05 DIAGNOSIS — Z79899 Other long term (current) drug therapy: Secondary | ICD-10-CM | POA: Insufficient documentation

## 2020-08-05 DIAGNOSIS — Z170 Estrogen receptor positive status [ER+]: Secondary | ICD-10-CM

## 2020-08-05 HISTORY — PX: IR IMAGING GUIDED PORT INSERTION: IMG5740

## 2020-08-05 LAB — CBC WITH DIFFERENTIAL/PLATELET
Abs Immature Granulocytes: 0.05 10*3/uL (ref 0.00–0.07)
Basophils Absolute: 0.1 10*3/uL (ref 0.0–0.1)
Basophils Relative: 1 %
Eosinophils Absolute: 0.2 10*3/uL (ref 0.0–0.5)
Eosinophils Relative: 2 %
HCT: 39.2 % (ref 36.0–46.0)
Hemoglobin: 13 g/dL (ref 12.0–15.0)
Immature Granulocytes: 1 %
Lymphocytes Relative: 20 %
Lymphs Abs: 1.7 10*3/uL (ref 0.7–4.0)
MCH: 30.2 pg (ref 26.0–34.0)
MCHC: 33.2 g/dL (ref 30.0–36.0)
MCV: 91 fL (ref 80.0–100.0)
Monocytes Absolute: 0.6 10*3/uL (ref 0.1–1.0)
Monocytes Relative: 8 %
Neutro Abs: 5.6 10*3/uL (ref 1.7–7.7)
Neutrophils Relative %: 68 %
Platelets: 430 10*3/uL — ABNORMAL HIGH (ref 150–400)
RBC: 4.31 MIL/uL (ref 3.87–5.11)
RDW: 13.1 % (ref 11.5–15.5)
WBC: 8.2 10*3/uL (ref 4.0–10.5)
nRBC: 0 % (ref 0.0–0.2)

## 2020-08-05 LAB — BASIC METABOLIC PANEL
Anion gap: 14 (ref 5–15)
BUN: 14 mg/dL (ref 8–23)
CO2: 23 mmol/L (ref 22–32)
Calcium: 9.6 mg/dL (ref 8.9–10.3)
Chloride: 99 mmol/L (ref 98–111)
Creatinine, Ser: 0.72 mg/dL (ref 0.44–1.00)
GFR calc Af Amer: >60 mL/min (ref >60)
GFR calc non Af Amer: >60 mL/min (ref >60)
Glucose, Bld: 113 mg/dL — ABNORMAL HIGH (ref 70–99)
Potassium: 4.1 mmol/L (ref 3.5–5.1)
Sodium: 136 mmol/L (ref 135–145)

## 2020-08-05 LAB — PROTIME-INR
INR: 1 (ref 0.8–1.2)
Prothrombin Time: 13 seconds (ref 11.4–15.2)

## 2020-08-05 MED ORDER — MIDAZOLAM HCL 2 MG/2ML IJ SOLN
INTRAMUSCULAR | Status: AC | PRN
  Administered 2020-08-05: 1 mg via INTRAVENOUS

## 2020-08-05 MED ORDER — HEPARIN SOD (PORK) LOCK FLUSH 100 UNIT/ML IV SOLN
INTRAVENOUS | Status: AC
  Filled 2020-08-05: qty 5

## 2020-08-05 MED ORDER — FENTANYL CITRATE (PF) 100 MCG/2ML IJ SOLN
INTRAMUSCULAR | Status: DC
Start: 2020-08-05 — End: 2020-08-06
  Filled 2020-08-05: qty 2

## 2020-08-05 MED ORDER — MIDAZOLAM HCL 2 MG/2ML IJ SOLN
INTRAMUSCULAR | Status: AC
  Filled 2020-08-05: qty 4

## 2020-08-05 MED ORDER — LIDOCAINE-EPINEPHRINE 1 %-1:100000 IJ SOLN
INTRAMUSCULAR | Status: AC | PRN
  Administered 2020-08-05: 20 mL

## 2020-08-05 MED ORDER — CEFAZOLIN SODIUM-DEXTROSE 2-4 GM/100ML-% IV SOLN: 2.0000 g | INTRAVENOUS | Status: AC

## 2020-08-05 MED ORDER — SODIUM CHLORIDE 0.9 % IV SOLN: INTRAVENOUS | Status: DC

## 2020-08-05 MED ORDER — CEFAZOLIN SODIUM-DEXTROSE 2-4 GM/100ML-% IV SOLN
INTRAVENOUS | Status: AC
  Administered 2020-08-05: 2 g via INTRAVENOUS
  Filled 2020-08-05: qty 100

## 2020-08-05 MED ORDER — LIDOCAINE-EPINEPHRINE 1 %-1:100000 IJ SOLN
INTRAMUSCULAR | Status: AC
  Filled 2020-08-05: qty 1

## 2020-08-05 MED ORDER — FENTANYL CITRATE (PF) 100 MCG/2ML IJ SOLN
INTRAMUSCULAR | Status: AC | PRN
  Administered 2020-08-05: 50 ug via INTRAVENOUS

## 2020-08-05 NOTE — Discharge Instructions (Signed)

## 2020-08-05 NOTE — Consult Note (Signed)
Chief Complaint: Patient was seen in consultation today for Port-A-Cath placement  Referring Physician(s): Feng,Yan  Supervising Physician: Arne Cleveland  Patient Status: [place]  History of Present Illness: Doris Ellis is a 79 y.o. female with history of left breast carcinoma diagnosed in August of this year, status post left lumpectomy with radioactive seed implant and left axillary sentinel lymph node biopsy on 07/05/2020.  She presents today for Port-A-Cath placement for chemotherapy.   Past Medical History:  Diagnosis Date  . Anxiety   . Arthritis   . Cancer ([place]) 05/2020   left breast IDC  . Concussion 1985  . Depression   . Hyperlipidemia   . Hypertension   . Mild concussion 2002   tripped over bag at work  . Osteopenia 07/2011   T score -1.8 FRAX 11%/1.5%  . Panic attacks   . Seasonal allergies     Past Surgical History:  Procedure Laterality Date  . ABDOMINAL HYSTERECTOMY  april 1990   TAH BSO  . BACK SURGERY  1996   ruputured disc L-5  . BLADDER SUSPENSION  1982  . BREAST BIOPSY Left   . BREAST LUMPECTOMY WITH RADIOACTIVE SEED AND SENTINEL LYMPH NODE BIOPSY Left 07/05/2020   Procedure: LEFT BREAST LUMPECTOMY WITH RADIOACTIVE SEED AND LEFT AXILLARY SENTINEL LYMPH NODE BIOPSY;  Surgeon: Alphonsa Overall, MD;  Location: [facility];  Service: General;  Laterality: Left;  LEFT BREAST CANCER  . BREAST SURGERY  1984   cyst-benign  . COLONOSCOPY N/A 03/30/2013   Procedure: COLONOSCOPY;  Surgeon: Rogene Houston, MD;  [facility];  Service: Endoscopy;  Laterality: N/A;  930  . DILATION AND CURETTAGE OF UTERUS  12/09/1988   abnormal uterine bleeding    Allergies: Lipitor [atorvastatin calcium], Zocor [simvastatin - high dose], and Benadryl [diphenhydramine]  Medications: Prior to Admission medications   Medication Sig Start Date End Date Taking? Authorizing Provider  ALPRAZolam Duanne Moron) 1 MG tablet Take 1 mg by mouth at bedtime  as needed for sleep.   Yes [provider]  amLODipine (NORVASC) 2.5 MG tablet Take 2.5 mg by mouth daily.   Yes [provider]  cyclobenzaprine (FLEXERIL) 10 MG tablet Take 10 mg by mouth 3 (three) times daily as needed (muscle spasms).    Yes [provider]  fluticasone (FLONASE) 50 MCG/ACT nasal spray Place into both nostrils daily.   Yes [provider]  multivitamin [facility]) per tablet Take 1 tablet by mouth daily.     Yes [provider]  naproxen sodium (ALEVE) 220 MG tablet Take 220 mg by mouth.   Yes [provider]  pravastatin (PRAVACHOL) 10 MG tablet Take 10 mg by mouth daily.     Yes [provider]  Pyridoxine HCl (VITAMIN B-6 PO) Take 1 tablet by mouth daily.    Yes [provider]  sertraline (ZOLOFT) 50 MG tablet Take 50 mg by mouth daily.   Yes [provider]  lidocaine-prilocaine (EMLA) cream Apply to affected area once 07/29/20   Truitt Merle, MD  ondansetron (ZOFRAN) 8 MG tablet Take 1 tablet (8 mg total) by mouth 2 (two) times daily as needed (Nausea or vomiting). 07/29/20   Truitt Merle, MD  prochlorperazine (COMPAZINE) 10 MG tablet Take 1 tablet (10 mg total) by mouth every 6 (six) hours as needed (Nausea or vomiting). 07/29/20   Truitt Merle, MD  traMADol (ULTRAM) 50 MG tablet Take 1 tablet (50 mg total) by mouth every 6 (  six) hours as needed for moderate pain or severe pain. 07/05/20   Alphonsa Overall, MD     Family History  Problem Relation Age of Onset  . Cancer Father        non-hogkins lymphoma, & CLL  . Diabetes Maternal Uncle   . Hypertension Maternal Grandmother   . Heart disease Maternal Grandfather   . Cancer Maternal Grandfather        lung cancer  . Osteoporosis Maternal Aunt     Social History   Socioeconomic History  . Marital status: Divorced    Spouse name: Not on file  . Number of children: Not on file  . Years of education: Not on file  . Highest education level: Not on  file  Occupational History  . Occupation: retired Charity fundraiser, Quarry manager  Tobacco Use  . Smoking status: Never Smoker  . Smokeless tobacco: Never Used  Substance and Sexual Activity  . Alcohol use: Yes    Comment: social  . Drug use: Yes    Types: Marijuana    Comment: occasionally  . Sexual activity: Not Currently    Partners: Male    Birth control/protection: Surgical    Comment: hyst  Other Topics Concern  . Not on file  Social History Narrative  . Not on file   Social Determinants of Health   Financial Resource Strain: Medium Risk  . Difficulty of Paying Living Expenses: Somewhat hard  Food Insecurity: No Food Insecurity  . Worried About Charity fundraiser in the Last Year: Never true  . Ran Out of Food in the Last Year: Never true  Transportation Needs: No Transportation Needs  . Lack of Transportation (Medical): No  . Lack of Transportation (Non-Medical): No  Physical Activity: Insufficiently Active  . Days of Exercise per Week: 7 days  . Minutes of Exercise per Session: 20 min  Stress:   . Feeling of Stress : Not on file  Social Connections: Unknown  . Frequency of Communication with Friends and Family: More than three times a week  . Frequency of Social Gatherings with Friends and Family: Once a week  . Attends Religious Services: Never  . Active Member of Clubs or Organizations: Not on file  . Attends Archivist Meetings: Not on file  . Marital Status: Not on file      Review of Systems denies fever, headache, chest pain, dyspnea, cough, abdominal/back pain, nausea, vomiting or bleeding; does have some soreness in her left axillary region at site of prior lymph node dissection  Vital Signs: BP 140/73 (BP Location: Left Arm)   Pulse 74   Temp 98.2 F (36.8 C) (Oral)   Resp 18   Ht 5\' 4"  (1.626 m)   Wt 135 lb (61.2 kg)   LMP 03/05/1989   SpO2 99%   BMI 23.17 kg/m   Physical Exam awake, alert.  Chest clear to auscultation bilaterally.   Heart with regular rate and rhythm.  Abdomen soft, positive bowel sounds, nontender.  No lower extremity edema.  Left axillary region with some warmth to skin and erythema in region, slightly tender to palpation?  cellulitis  Imaging: No results found.  Labs:  CBC: Recent Labs    08/05/20 1200  WBC 8.2  HGB 13.0  HCT 39.2  PLT 430*    COAGS: Recent Labs    08/05/20 1200  INR 1.0    BMP: Recent Labs    08/05/20 1200  NA 136  K 4.1  CL  99  CO2 23  GLUCOSE 113*  BUN 14  CALCIUM 9.6  CREATININE 0.72  GFRNONAA >60  GFRAA >60    LIVER FUNCTION TESTS: No results for input(s): BILITOT, AST, ALT, ALKPHOS, PROT, ALBUMIN in the last 8760 hours.  TUMOR MARKERS: No results for input(s): AFPTM, CEA, CA199, CHROMGRNA in the last 8760 hours.  Assessment and Plan: 79 y.o. female with history of left breast carcinoma diagnosed in August of this year, status post left lumpectomy with radioactive seed implant and left axillary sentinel lymph node biopsy on 07/05/2020.  She presents today for Port-A-Cath placement for chemotherapy. ? cellulitis at left axillary region from recent LN dissection. Pt currently afebrile with nl WBC. Pt seen by Dr. Vernard Gambles. Risks and benefits of image guided port-a-catheter placement was discussed with the patient including, but not limited to bleeding, infection, pneumothorax, or fibrin sheath development and need for additional procedures.  All of the patient's questions were answered, patient is agreeable to proceed. Consent signed and in chart.      Thank you for this interesting consult.  I greatly enjoyed meeting Doris Ellis and look forward to participating in their care.  A copy of this report was sent to the requesting provider on this date.  Electronically Signed: D. Rowe Robert, PA-C 08/05/2020, 1:10 PM   I spent a total of  25 minutes   in face to face in clinical consultation, greater than 50% of which was counseling/coordinating care  for port a cath placement

## 2020-08-05 NOTE — Progress Notes (Signed)
Pharmacist Chemotherapy Monitoring - Initial Assessment    Anticipated start date: 08/09/20   Regimen:  . Are orders appropriate based on the patient's diagnosis, regimen, and cycle? Yes . Does the plan date match the patient's scheduled date? Yes . Is the sequencing of drugs appropriate? Yes . Are the premedications appropriate for the patient's regimen? Yes . Prior Authorization for treatment is: Pending o If applicable, is the correct biosimilar selected based on the patient's insurance? not applicable  Organ Function and Labs: Marland Kitchen Are dose adjustments needed based on the patient's renal function, hepatic function, or hematologic function? No . Are appropriate labs ordered prior to the start of patient's treatment? Yes . Other organ system assessment, if indicated: N/A . The following baseline labs, if indicated, have been ordered: N/A  Dose Assessment: . Are the drug doses appropriate? Yes . Are the following correct: o Drug concentrations Yes o IV fluid compatible with drug Yes o Administration routes Yes o Timing of therapy Yes . If applicable, does the patient have documented access for treatment and/or plans for port-a-cath placement? not applicable . If applicable, have lifetime cumulative doses been properly documented and assessed? not applicable Lifetime Dose Tracking  No doses have been documented on this patient for the following tracked chemicals: Doxorubicin, Epirubicin, Idarubicin, Daunorubicin, Mitoxantrone, Bleomycin, Oxaliplatin, Carboplatin, Liposomal Doxorubicin  o   Toxicity Monitoring/Prevention: . The patient has the following take home antiemetics prescribed: Ondansetron, Prochlorperazine and Lorazepam . The patient has the following take home medications prescribed: N/A . Medication allergies and previous infusion related reactions, if applicable, have been reviewed and addressed. Yes . The patient's current medication list has been assessed for drug-drug  interactions with their chemotherapy regimen. no significant drug-drug interactions were identified on review.  Order Review: . Are the treatment plan orders signed? Yes . Is the patient scheduled to see a provider prior to their treatment? Yes  I verify that I have reviewed each item in the above checklist and answered each question accordingly.  Thedore Pickel K 08/05/2020 3:40 PM

## 2020-08-05 NOTE — Procedures (Signed)
  Procedure: R IJ Port catheter placement   EBL:   minimal Complications:  none immediate  See full dictation in [facility] PACS.  D. Manu Rubey MD Main # [phone] Pager  [phone] Mobile [phone]     

## 2020-08-06 ENCOUNTER — Ambulatory Visit: Payer: Medicare Other | Attending: Surgery | Admitting: Physical Therapy

## 2020-08-06 DIAGNOSIS — Z483 Aftercare following surgery for neoplasm: Secondary | ICD-10-CM | POA: Insufficient documentation

## 2020-08-06 NOTE — Therapy (Signed)
[place], [place], [address] Phone: [phone]   Fax:  [phone]  Physical Therapy Re-Evaluation  Patient Details  Name: Doris Ellis MRN: [id number] Date of Birth: 1941-08-19 Referring Provider (PT): Dr. Lucia Gaskins    Encounter Date: 08/06/2020   PT End of Session - 08/06/20 1331    Visit Number 2    Number of Visits 2    Date for PT Re-Evaluation 11/01/20    PT Start Time 2423    PT Stop Time 1331    PT Time Calculation (min) 28 min    Activity Tolerance Patient tolerated treatment well    Behavior During Therapy [facility] for tasks assessed/performed           Past Medical History:  Diagnosis Date  . Anxiety   . Arthritis   . Cancer ([place]) 05/2020   left breast IDC  . Concussion 1985  . Depression   . Hyperlipidemia   . Hypertension   . Mild concussion 2002   tripped over bag at work  . Osteopenia 07/2011   T score -1.8 FRAX 11%/1.5%  . Panic attacks   . Seasonal allergies     Past Surgical History:  Procedure Laterality Date  . ABDOMINAL HYSTERECTOMY  april 1990   TAH BSO  . BACK SURGERY  1996   ruputured disc L-5  . BLADDER SUSPENSION  1982  . BREAST BIOPSY Left   . BREAST LUMPECTOMY WITH RADIOACTIVE SEED AND SENTINEL LYMPH NODE BIOPSY Left 07/05/2020   Procedure: LEFT BREAST LUMPECTOMY WITH RADIOACTIVE SEED AND LEFT AXILLARY SENTINEL LYMPH NODE BIOPSY;  Surgeon: Alphonsa Overall, MD;  Location: [facility];  Service: General;  Laterality: Left;  LEFT BREAST CANCER  . BREAST SURGERY  1984   cyst-benign  . COLONOSCOPY N/A 03/30/2013   Procedure: COLONOSCOPY;  Surgeon: Rogene Houston, MD;  [facility];  Service: Endoscopy;  Laterality: N/A;  930  . DILATION AND CURETTAGE OF UTERUS  12/09/1988   abnormal uterine bleeding  . IR IMAGING GUIDED PORT INSERTION  08/05/2020    There were no vitals filed for this visit.    Subjective Assessment - 08/06/20 1316     Subjective Pt got her port put in yesterday as she will be starting chemo this Friday. She does not yet know about radiation.  Her arm is doing well.  She  developed some redness at her axillary incision since surgery , but she has noticed it has gradually getting worse. She has been doing her exercises at home and the little bit hurting might be coming from the redness and swelling    Pertinent History 06/11/2020: dianosed with neoplasm on left breast, ER+, PR-, HER2-, Ki67 of 5% planning lumpectomy and sentinel node biopsy on 07/05/2020. past history of low back ruptured disc with surgery in 1995 so her back hurts every now and then, past history of lumpectomy in 1985 that was benign Left lumpectomy sept 3 with one lymphedema node removed    Patient Stated Goals to learn what she needs to know    Currently in Pain? Yes    Pain Score 2    from red swollen area   Pain Location Axilla    Pain Orientation Left    Pain Descriptors / Indicators Sore              [facility] PT Assessment - 08/06/20 0001      AROM   Left Shoulder Flexion 165  Degrees    Left Shoulder ABduction 170 Degrees             LYMPHEDEMA/ONCOLOGY QUESTIONNAIRE - 08/06/20 0001      Left Upper Extremity Lymphedema   10 cm Proximal to Olecranon Process 27 cm    Olecranon Process 22.5 cm    15 cm Proximal to Ulnar Styloid Process 22.3 cm    Just Proximal to Ulnar Styloid Process 15 cm    Across Hand at [employer] 18 cm    At [employer] of 2nd Digit 5.5 cm                      Objective measurements completed on examination: See above findings.                    PT Long Term Goals - 08/06/20 1334      PT LONG TERM GOAL #1   Title Pt will have return of left shoulder ROM to baseline after surgery    Time 4    Status Achieved                  Plan - 08/06/20 1331    Clinical Impression Statement Pt comes in to PT with what looks like to be an infection with pus area.  Called Dr.  Pollie Friar office and she got an appointment to see someone there within the hour. Pt has full shoulder ROM and function, with no increase in arm circumferences.  She is taking one day a time and focusing on upcoming chemo.  She does not need PT follow up at this time. She is scheduled to return for SOZO screening and will remind her about ABC class at time    Personal Factors and Comorbidities Comorbidity 2    Comorbidities previous low back sugery, previous left breast surgery    Stability/Clinical Decision Making Stable/Uncomplicated    Rehab Potential Excellent    PT Next Visit Plan SOZO check in December 2021 with reminder about ABC class    Consulted and Agree with Plan of Care Patient           Patient will benefit from skilled therapeutic intervention in order to improve the following deficits and impairments:  Postural dysfunction, Decreased knowledge of precautions  Visit Diagnosis: Aftercare following surgery for neoplasm - Plan: PT plan of care cert/re-cert     Problem List Patient Active Problem List   Diagnosis Date Noted  . Malignant neoplasm of upper-inner quadrant of female breast ([place]) 07/01/2020  . Hyperlipidemia   . Concussion   . Mild concussion    Donato Heinz. Owens Shark PT  Norwood Levo 08/06/2020, 1:37 PM  [place], [place], [address] Phone: [phone]   Fax:  [phone]  Name: Doris Ellis MRN: [id number] Date of Birth: November 07, 1940

## 2020-08-08 ENCOUNTER — Inpatient Hospital Stay: Payer: Medicare Other | Attending: Nurse Practitioner

## 2020-08-08 ENCOUNTER — Other Ambulatory Visit: Payer: Self-pay

## 2020-08-08 ENCOUNTER — Other Ambulatory Visit: Payer: Self-pay | Admitting: Medical Oncology

## 2020-08-08 ENCOUNTER — Encounter: Payer: Self-pay | Admitting: *Deleted

## 2020-08-08 DIAGNOSIS — Z90722 Acquired absence of ovaries, bilateral: Secondary | ICD-10-CM | POA: Insufficient documentation

## 2020-08-08 DIAGNOSIS — Z5111 Encounter for antineoplastic chemotherapy: Secondary | ICD-10-CM | POA: Insufficient documentation

## 2020-08-08 DIAGNOSIS — F419 Anxiety disorder, unspecified: Secondary | ICD-10-CM | POA: Insufficient documentation

## 2020-08-08 DIAGNOSIS — G4700 Insomnia, unspecified: Secondary | ICD-10-CM | POA: Insufficient documentation

## 2020-08-08 DIAGNOSIS — Z90710 Acquired absence of both cervix and uterus: Secondary | ICD-10-CM | POA: Insufficient documentation

## 2020-08-08 DIAGNOSIS — Z170 Estrogen receptor positive status [ER+]: Secondary | ICD-10-CM

## 2020-08-08 DIAGNOSIS — C50212 Malignant neoplasm of upper-inner quadrant of left female breast: Secondary | ICD-10-CM

## 2020-08-08 DIAGNOSIS — M85851 Other specified disorders of bone density and structure, right thigh: Secondary | ICD-10-CM | POA: Insufficient documentation

## 2020-08-08 DIAGNOSIS — F329 Major depressive disorder, single episode, unspecified: Secondary | ICD-10-CM | POA: Insufficient documentation

## 2020-08-08 DIAGNOSIS — Z79899 Other long term (current) drug therapy: Secondary | ICD-10-CM | POA: Insufficient documentation

## 2020-08-08 DIAGNOSIS — I10 Essential (primary) hypertension: Secondary | ICD-10-CM | POA: Insufficient documentation

## 2020-08-08 DIAGNOSIS — E785 Hyperlipidemia, unspecified: Secondary | ICD-10-CM | POA: Insufficient documentation

## 2020-08-08 DIAGNOSIS — Z9079 Acquired absence of other genital organ(s): Secondary | ICD-10-CM | POA: Insufficient documentation

## 2020-08-08 NOTE — Research (Signed)
Q0347, A PROSPECTIVE OBSERVATIONAL COHORT STUDY TO DEVELOP A PREDICTIVE MODEL OF TAXANE-INDUCED PERIPHERAL NEUROPATHY IN CANCER PATIENTS. CONSENT VISIT: Met with patient alone in private conference room for 45 minutes. Reviewed study consent and hippa forms with patient in their entirety. Explained the purpose of the study along with potential risks and benefits of participation.  Reviewed the study required assessments and timeline for completing these assessments. Informed patient that participation is completely voluntary and she may withdraw consent at any time. All patient's questions were answered and she agreed to participate in this study. Patient signed and dated the consent form for protocol version date 04/12/2020, [place] Active Date 05/07/2020 and hippa form dated 12/31/17. Patient agreed to being contacted by the study team for future research. Patient did not agree to the optional sample collection.   Patient did not want to wait for copies of signed/dated consent and hippa forms today and states okay to give them to her in the morning when she returns for treatment. Thanked patient for agreeing to enroll on this study today. Encouraged her to call if any questions before she returns tomorrow to start treatment.  She verbalized understanding.  Foye Spurling, BSN, RN Clinical Research Nurse 08/08/2020 3:53 PM

## 2020-08-08 NOTE — Progress Notes (Signed)
[facility] OFFICE PROGRESS NOTE  Doris Noble, MD [address] [place] [address]  DIAGNOSIS: F/u of left breast cancer   Oncology History Overview Note  Cancer Staging Malignant neoplasm of upper-inner quadrant of female breast [facility]) Staging form: Breast, AJCC 8th Edition - Clinical stage from 06/11/2020: Stage IA (cT1b, cN0, cM0, G2, ER+, PR-, HER2: Equivocal) - Signed by Alla Feeling, NP on 07/01/2020 - Pathologic stage from 07/05/2020: Stage IA (pT1c, pN0, cM0, G2, ER+, PR-, HER2-, Oncotype DX score: 33) - Signed by Truitt Merle, MD on 07/29/2020    Malignant neoplasm of upper-inner quadrant of female breast ([place])  05/28/2020 Mammogram   Diagnostic left mammogram/US  -On physical exam, I palpate discrete focal soft thickening in the 10-11 o'clock location of the LEFT breast. -Targeted ultrasound is performed, showing an irregular taller than wide hypoechoic mass with indistinct margins in the 10 o'clock location of the LEFT breast 8 centimeters from the nipple. There is associated posterior acoustic shadowing. No significant internal blood flow identified on Doppler evaluation.  IMPRESSION: Suspicious mass in the 10 o'clock location of the LEFT breast. No LEFT axillary adenopathy.     06/11/2020 Cancer Staging   Staging form: Breast, AJCC 8th Edition - Clinical stage from 06/11/2020: Stage IA (cT1b, cN0, cM0, G2, ER+, PR-, HER2: Equivocal) - Signed by Alla Feeling, NP on 07/01/2020   06/11/2020 Initial Biopsy   FINAL MICROSCOPIC DIAGNOSIS: A. BREAST, 10:00, LEFT, BIOPSY: - Invasive ductal carcinoma The carcinoma appears Nottingham grade 1-2 of 3 and measures 0.9 cm in greatest linear extent  PROGNOSTIC INDICATOR RESULTS: The tumor cells are EQUIVOCAL for Her2 (2+) Estrogen Receptor: 90%, MODERATE STAINING INTENSITY Progesterone Receptor: NEGATIVE Proliferation Marker Ki-67: 5%  FLOURESCENCE IN-SITU HYBRIDIZATION RESULTS: GROUP 5: HER2  **NEGATIVE** RATIO of HER2/CEN 17 SIGNALS: 1.18 AVERAGE HER2 COPY NUMBER PER CELL: 1.30   07/01/2020 Initial Diagnosis   Malignant neoplasm of upper-inner quadrant of female breast ([place])   07/05/2020 Surgery   LEFT BREAST LUMPECTOMY WITH RADIOACTIVE SEED AND LEFT AXILLARY SENTINEL LYMPH NODE BIOPSY by Dr Lucia Gaskins    07/05/2020 Pathology Results   FINAL MICROSCOPIC DIAGNOSIS:   A. BREAST, LEFT, LUMPECTOMY:  - Invasive ductal carcinoma, 1.8 cm.  - Margins not involved.  - Biopsy site and biopsy clip.  - See oncology table.   B. LYMPH NODE, LEFT AXILLARY, SENTINEL, EXCISION:  - One lymph node with no metastatic carcinoma (0/1).    07/05/2020 Oncotype testing   Oncotype  Recurrence Score 33 Distant recurrence risk at 9 years with Tamoxifen alone is 21% There is >15% benefit of adjuvant chemotherapy.    07/05/2020 Cancer Staging   Staging form: Breast, AJCC 8th Edition - Pathologic stage from 07/05/2020: Stage IA (pT1c, pN0, cM0, G2, ER+, PR-, HER2-, Oncotype DX score: 33) - Signed by Truitt Merle, MD on 07/29/2020   08/09/2020 -  Chemotherapy   The patient had PACLitaxel (TAXOL) 138 mg in sodium chloride 0.9 % 250 mL chemo infusion (</= 58m/m2), 80 mg/m2 = 138 mg, Intravenous,  Once, 0 of 12 cycles  for chemotherapy treatment.      CURRENT THERAPY: Weekly paclitaxel 80 mg/m2  INTERVAL HISTORY: Doris Potter89y.o. female returns to the clinic for a follow up visit. The patient is feeling well today without any concerning complaints. She did have erythema and swelling in her left axillary region on 08/06/20. This area was tender, warm, and erythematous. She then saw her surgeon on 08/06/20 and had  what sounds like an incision and drainage. The patient states she has mild drainage yesterday but that it was clear. She states that the area "feels like it is healing". It is no longer tender to palpation or warm. The erythema and swelling has improved. She is currently on a course of antibiotics with  bactrim BID. She started this on 08/06/20 and is unsure how many days she has left. Denies recent fevers. Her last reported fever is when she received her flu shot on 07/29/20. She denies chills or night sweats.   The patient is here to start the first cycle of chemotherapy. Her chemoeducation class went well and she does not have any questions. She is trying to drink ensure a few times daily. Denies any chest pain, shortness of breath, cough, or hemoptysis. Denies any nausea, vomiting, diarrhea, or constipation. Denies any headache or visual changes. She denies baseline peripheral neuropathy. Her energy and appetite is good. The patient is here today for evaluation prior to starting cycle # 1.  MEDICAL HISTORY: Past Medical History:  Diagnosis Date  . Anxiety   . Arthritis   . Cancer ([place]) 05/2020   left breast IDC  . Concussion 1985  . Depression   . Hyperlipidemia   . Hypertension   . Mild concussion 2002   tripped over bag at work  . Osteopenia 07/2011   T score -1.8 FRAX 11%/1.5%  . Panic attacks   . Seasonal allergies     ALLERGIES:  is allergic to lipitor [atorvastatin calcium], zocor [simvastatin - high dose], and benadryl [diphenhydramine].  MEDICATIONS:  Current Outpatient Medications  Medication Sig Dispense Refill  . ALPRAZolam (XANAX) 1 MG tablet Take 1 mg by mouth at bedtime as needed for sleep.    Marland Kitchen amLODipine (NORVASC) 2.5 MG tablet Take 2.5 mg by mouth daily.    . cyclobenzaprine (FLEXERIL) 10 MG tablet Take 10 mg by mouth 3 (three) times daily as needed (muscle spasms).     . fluticasone (FLONASE) 50 MCG/ACT nasal spray Place into both nostrils daily.    Marland Kitchen lidocaine-prilocaine (EMLA) cream Apply to affected area once 30 g 3  . multivitamin (THERAGRAN) per tablet Take 1 tablet by mouth daily.      . naproxen sodium (ALEVE) 220 MG tablet Take 220 mg by mouth.    . ondansetron (ZOFRAN) 8 MG tablet Take 1 tablet (8 mg total) by mouth 2 (two) times daily as needed (Nausea  or vomiting). 30 tablet 1  . pravastatin (PRAVACHOL) 10 MG tablet Take 10 mg by mouth daily.      . prochlorperazine (COMPAZINE) 10 MG tablet Take 1 tablet (10 mg total) by mouth every 6 (six) hours as needed (Nausea or vomiting). 30 tablet 1  . Pyridoxine HCl (VITAMIN B-6 PO) Take 1 tablet by mouth daily.     . sertraline (ZOLOFT) 50 MG tablet Take 50 mg by mouth daily.    Marland Kitchen sulfamethoxazole-trimethoprim (BACTRIM DS) 800-160 MG tablet Take 1 tablet by mouth 2 (two) times daily.    . traMADol (ULTRAM) 50 MG tablet Take 1 tablet (50 mg total) by mouth every 6 (six) hours as needed for moderate pain or severe pain. 12 tablet 0   No current facility-administered medications for this visit.    SURGICAL HISTORY:  Past Surgical History:  Procedure Laterality Date  . ABDOMINAL HYSTERECTOMY  april 1990   TAH BSO  . BACK SURGERY  1996   ruputured disc L-5  . BLADDER SUSPENSION  1982  .  BREAST BIOPSY Left   . BREAST LUMPECTOMY WITH RADIOACTIVE SEED AND SENTINEL LYMPH NODE BIOPSY Left 07/05/2020   Procedure: LEFT BREAST LUMPECTOMY WITH RADIOACTIVE SEED AND LEFT AXILLARY SENTINEL LYMPH NODE BIOPSY;  Surgeon: Alphonsa Overall, MD;  Location: [facility];  Service: General;  Laterality: Left;  LEFT BREAST CANCER  . BREAST SURGERY  1984   cyst-benign  . COLONOSCOPY N/A 03/30/2013   Procedure: COLONOSCOPY;  Surgeon: Rogene Houston, MD;  [facility];  Service: Endoscopy;  Laterality: N/A;  930  . DILATION AND CURETTAGE OF UTERUS  12/09/1988   abnormal uterine bleeding  . IR IMAGING GUIDED PORT INSERTION  08/05/2020    REVIEW OF SYSTEMS:   Review of Systems  Constitutional: Negative for appetite change, chills, fatigue, fever and unexpected weight change.  HENT: Negative for mouth sores, nosebleeds, sore throat and trouble swallowing.   Eyes: Negative for eye problems and icterus.  Respiratory: Negative for cough, hemoptysis, shortness of breath and wheezing.   Cardiovascular:  Negative for chest pain and leg swelling.  Gastrointestinal: Negative for abdominal pain, constipation, diarrhea, nausea and vomiting.  Genitourinary: Negative for bladder incontinence, difficulty urinating, dysuria, frequency and hematuria.   Musculoskeletal: Negative for back pain, gait problem, neck pain and neck stiffness.  Skin: Negative for itching and rash.  Neurological: Negative for dizziness, extremity weakness, gait problem, headaches, light-headedness and seizures.  Hematological: Negative for adenopathy. Does not bruise/bleed easily.  Psychiatric/Behavioral: Negative for confusion, depression and sleep disturbance. The patient is not nervous/anxious.     PHYSICAL EXAMINATION:  Blood pressure (!) 129/56, pulse 65, temperature (!) 97 F (36.1 C), resp. rate 16, height '5\' 4"'  (1.626 m), weight 134 lb 3.2 oz (60.9 kg), last menstrual period 03/05/1989, SpO2 100 %.  ECOG PERFORMANCE STATUS: 1 - Symptomatic but completely ambulatory  Physical Exam  Constitutional: Oriented to person, place, and time and well-developed, well-nourished, and in no distress.  HENT:  Head: Normocephalic and atraumatic.  Mouth/Throat: Oropharynx is clear and moist. No oropharyngeal exudate.  Eyes: Conjunctivae are normal. Right eye exhibits no discharge. Left eye exhibits no discharge. No scleral icterus.  Neck: Normal range of motion. Neck supple.  Cardiovascular: Normal rate, regular rhythm, normal heart sounds and intact distal pulses.   Pulmonary/Chest: Effort normal and breath sounds normal. No respiratory distress. No wheezes. No rales.  Abdominal: Soft. Bowel sounds are normal. Exhibits no distension and no mass. There is no tenderness.  Musculoskeletal: Normal range of motion. Exhibits no edema.  Lymphadenopathy:    No cervical adenopathy.  Neurological: Alert and oriented to person, place, and time. Exhibits normal muscle tone. Gait normal. Coordination normal.  Skin: No drainage from left  axillary incision. Swelling improved. Mild erythema. No significant tenderness to palpation. No fluctuance. Skin is warm and dry. Not diaphoretic.  No pallor.  Psychiatric: Mood, memory and judgment normal.  Vitals reviewed.  Left Axillary region 08/09/20      Left Axillary images from 08/06/20 per Physical therapy note by Maudry Diego for comparison     LABORATORY DATA: Lab Results  Component Value Date   WBC 10.3 08/09/2020   HGB 12.5 08/09/2020   HCT 37.7 08/09/2020   MCV 88.1 08/09/2020   PLT 422 (H) 08/09/2020      Chemistry      Component Value Date/Time   NA 135 08/09/2020 1015   K 4.2 08/09/2020 1015   CL 101 08/09/2020 1015   CO2 24 08/09/2020 1015   BUN 12 08/09/2020  1015   CREATININE 0.90 08/09/2020 1015      Component Value Date/Time   CALCIUM 9.7 08/09/2020 1015   ALKPHOS 60 08/09/2020 1015   AST 26 08/09/2020 1015   ALT 13 08/09/2020 1015   BILITOT 0.3 08/09/2020 1015       RADIOGRAPHIC STUDIES:  IR IMAGING GUIDED PORT INSERTION  Result Date: 08/05/2020 CLINICAL DATA:  Breast carcinoma, needs durable venous access for planned therapy regimen. EXAM: TUNNELED PORT CATHETER PLACEMENT WITH ULTRASOUND AND FLUOROSCOPIC GUIDANCE FLUOROSCOPY TIME:  0.1 minute; less than 1 mGy ANESTHESIA/SEDATION: Intravenous Fentanyl 137mg and Versed 250mwere administered as conscious sedation during continuous monitoring of the patient's level of consciousness and physiological / cardiorespiratory status by the radiology RN, with a total moderate sedation time of 14 minutes. TECHNIQUE: The procedure, risks, benefits, and alternatives were explained to the patient. Questions regarding the procedure were encouraged and answered. The patient understands and consents to the procedure. As antibiotic prophylaxis, cefazolin 2 g was ordered pre-procedure and administered intravenously within one hour of incision. Patency of the right IJ vein was confirmed with ultrasound with image  documentation. An appropriate skin site was determined. Skin site was marked. Region was prepped using maximum barrier technique including cap and mask, sterile gown, sterile gloves, large sterile sheet, and Chlorhexidine as cutaneous antisepsis. The region was infiltrated locally with 1% lidocaine. Under real-time ultrasound guidance, the right IJ vein was accessed with a 21 gauge micropuncture needle; the needle tip within the vein was confirmed with ultrasound image documentation. Needle was exchanged over a 018 guidewire for transitional dilator, and vascular measurement was performed. A small incision was made on the right anterior chest wall and a subcutaneous pocket fashioned. The power-injectable port was positioned and its catheter tunneled to the right IJ dermatotomy site. The transitional dilator was exchanged over an Amplatz wire for a peel-away sheath, through which the port catheter, which had been trimmed to the appropriate length, was advanced and positioned under fluoroscopy with its tip at the cavoatrial junction. Spot chest radiograph confirms good catheter position and no pneumothorax. The port was flushed per protocol. The pocket was closed with deep interrupted and subcuticular continuous 3-0 Monocryl sutures. The incisions were covered with Dermabond then covered with a sterile dressing. The patient tolerated the procedure well. COMPLICATIONS: COMPLICATIONS None immediate IMPRESSION: Technically successful right IJ power-injectable port catheter placement. Ready for routine use. Electronically Signed   By: D Lucrezia Europe.D.   On: 08/05/2020 16:34     ASSESSMENT/PLAN:  MeJOSSELYNE ONOFRIOs a [age].o. female with   1.Left breast cancer, grade 1-2, ER+/PR-/HER2- Ki-67 5%, pT1cN0M0, Grade 1, stage IA, RS 33,  -She was diagnosed in 06/2020 with grade 1-2 invasive ductal carcinoma of the left breast.  -She underwent left lumpectomy with SLNB with Dr NeLucia Gaskinsn 07/05/20. Dr. FeBurr Medicoreviously discussed  her pathology which showed a 1.8 cm tumor with 1 - lymph nodes.  Surgical margins were negative. -Given the early stage disease, she does not need staging scans. -Dr. FeBurr Medicoreviously also discussed her Oncotype Dx testing of surgical sample which showed high risk disease with recurrence score of 33.  The estimated distant recurrence is 21% in the next 9 years. -Dr. FeBurr Medicoreviously discussed adjuvant chemotherapy to reduce her risk of future recurrence, given the high risk disease.  She discussed the option of ddAC-T, TC and weekly Taxol.  She is [age]ear old, also functions very well at home, and has good baseline health, she has very  limited social support.  Dr. Burr Medico recommended weekly paclitaxel for 12 weeks as her adjuvant chemotherapy. --Chemotherapy consent: Side effects including but does not not limited to, fatigue, nausea, vomiting, diarrhea, hair loss, peripheral neuropathy, fluid retention, renal and kidney dysfunction, neutropenic fever, needed for blood transfusion, bleeding, were discussed with patient in great detail. She agrees to proceed.  -The goal of therapy is curative -She is enrolled in the neuropathy study -Dr. Burr Medico previously also discussed the benefit of adjuvant radiation and adjuvant antiestrogen therapy.   -Labs reviewed which are adequate to proceed with cycle #1 today as scheduled.  -F/U in 1 week   2. Left axillary abscess  -Patient underwent what sounds like an incision and drainage on 08/06/20 -Patient currently being treated with Abx with bactrim.  -Discussed with Dr. Burr Medico, we will proceed with cycle #1 today as scheduled.  -The patient was advised to call immediately or seek medical attention if she has fevers, chills, erythema, pain, swelling, and drainage from the left axillary lesion.   3. Osteopenia  -DEXA on 04/01/2017 shows osteopenia in the right femur neck with T score -1.6.Her FRAX score shows a 16.9% probability of a major osteoporotic fracture in 10 years  and 3.1% risk for hip fracture -Dr. Burr Medico previously encouraged her to begin calcium and vitamin D once daily and continue weightbearing exercise -Will repeat before starting adjuvant antiestrogen therapy   PLAN: -Discussed with Dr. Burr Medico. The patient afebrile and non-toxic appearing. Swelling, pain, tenderness, and warmth improved today in left axillary region. WBC 10.3 today.  -Labs adequate to proceed with cycle #1 today as scheduled.  -The patient was instructed to continue taking antibiotics as scheduled. Advised to seek medical attention if worsening pain, swelling, or drainage from improving left axillary lesion.      No orders of the defined types were placed in this encounter.    Marcel Gary L Orie [place], PA-C 08/09/20

## 2020-08-09 ENCOUNTER — Other Ambulatory Visit: Payer: Self-pay

## 2020-08-09 ENCOUNTER — Encounter: Payer: Self-pay | Admitting: *Deleted

## 2020-08-09 ENCOUNTER — Inpatient Hospital Stay: Payer: Medicare Other

## 2020-08-09 ENCOUNTER — Encounter: Payer: Self-pay | Admitting: Medical Oncology

## 2020-08-09 ENCOUNTER — Inpatient Hospital Stay (HOSPITAL_BASED_OUTPATIENT_CLINIC_OR_DEPARTMENT_OTHER): Payer: Medicare Other | Admitting: Medical

## 2020-08-09 ENCOUNTER — Encounter: Payer: Self-pay | Admitting: Physician Assistant

## 2020-08-09 ENCOUNTER — Inpatient Hospital Stay: Payer: Medicare Other | Admitting: Physician Assistant

## 2020-08-09 ENCOUNTER — Encounter: Payer: Self-pay | Admitting: Hematology

## 2020-08-09 VITALS — BP 143/71 | HR 73 | Temp 98.1°F | Resp 17

## 2020-08-09 VITALS — BP 129/56 | HR 65 | Temp 97.0°F | Resp 16 | Ht 64.0 in | Wt 134.2 lb

## 2020-08-09 DIAGNOSIS — I10 Essential (primary) hypertension: Secondary | ICD-10-CM | POA: Diagnosis not present

## 2020-08-09 DIAGNOSIS — T8090XA Unspecified complication following infusion and therapeutic injection, initial encounter: Secondary | ICD-10-CM

## 2020-08-09 DIAGNOSIS — F329 Major depressive disorder, single episode, unspecified: Secondary | ICD-10-CM | POA: Diagnosis not present

## 2020-08-09 DIAGNOSIS — Z90710 Acquired absence of both cervix and uterus: Secondary | ICD-10-CM | POA: Diagnosis not present

## 2020-08-09 DIAGNOSIS — G4700 Insomnia, unspecified: Secondary | ICD-10-CM | POA: Diagnosis not present

## 2020-08-09 DIAGNOSIS — C50212 Malignant neoplasm of upper-inner quadrant of left female breast: Secondary | ICD-10-CM

## 2020-08-09 DIAGNOSIS — Z79899 Other long term (current) drug therapy: Secondary | ICD-10-CM | POA: Diagnosis not present

## 2020-08-09 DIAGNOSIS — Z95828 Presence of other vascular implants and grafts: Secondary | ICD-10-CM

## 2020-08-09 DIAGNOSIS — E785 Hyperlipidemia, unspecified: Secondary | ICD-10-CM | POA: Diagnosis not present

## 2020-08-09 DIAGNOSIS — Z9079 Acquired absence of other genital organ(s): Secondary | ICD-10-CM | POA: Diagnosis not present

## 2020-08-09 DIAGNOSIS — M85851 Other specified disorders of bone density and structure, right thigh: Secondary | ICD-10-CM | POA: Diagnosis not present

## 2020-08-09 DIAGNOSIS — Z90722 Acquired absence of ovaries, bilateral: Secondary | ICD-10-CM | POA: Diagnosis not present

## 2020-08-09 DIAGNOSIS — Z170 Estrogen receptor positive status [ER+]: Secondary | ICD-10-CM

## 2020-08-09 DIAGNOSIS — Z5111 Encounter for antineoplastic chemotherapy: Secondary | ICD-10-CM | POA: Diagnosis not present

## 2020-08-09 DIAGNOSIS — M7989 Other specified soft tissue disorders: Secondary | ICD-10-CM | POA: Diagnosis not present

## 2020-08-09 DIAGNOSIS — F419 Anxiety disorder, unspecified: Secondary | ICD-10-CM | POA: Diagnosis not present

## 2020-08-09 LAB — CBC WITH DIFFERENTIAL/PLATELET
Abs Immature Granulocytes: 0.03 10*3/uL (ref 0.00–0.07)
Basophils Absolute: 0.1 10*3/uL (ref 0.0–0.1)
Basophils Relative: 1 %
Eosinophils Absolute: 0.1 10*3/uL (ref 0.0–0.5)
Eosinophils Relative: 1 %
HCT: 37.7 % (ref 36.0–46.0)
Hemoglobin: 12.5 g/dL (ref 12.0–15.0)
Immature Granulocytes: 0 %
Lymphocytes Relative: 11 %
Lymphs Abs: 1.1 10*3/uL (ref 0.7–4.0)
MCH: 29.2 pg (ref 26.0–34.0)
MCHC: 33.2 g/dL (ref 30.0–36.0)
MCV: 88.1 fL (ref 80.0–100.0)
Monocytes Absolute: 0.5 10*3/uL (ref 0.1–1.0)
Monocytes Relative: 5 %
Neutro Abs: 8.5 10*3/uL — ABNORMAL HIGH (ref 1.7–7.7)
Neutrophils Relative %: 82 %
Platelets: 422 10*3/uL — ABNORMAL HIGH (ref 150–400)
RBC: 4.28 MIL/uL (ref 3.87–5.11)
RDW: 13 % (ref 11.5–15.5)
WBC: 10.3 10*3/uL (ref 4.0–10.5)
nRBC: 0 % (ref 0.0–0.2)

## 2020-08-09 LAB — COMPREHENSIVE METABOLIC PANEL
ALT: 13 U/L (ref 0–44)
AST: 26 U/L (ref 15–41)
Albumin: 3.5 g/dL (ref 3.5–5.0)
Alkaline Phosphatase: 60 U/L (ref 38–126)
Anion gap: 10 (ref 5–15)
BUN: 12 mg/dL (ref 8–23)
CO2: 24 mmol/L (ref 22–32)
Calcium: 9.7 mg/dL (ref 8.9–10.3)
Chloride: 101 mmol/L (ref 98–111)
Creatinine, Ser: 0.9 mg/dL (ref 0.44–1.00)
GFR calc non Af Amer: >60 mL/min (ref >60)
Glucose, Bld: 103 mg/dL — ABNORMAL HIGH (ref 70–99)
Potassium: 4.2 mmol/L (ref 3.5–5.1)
Sodium: 135 mmol/L (ref 135–145)
Total Bilirubin: 0.3 mg/dL (ref 0.3–1.2)
Total Protein: 8.2 g/dL — ABNORMAL HIGH (ref 6.5–8.1)

## 2020-08-09 LAB — RESEARCH LABS

## 2020-08-09 MED ORDER — FAMOTIDINE IN NACL 20-0.9 MG/50ML-% IV SOLN
20.0000 mg | Freq: Once | INTRAVENOUS | Status: AC | PRN
  Administered 2020-08-09: 20 mg via INTRAVENOUS

## 2020-08-09 MED ORDER — METHYLPREDNISOLONE SODIUM SUCC 125 MG IJ SOLR
125.0000 mg | Freq: Once | INTRAMUSCULAR | Status: AC | PRN
  Administered 2020-08-09: 60 mg via INTRAVENOUS

## 2020-08-09 MED ORDER — SODIUM CHLORIDE 0.9 % IV SOLN
10.0000 mg | Freq: Once | INTRAVENOUS | Status: AC
  Administered 2020-08-09: 10 mg via INTRAVENOUS
  Filled 2020-08-09: qty 10

## 2020-08-09 MED ORDER — SODIUM CHLORIDE 0.9% FLUSH
10.0000 mL | INTRAVENOUS | Status: DC | PRN
  Administered 2020-08-09: 10 mL via INTRAVENOUS
  Filled 2020-08-09: qty 10

## 2020-08-09 MED ORDER — SODIUM CHLORIDE 0.9 % IV SOLN
Freq: Once | INTRAVENOUS | Status: AC
  Filled 2020-08-09: qty 250

## 2020-08-09 MED ORDER — SODIUM CHLORIDE 0.9% FLUSH
10.0000 mL | INTRAVENOUS | Status: DC | PRN
  Administered 2020-08-09: 10 mL
  Filled 2020-08-09: qty 10

## 2020-08-09 MED ORDER — LORATADINE 10 MG PO TABS
ORAL_TABLET | ORAL | Status: AC
  Filled 2020-08-09: qty 1

## 2020-08-09 MED ORDER — SODIUM CHLORIDE 0.9 % IV SOLN
80.0000 mg/m2 | Freq: Once | INTRAVENOUS | Status: AC
  Administered 2020-08-09: 138 mg via INTRAVENOUS
  Filled 2020-08-09: qty 23

## 2020-08-09 MED ORDER — FAMOTIDINE IN NACL 20-0.9 MG/50ML-% IV SOLN
INTRAVENOUS | Status: AC
  Filled 2020-08-09: qty 50

## 2020-08-09 MED ORDER — LORATADINE 10 MG PO TABS
10.0000 mg | ORAL_TABLET | Freq: Every day | ORAL | Status: DC
  Administered 2020-08-09: 10 mg via ORAL

## 2020-08-09 MED ORDER — HEPARIN SOD (PORK) LOCK FLUSH 100 UNIT/ML IV SOLN
500.0000 [IU] | Freq: Once | INTRAVENOUS | Status: AC | PRN
  Administered 2020-08-09: 500 [IU]
  Filled 2020-08-09: qty 5

## 2020-08-09 MED ORDER — FAMOTIDINE IN NACL 20-0.9 MG/50ML-% IV SOLN
20.0000 mg | Freq: Once | INTRAVENOUS | Status: AC
  Administered 2020-08-09: 20 mg via INTRAVENOUS

## 2020-08-09 NOTE — Progress Notes (Signed)
Met with patient at registration to introduce myself as Arboriculturist and to offer available resources.  Discussed one-time $1000 Radio broadcast assistant to assist with personal expenses while going through treatment. Advised what is needed to apply. Also gave her an application for [employer] which assists patients whom live in [place]. Asdvised she may bring application along with supporting documents back to me and I will email to [place].  Gave her my card if interested in applying and for any additional financial questions or concerns.

## 2020-08-09 NOTE — Progress Notes (Signed)
At approximately 1332 patient c/o dizziness/lightheadedness and difficulty breathing with mask on.  RN stopped Taxol infusion, NS 0.9% started wide open, and advised patient to remove mask - 2L O2 applied.  Sandi Mealy, PA notified.  VSS.  Additional medications administered per PA's verbal orders.  Patient stated she is feeling much better.  Taxol restarted at 1355 at original rate and volume.  No other s/s or c/o distress or discomfort.  AVS printed.  Patient educated and discharged with belongings.

## 2020-08-09 NOTE — Progress Notes (Signed)
[id number], A PROSPECTIVE OBSERVATIONAL COHORT STUDY TO DEVELOP A PREDICTIVE MODEL OF TAXANE-INDUCED PERIPHERAL NEUROPATHY IN CANCER PATIENTS. Baseline and 1st Taxane visit:   Patient presented to the clinic, alone, for her first treatment. I met with patient before her appointments and provided her the study questionnaires to complete. Patient and I reviewed her medical history as well as her concomitant medications this morning. Patient denies having any discomfort or neuropathy to either her hands or feet.  Patient confirms no history of smoking.    PROs: Questionnaires were given to patient to complete in clinic prior to registration. Collected questionnaires and checked for completeness and accuracy.  Registration: Patient meets all eligibility requirements to be enrolled on the S1714 study.  Second eligibility confirmed by research CRC, Carol Ada. Dr. Burr Medico agrees patient meets eligiblity and patient was registered to the S1714 study via Open and assigned patient ID [id number] Labs: Baseline mandatory research labs were collected, per protocol, along with other standard of care labs ordered by provider, including serum creatinine level. Physician Assessments: CTCAE and Treatment Burden forms reviewed by PA Cassie Heilingoetter and signed by Dr. Burr Medico.  History of Falls: Patient stated she had one slip and fall in July of 2021. Medical and Smoking History: Reviewed with patient and CRFs completed. Per patient, she never smoked.  Assessment for Interventions for CIPN: Reviewed with patient and CRFs completed. Neuropen Assessment: Completed per protocol by this certified research RN, time recording completed by Foye Spurling, RN. Tuning [place]ork Assessment: Completed per protocol by this Film/video editor, time recording completed by Foye Spurling, RN. Timed Get Up and Go Test: Completed per protocol by this trained research RN, with time documentation by Foye Spurling RN.  1st Taxane:  Blood draw to be  collected at the next treatment visit due to today's timing of completion of treatment.  Plan: Informed patient of next study assessments in approximately 4 weeks and will be done on same day as treatment.  Plan for this visit to be approximately four weeks time.  Patient denied having any questions at this time. Patient thanked for her time and contribution to study and was encouraged to call clinic for any questions or concerns she may have prior to her next appointment. Patient was provided today with copies of her signed consent and authorization forms.  Maxwell Marion, RN, BSN, [facility] Clinical Research 08/09/2020 3:08 PM

## 2020-08-09 NOTE — Patient Instructions (Signed)

## 2020-08-09 NOTE — Patient Instructions (Addendum)
[facility] Discharge Instructions for Patients Receiving Chemotherapy  Today you received the following chemotherapy agents: Taxol.  To help prevent nausea and vomiting after your treatment, we encourage you to take your nausea medication as directed.   If you develop nausea and vomiting that is not controlled by your nausea medication, call the clinic.   BELOW ARE SYMPTOMS THAT SHOULD BE REPORTED IMMEDIATELY:  *FEVER GREATER THAN 100.5 F  *CHILLS WITH OR WITHOUT FEVER  NAUSEA AND VOMITING THAT IS NOT CONTROLLED WITH YOUR NAUSEA MEDICATION  *UNUSUAL SHORTNESS OF BREATH  *UNUSUAL BRUISING OR BLEEDING  TENDERNESS IN MOUTH AND THROAT WITH OR WITHOUT PRESENCE OF ULCERS  *URINARY PROBLEMS  *BOWEL PROBLEMS  UNUSUAL RASH Items with * indicate a potential emergency and should be followed up as soon as possible.  Feel free to call the clinic should you have any questions or concerns. The clinic phone number is [phone].  Please show the CHEMO ALERT CARD at check-in to the Emergency Department and triage nurse.  Paclitaxel injection What is this medicine? PACLITAXEL (PAK li TAX el) is a chemotherapy drug. It targets fast dividing cells, like cancer cells, and causes these cells to die. This medicine is used to treat ovarian cancer, breast cancer, lung cancer, Kaposi's sarcoma, and other cancers. This medicine may be used for other purposes; ask your health care provider or pharmacist if you have questions. COMMON BRAND NAME(S): Onxol, Taxol What should I tell my health care provider before I take this medicine? They need to know if you have any of these conditions:  history of irregular heartbeat  liver disease  low blood counts, like low white cell, platelet, or red cell counts  lung or breathing disease, like asthma  tingling of the fingers or toes, or other nerve disorder  an unusual or allergic reaction to paclitaxel, alcohol, polyoxyethylated castor  oil, other chemotherapy, other medicines, foods, dyes, or preservatives  pregnant or trying to get pregnant  breast-feeding How should I use this medicine? This drug is given as an infusion into a vein. It is administered in a hospital or clinic by a specially trained health care professional. Talk to your pediatrician regarding the use of this medicine in children. Special care may be needed. Overdosage: If you think you have taken too much of this medicine contact a poison control center or emergency room at once. NOTE: This medicine is only for you. Do not share this medicine with others. What if I miss a dose? It is important not to miss your dose. Call your doctor or health care professional if you are unable to keep an appointment. What may interact with this medicine? Do not take this medicine with any of the following medications:  disulfiram  metronidazole This medicine may also interact with the following medications:  antiviral medicines for hepatitis, HIV or AIDS  certain antibiotics like erythromycin and clarithromycin  certain medicines for fungal infections like ketoconazole and itraconazole  certain medicines for seizures like carbamazepine, phenobarbital, phenytoin  gemfibrozil  nefazodone  rifampin  St. John's wort This list may not describe all possible interactions. Give your health care provider a list of all the medicines, herbs, non-prescription drugs, or dietary supplements you use. Also tell them if you smoke, drink alcohol, or use illegal drugs. Some items may interact with your medicine. What should I watch for while using this medicine? Your condition will be monitored carefully while you are receiving this medicine. You will need important blood work   done while you are taking this medicine. This medicine can cause serious allergic reactions. To reduce your risk you will need to take other medicine(s) before treatment with this medicine. If you  experience allergic reactions like skin rash, itching or hives, swelling of the face, lips, or tongue, tell your doctor or health care professional right away. In some cases, you may be given additional medicines to help with side effects. Follow all directions for their use. This drug may make you feel generally unwell. This is not uncommon, as chemotherapy can affect healthy cells as well as cancer cells. Report any side effects. Continue your course of treatment even though you feel ill unless your doctor tells you to stop. Call your doctor or health care professional for advice if you get a fever, chills or sore throat, or other symptoms of a cold or flu. Do not treat yourself. This drug decreases your body's ability to fight infections. Try to avoid being around people who are sick. This medicine may increase your risk to bruise or bleed. Call your doctor or health care professional if you notice any unusual bleeding. Be careful brushing and flossing your teeth or using a toothpick because you may get an infection or bleed more easily. If you have any dental work done, tell your dentist you are receiving this medicine. Avoid taking products that contain aspirin, acetaminophen, ibuprofen, naproxen, or ketoprofen unless instructed by your doctor. These medicines may hide a fever. Do not become pregnant while taking this medicine. Women should inform their doctor if they wish to become pregnant or think they might be pregnant. There is a potential for serious side effects to an unborn child. Talk to your health care professional or pharmacist for more information. Do not breast-feed an infant while taking this medicine. Men are advised not to father a child while receiving this medicine. This product may contain alcohol. Ask your pharmacist or healthcare provider if this medicine contains alcohol. Be sure to tell all healthcare providers you are taking this medicine. Certain medicines, like metronidazole  and disulfiram, can cause an unpleasant reaction when taken with alcohol. The reaction includes flushing, headache, nausea, vomiting, sweating, and increased thirst. The reaction can last from 30 minutes to several hours. What side effects may I notice from receiving this medicine? Side effects that you should report to your doctor or health care professional as soon as possible:  allergic reactions like skin rash, itching or hives, swelling of the face, lips, or tongue  breathing problems  changes in vision  fast, irregular heartbeat  high or low blood pressure  mouth sores  pain, tingling, numbness in the hands or feet  signs of decreased platelets or bleeding - bruising, pinpoint red spots on the skin, black, tarry stools, blood in the urine  signs of decreased red blood cells - unusually weak or tired, feeling faint or lightheaded, falls  signs of infection - fever or chills, cough, sore throat, pain or difficulty passing urine  signs and symptoms of liver injury like dark yellow or brown urine; general ill feeling or flu-like symptoms; light-colored stools; loss of appetite; nausea; right upper belly pain; unusually weak or tired; yellowing of the eyes or skin  swelling of the ankles, feet, hands  unusually slow heartbeat Side effects that usually do not require medical attention (report to your doctor or health care professional if they continue or are bothersome):  diarrhea  hair loss  loss of appetite  muscle or joint pain    nausea, vomiting  pain, redness, or irritation at site where injected  tiredness This list may not describe all possible side effects. Call your doctor for medical advice about side effects. You may report side effects to FDA at 1-800-FDA-1088. Where should I keep my medicine? This drug is given in a hospital or clinic and will not be stored at home. NOTE: This sheet is a summary. It may not cover all possible information. If you have  questions about this medicine, talk to your doctor, pharmacist, or health care provider.  2020 Elsevier/Gold Standard (2017-06-22 13:14:55)  

## 2020-08-12 ENCOUNTER — Telehealth: Payer: Self-pay | Admitting: *Deleted

## 2020-08-12 NOTE — Progress Notes (Signed)
°  °  DATE:  08/09/2020                                           X  CHEMO/IMMUNOTHERAPY REACTION            MD:  Dr. Truitt Merle   AGENT/BLOOD PRODUCT RECEIVING TODAY:              Taxol (1st time, 5 minutes into infusion)   AGENT/BLOOD PRODUCT RECEIVING IMMEDIATELY PRIOR TO REACTION:          Taxol (1st time, 5 minutes into infusion)   VS: BP:     127/52   P:       89       SPO2:       96% on room air T: 98.1                  REACTION(S):           Light-headedness, dizziness, shortness of breath   PREMEDS:     Claritan, Decadron and Pepcid   INTERVENTION: Taxol was paused and the patient was given Pepcid 20 mg IV and solumedrol 60 mg IV   Review of Systems  Review of Systems  Constitutional: Negative for chills, diaphoresis and fever.  HENT: Negative for trouble swallowing and voice change.   Respiratory: Positive for shortness of breath. Negative for cough, chest tightness and wheezing.   Cardiovascular: Negative for chest pain and palpitations.  Gastrointestinal: Negative for abdominal pain, constipation, diarrhea, nausea and vomiting.  Musculoskeletal: Negative for back pain and myalgias.  Neurological: Positive for dizziness and light-headedness. Negative for headaches.     Physical Exam  Physical Exam Constitutional:      General: She is not in acute distress.    Appearance: She is not diaphoretic.  HENT:     Head: Normocephalic and atraumatic.  Cardiovascular:     Rate and Rhythm: Normal rate and regular rhythm.     Heart sounds: Normal heart sounds. No murmur heard.  No friction rub. No gallop.   Pulmonary:     Effort: Pulmonary effort is normal. No respiratory distress.     Breath sounds: Normal breath sounds. No wheezing or rales.  Skin:    General: Skin is warm and dry.     Findings: No erythema or rash.  Neurological:     Mental Status: She is alert.     OUTCOME:                The patient's symptoms abated after the above intervention. She was able to  restart and complete her Taxol infusion without any additional issues of concern   Sandi Mealy, MHS, PA-C

## 2020-08-13 NOTE — Progress Notes (Signed)
[name]   [phone] [phone]   Clinic Follow up Note   Patient Care Team: Asencion Noble, MD as PCP - General (Internal Medicine) Rockwell Germany, RN as Oncology Nurse Navigator Mauro Kaufmann, RN as Oncology Nurse Navigator Alphonsa Overall, MD as Consulting Physician (General Surgery) Kyung Rudd, MD as Consulting Physician (Radiation Oncology) Truitt Merle, MD as Consulting Physician (Hematology) Alla Feeling, NP as Nurse Practitioner (Nurse Practitioner)  Date of Service:  08/15/2020  CHIEF COMPLAINT: F/u of left breast cancer   SUMMARY OF ONCOLOGIC HISTORY: Oncology History Overview Note  Cancer Staging Malignant neoplasm of upper-inner quadrant of female breast [facility]) Staging form: Breast, AJCC 8th Edition - Clinical stage from 06/11/2020: Stage IA (cT1b, cN0, cM0, G2, ER+, PR-, HER2: Equivocal) - Signed by Alla Feeling, NP on 07/01/2020 - Pathologic stage from 07/05/2020: Stage IA (pT1c, pN0, cM0, G2, ER+, PR-, HER2-, Oncotype DX score: 33) - Signed by Truitt Merle, MD on 07/29/2020    Malignant neoplasm of upper-inner quadrant of female breast ([place])  05/28/2020 Mammogram   Diagnostic left mammogram/US  -On physical exam, I palpate discrete focal soft thickening in the 10-11 o'clock location of the LEFT breast. -Targeted ultrasound is performed, showing an irregular taller than wide hypoechoic mass with indistinct margins in the 10 o'clock location of the LEFT breast 8 centimeters from the nipple. There is associated posterior acoustic shadowing. No significant internal blood flow identified on Doppler evaluation.  IMPRESSION: Suspicious mass in the 10 o'clock location of the LEFT breast. No LEFT axillary adenopathy.     06/11/2020 Cancer Staging   Staging form: Breast, AJCC 8th Edition - Clinical stage from 06/11/2020: Stage IA (cT1b, cN0, cM0, G2, ER+, PR-, HER2: Equivocal) - Signed by Alla Feeling, NP on 07/01/2020   06/11/2020  Initial Biopsy   FINAL MICROSCOPIC DIAGNOSIS: A. BREAST, 10:00, LEFT, BIOPSY: - Invasive ductal carcinoma The carcinoma appears Nottingham grade 1-2 of 3 and measures 0.9 cm in greatest linear extent  PROGNOSTIC INDICATOR RESULTS: The tumor cells are EQUIVOCAL for Her2 (2+) Estrogen Receptor: 90%, MODERATE STAINING INTENSITY Progesterone Receptor: NEGATIVE Proliferation Marker Ki-67: 5%  FLOURESCENCE IN-SITU HYBRIDIZATION RESULTS: GROUP 5: HER2 **NEGATIVE** RATIO of HER2/CEN 17 SIGNALS: 1.18 AVERAGE HER2 COPY NUMBER PER CELL: 1.30   07/01/2020 Initial Diagnosis   Malignant neoplasm of upper-inner quadrant of female breast ([place])   07/05/2020 Surgery   LEFT BREAST LUMPECTOMY WITH RADIOACTIVE SEED AND LEFT AXILLARY SENTINEL LYMPH NODE BIOPSY by Dr Lucia Gaskins    07/05/2020 Pathology Results   FINAL MICROSCOPIC DIAGNOSIS:   A. BREAST, LEFT, LUMPECTOMY:  - Invasive ductal carcinoma, 1.8 cm.  - Margins not involved.  - Biopsy site and biopsy clip.  - See oncology table.   B. LYMPH NODE, LEFT AXILLARY, SENTINEL, EXCISION:  - One lymph node with no metastatic carcinoma (0/1).    07/05/2020 Oncotype testing   Oncotype  Recurrence Score 33 Distant recurrence risk at 9 years with Tamoxifen alone is 21% There is >15% benefit of adjuvant chemotherapy.    07/05/2020 Cancer Staging   Staging form: Breast, AJCC 8th Edition - Pathologic stage from 07/05/2020: Stage IA (pT1c, pN0, cM0, G2, ER+, PR-, HER2-, Oncotype DX score: 33) - Signed by Truitt Merle, MD on 07/29/2020   08/09/2020 -  Chemotherapy   Weekly Taxol for 12 weeks starting 08/09/20       CURRENT THERAPY:  Weekly Taxol for 12 weeks starting 08/09/20   INTERVAL HISTORY:  Doris Ellis  is here for a follow up. She presents to the clinic with her son.  She had some dizziness and back pain during first Taxol infusion last week, infusion was held and she received steroids and pepside.  Taxol infusion was restarted and she tolerated the rest of  ambulation very well.  No significant skin rash or other allergy reaction at home.  She had mild low appetite, no nausea, vomiting, diarrhea or other new symptoms.  No neuropathy, weight is stable.  Very system otherwise negative  MEDICAL HISTORY:  Past Medical History:  Diagnosis Date  . Anxiety   . Arthritis   . Cancer ([place]) 05/2020   left breast IDC  . Concussion 1985  . Depression   . Hyperlipidemia   . Hypertension   . Mild concussion 2002   tripped over bag at work  . Osteopenia 07/2011   T score -1.8 FRAX 11%/1.5%  . Panic attacks   . Seasonal allergies     SURGICAL HISTORY: Past Surgical History:  Procedure Laterality Date  . ABDOMINAL HYSTERECTOMY  april 1990   TAH BSO  . BACK SURGERY  1996   ruputured disc L-5  . BLADDER SUSPENSION  1982  . BREAST BIOPSY Left   . BREAST LUMPECTOMY WITH RADIOACTIVE SEED AND SENTINEL LYMPH NODE BIOPSY Left 07/05/2020   Procedure: LEFT BREAST LUMPECTOMY WITH RADIOACTIVE SEED AND LEFT AXILLARY SENTINEL LYMPH NODE BIOPSY;  Surgeon: Alphonsa Overall, MD;  Location: [facility];  Service: General;  Laterality: Left;  LEFT BREAST CANCER  . BREAST SURGERY  1984   cyst-benign  . COLONOSCOPY N/A 03/30/2013   Procedure: COLONOSCOPY;  Surgeon: Rogene Houston, MD;  [facility];  Service: Endoscopy;  Laterality: N/A;  930  . DILATION AND CURETTAGE OF UTERUS  12/09/1988   abnormal uterine bleeding  . IR IMAGING GUIDED PORT INSERTION  08/05/2020    I have reviewed the social history and family history with the patient and they are unchanged from previous note.  ALLERGIES:  is allergic to lipitor [atorvastatin calcium], zocor [simvastatin - high dose], and benadryl [diphenhydramine].  MEDICATIONS:  Current Outpatient Medications  Medication Sig Dispense Refill  . ALPRAZolam (XANAX) 1 MG tablet Take 1 mg by mouth at bedtime as needed for sleep.    Marland Kitchen amLODipine (NORVASC) 2.5 MG tablet Take 2.5 mg by mouth daily.    .  cyclobenzaprine (FLEXERIL) 10 MG tablet Take 1 tablet (10 mg total) by mouth 3 (three) times daily as needed (muscle spasms). 30 tablet 0  . fluticasone (FLONASE) 50 MCG/ACT nasal spray Place into both nostrils daily.    Marland Kitchen lidocaine-prilocaine (EMLA) cream Apply to affected area once 30 g 3  . multivitamin (THERAGRAN) per tablet Take 1 tablet by mouth daily.      . naproxen sodium (ALEVE) 220 MG tablet Take 220 mg by mouth.    . ondansetron (ZOFRAN) 8 MG tablet Take 1 tablet (8 mg total) by mouth 2 (two) times daily as needed (Nausea or vomiting). 30 tablet 1  . pravastatin (PRAVACHOL) 10 MG tablet Take 10 mg by mouth daily.      . prochlorperazine (COMPAZINE) 10 MG tablet Take 1 tablet (10 mg total) by mouth every 6 (six) hours as needed (Nausea or vomiting). 30 tablet 1  . Pyridoxine HCl (VITAMIN B-6 PO) Take 1 tablet by mouth daily.     . sertraline (ZOLOFT) 50 MG tablet Take 50 mg by mouth daily.    Marland Kitchen sulfamethoxazole-trimethoprim (BACTRIM DS)  800-160 MG tablet Take 1 tablet by mouth 2 (two) times daily.    . traMADol (ULTRAM) 50 MG tablet Take 1 tablet (50 mg total) by mouth every 6 (six) hours as needed for moderate pain or severe pain. 12 tablet 0   No current facility-administered medications for this visit.   Facility-Administered Medications Ordered in Other Visits  Medication Dose Route Frequency Provider Last Rate Last Admin  . famotidine (PEPCID) 40 mg in sodium chloride 0.9 % 100 mL IVPB  40 mg Intravenous Once Truitt Merle, MD 416 mL/hr at 08/15/20 1055 40 mg at 08/15/20 1055  . heparin lock flush 100 unit/mL  500 Units Intracatheter Once PRN Truitt Merle, MD      . loratadine (CLARITIN) tablet 10 mg  10 mg Oral Daily Truitt Merle, MD   10 mg at 08/15/20 1048  . PACLitaxel (TAXOL) 138 mg in sodium chloride 0.9 % 250 mL chemo infusion (</= 27m/m2)  80 mg/m2 (Treatment Plan Recorded) Intravenous Once FTruitt Merle MD      . sodium chloride flush (NS) 0.9 % injection 10 mL  10 mL Intracatheter  PRN FTruitt Merle MD        PHYSICAL EXAMINATION: ECOG PERFORMANCE STATUS: 0 - Asymptomatic  Vitals:   08/15/20 0930  BP: (!) 144/78  Pulse: 88  Resp: 18  Temp: (!) 97 F (36.1 C)  SpO2: 100%   Filed Weights   08/15/20 0930  Weight: 132 lb 8 oz (60.1 kg)    GENERAL:alert, no distress and comfortable SKIN: skin color, texture, turgor are normal, no rashes or significant lesions EYES: normal, Conjunctiva are pink and non-injected, sclera clear NECK: supple, thyroid normal size, non-tender, without nodularity LYMPH:  no palpable lymphadenopathy in the cervical, axillary  LUNGS: clear to auscultation and percussion with normal breathing effort HEART: regular rate & rhythm and no murmurs and no lower extremity edema ABDOMEN:abdomen soft, non-tender and normal bowel sounds Musculoskeletal:no cyanosis of digits and no clubbing  NEURO: alert & oriented x 3 with fluent speech, no focal motor/sensory deficits  LABORATORY DATA:  I have reviewed the data as listed CBC Latest Ref Rng & Units 08/15/2020 08/09/2020 08/05/2020  WBC 4.0 - 10.5 K/uL 5.1 10.3 8.2  Hemoglobin 12.0 - 15.0 g/dL 12.5 12.5 13.0  Hematocrit 36 - 46 % 37.3 37.7 39.2  Platelets 150 - 400 K/uL 368 422(H) 430(H)     CMP Latest Ref Rng & Units 08/15/2020 08/09/2020 08/05/2020  Glucose 70 - 99 mg/dL 119(H) 103(H) 113(H)  BUN 8 - 23 mg/dL '17 12 14  ' Creatinine 0.44 - 1.00 mg/dL 0.82 0.90 0.72  Sodium 135 - 145 mmol/L 135 135 136  Potassium 3.5 - 5.1 mmol/L 4.0 4.2 4.1  Chloride 98 - 111 mmol/L 103 101 99  CO2 22 - 32 mmol/L '26 24 23  ' Calcium 8.9 - 10.3 mg/dL 9.8 9.7 9.6  Total Protein 6.5 - 8.1 g/dL 8.0 8.2(H) -  Total Bilirubin 0.3 - 1.2 mg/dL 0.4 0.3 -  Alkaline Phos 38 - 126 U/L 46 60 -  AST 15 - 41 U/L 30 26 -  ALT 0 - 44 U/L 12 13 -      RADIOGRAPHIC STUDIES: I have personally reviewed the radiological images as listed and agreed with the findings in the report. No results found.   ASSESSMENT & PLAN:    MTRANIKA SCHOLLERis a 79y.o. 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  -She underwent left lumpectomy with SLNB with Dr Lucia Gaskins on 07/05/20. Her surgical pathology showed a 1.8 cm tumor with 1 negative lymph node.  Surgical margins were negative. Her Oncotype Dx testing showed high risk disease with recurrence score of 33.  -Given high risk disease I recommended adjuvant chemo to reduce her risk of recurrence. She started weekly paclitaxel for 12 weeks on 08/09/20. -She had a mild reaction during the 1st Taxol infusion, but was able to finish treatment -Plan to change premedication dexamethasone to Solu-Medrol 125 mg, and increase famotidine to 40 mg before today's Taxol, and will do Taxol infusion as first treatment protocol  -f/u next week    2. Osteopenia  -DEXA on 04/01/2017 shows osteopenia in the right femur neck with T score -1.6.Her FRAX score shows a 16.9% probability of a major osteoporotic fracture in 10 years and 3.1% risk for hip fracture -We encouraged her to begin calcium and vitamin D once daily and continue weightbearing exercise -Will repeat DEXA before starting adjuvant antiestrogen therapy  3. Left axillary abscess  -Patient underwent what sounds like an incision and drainage on 08/06/20 and treated with Abx with bactrim.  -much improved, near resolved now    PLAN: -Lab reviewed, adequate to proceed with 2nd dose paclitaxel today, with premedication Claritin, Solu-Medrol, and high-dose famotidine -We will do slow paclitaxel infusion today as first-line treatment protocol -I communicated the above with infusion nurse and pharmacy -Follow-up next week before 3rd cycle, then f/u every 2 weeks    No problem-specific Assessment & Plan notes found for this encounter.   No orders of the defined types were placed in  this encounter.  All questions were answered. The patient knows to call the clinic with any problems, questions or concerns. No barriers to learning was detected. The total time spent in the appointment was 30 minutes.     Truitt Merle, MD 08/15/2020   I, Joslyn Devon, am acting as scribe for Truitt Merle, MD.   I have reviewed the above documentation for accuracy and completeness, and I agree with the above.

## 2020-08-14 ENCOUNTER — Other Ambulatory Visit: Payer: Self-pay | Admitting: Medical Oncology

## 2020-08-14 DIAGNOSIS — C50212 Malignant neoplasm of upper-inner quadrant of left female breast: Secondary | ICD-10-CM

## 2020-08-14 DIAGNOSIS — Z170 Estrogen receptor positive status [ER+]: Secondary | ICD-10-CM

## 2020-08-14 MED FILL — Dexamethasone Sodium Phosphate Inj 100 MG/10ML: INTRAMUSCULAR | Qty: 1 | Status: AC

## 2020-08-15 ENCOUNTER — Inpatient Hospital Stay: Payer: Medicare Other

## 2020-08-15 ENCOUNTER — Inpatient Hospital Stay: Payer: Medicare Other | Admitting: Hematology

## 2020-08-15 ENCOUNTER — Other Ambulatory Visit: Payer: Self-pay

## 2020-08-15 ENCOUNTER — Encounter: Payer: Self-pay | Admitting: Hematology

## 2020-08-15 ENCOUNTER — Encounter: Payer: Self-pay | Admitting: *Deleted

## 2020-08-15 ENCOUNTER — Encounter: Payer: Self-pay | Admitting: Medical Oncology

## 2020-08-15 ENCOUNTER — Telehealth: Payer: Self-pay | Admitting: Hematology

## 2020-08-15 VITALS — BP 148/67 | HR 74 | Temp 98.4°F | Resp 18

## 2020-08-15 VITALS — BP 144/78 | HR 88 | Temp 97.0°F | Resp 18 | Ht 64.0 in | Wt 132.5 lb

## 2020-08-15 DIAGNOSIS — C50212 Malignant neoplasm of upper-inner quadrant of left female breast: Secondary | ICD-10-CM | POA: Diagnosis not present

## 2020-08-15 DIAGNOSIS — E785 Hyperlipidemia, unspecified: Secondary | ICD-10-CM | POA: Diagnosis not present

## 2020-08-15 DIAGNOSIS — I10 Essential (primary) hypertension: Secondary | ICD-10-CM | POA: Diagnosis not present

## 2020-08-15 DIAGNOSIS — G4700 Insomnia, unspecified: Secondary | ICD-10-CM | POA: Diagnosis not present

## 2020-08-15 DIAGNOSIS — Z170 Estrogen receptor positive status [ER+]: Secondary | ICD-10-CM

## 2020-08-15 DIAGNOSIS — M85851 Other specified disorders of bone density and structure, right thigh: Secondary | ICD-10-CM | POA: Diagnosis not present

## 2020-08-15 DIAGNOSIS — Z79899 Other long term (current) drug therapy: Secondary | ICD-10-CM | POA: Diagnosis not present

## 2020-08-15 DIAGNOSIS — Z95828 Presence of other vascular implants and grafts: Secondary | ICD-10-CM | POA: Insufficient documentation

## 2020-08-15 DIAGNOSIS — Z5111 Encounter for antineoplastic chemotherapy: Secondary | ICD-10-CM | POA: Diagnosis not present

## 2020-08-15 LAB — CBC WITH DIFFERENTIAL/PLATELET
Abs Immature Granulocytes: 0.01 10*3/uL (ref 0.00–0.07)
Basophils Absolute: 0.1 10*3/uL (ref 0.0–0.1)
Basophils Relative: 1 %
Eosinophils Absolute: 0.1 10*3/uL (ref 0.0–0.5)
Eosinophils Relative: 2 %
HCT: 37.3 % (ref 36.0–46.0)
Hemoglobin: 12.5 g/dL (ref 12.0–15.0)
Immature Granulocytes: 0 %
Lymphocytes Relative: 22 %
Lymphs Abs: 1.1 10*3/uL (ref 0.7–4.0)
MCH: 30.1 pg (ref 26.0–34.0)
MCHC: 33.5 g/dL (ref 30.0–36.0)
MCV: 89.9 fL (ref 80.0–100.0)
Monocytes Absolute: 0.3 10*3/uL (ref 0.1–1.0)
Monocytes Relative: 5 %
Neutro Abs: 3.6 10*3/uL (ref 1.7–7.7)
Neutrophils Relative %: 70 %
Platelets: 368 10*3/uL (ref 150–400)
RBC: 4.15 MIL/uL (ref 3.87–5.11)
RDW: 12.6 % (ref 11.5–15.5)
WBC: 5.1 10*3/uL (ref 4.0–10.5)
nRBC: 0 % (ref 0.0–0.2)

## 2020-08-15 LAB — COMPREHENSIVE METABOLIC PANEL
ALT: 12 U/L (ref 0–44)
AST: 30 U/L (ref 15–41)
Albumin: 3.7 g/dL (ref 3.5–5.0)
Alkaline Phosphatase: 46 U/L (ref 38–126)
Anion gap: 6 (ref 5–15)
BUN: 17 mg/dL (ref 8–23)
CO2: 26 mmol/L (ref 22–32)
Calcium: 9.8 mg/dL (ref 8.9–10.3)
Chloride: 103 mmol/L (ref 98–111)
Creatinine, Ser: 0.82 mg/dL (ref 0.44–1.00)
GFR, Estimated: >60 mL/min (ref >60)
Glucose, Bld: 119 mg/dL — ABNORMAL HIGH (ref 70–99)
Potassium: 4 mmol/L (ref 3.5–5.1)
Sodium: 135 mmol/L (ref 135–145)
Total Bilirubin: 0.4 mg/dL (ref 0.3–1.2)
Total Protein: 8 g/dL (ref 6.5–8.1)

## 2020-08-15 LAB — RESEARCH LABS

## 2020-08-15 MED ORDER — SODIUM CHLORIDE 0.9 % IV SOLN
80.0000 mg/m2 | Freq: Once | INTRAVENOUS | Status: AC
  Administered 2020-08-15: 138 mg via INTRAVENOUS
  Filled 2020-08-15: qty 23

## 2020-08-15 MED ORDER — CYCLOBENZAPRINE HCL 10 MG PO TABS
10.0000 mg | ORAL_TABLET | Freq: Three times a day (TID) | ORAL | 0 refills | Status: DC | PRN
Start: 2020-08-15 — End: 2020-09-30

## 2020-08-15 MED ORDER — SODIUM CHLORIDE 0.9 % IV SOLN
Freq: Once | INTRAVENOUS | Status: AC
  Filled 2020-08-15: qty 250

## 2020-08-15 MED ORDER — LORATADINE 10 MG PO TABS
ORAL_TABLET | ORAL | Status: AC
  Filled 2020-08-15: qty 1

## 2020-08-15 MED ORDER — SODIUM CHLORIDE 0.9 % IV SOLN
40.0000 mg | Freq: Once | INTRAVENOUS | Status: AC
  Administered 2020-08-15: 40 mg via INTRAVENOUS
  Filled 2020-08-15: qty 4

## 2020-08-15 MED ORDER — METHYLPREDNISOLONE SODIUM SUCC 125 MG IJ SOLR
125.0000 mg | Freq: Every day | INTRAMUSCULAR | Status: AC
  Administered 2020-08-15: 125 mg via INTRAVENOUS

## 2020-08-15 MED ORDER — FAMOTIDINE IN NACL 20-0.9 MG/50ML-% IV SOLN
INTRAVENOUS | Status: AC
  Filled 2020-08-15: qty 50

## 2020-08-15 MED ORDER — SODIUM CHLORIDE 0.9% FLUSH
10.0000 mL | INTRAVENOUS | Status: DC | PRN
  Administered 2020-08-15: 10 mL
  Filled 2020-08-15: qty 10

## 2020-08-15 MED ORDER — SODIUM CHLORIDE 0.9% FLUSH
10.0000 mL | Freq: Once | INTRAVENOUS | Status: AC
  Administered 2020-08-15: 10 mL
  Filled 2020-08-15: qty 10

## 2020-08-15 MED ORDER — HEPARIN SOD (PORK) LOCK FLUSH 100 UNIT/ML IV SOLN
500.0000 [IU] | Freq: Once | INTRAVENOUS | Status: AC | PRN
  Administered 2020-08-15: 500 [IU]
  Filled 2020-08-15: qty 5

## 2020-08-15 MED ORDER — METHYLPREDNISOLONE SODIUM SUCC 125 MG IJ SOLR
INTRAMUSCULAR | Status: AC
  Filled 2020-08-15: qty 2

## 2020-08-15 MED ORDER — LORATADINE 10 MG PO TABS
10.0000 mg | ORAL_TABLET | Freq: Every day | ORAL | Status: DC
  Administered 2020-08-15: 10 mg via ORAL

## 2020-08-15 NOTE — Telephone Encounter (Signed)
Scheduled per 10/14 los. Printed appt calendar for pt.

## 2020-08-15 NOTE — Patient Instructions (Signed)
[facility] Discharge Instructions for Patients Receiving Chemotherapy  Today you received the following chemotherapy agents Paclitaxel (TAXOL).  To help prevent nausea and vomiting after your treatment, we encourage you to take your nausea medication as prescribed.  If you develop nausea and vomiting that is not controlled by your nausea medication, call the clinic.   BELOW ARE SYMPTOMS THAT SHOULD BE REPORTED IMMEDIATELY:  *FEVER GREATER THAN 100.5 F  *CHILLS WITH OR WITHOUT FEVER  NAUSEA AND VOMITING THAT IS NOT CONTROLLED WITH YOUR NAUSEA MEDICATION  *UNUSUAL SHORTNESS OF BREATH  *UNUSUAL BRUISING OR BLEEDING  TENDERNESS IN MOUTH AND THROAT WITH OR WITHOUT PRESENCE OF ULCERS  *URINARY PROBLEMS  *BOWEL PROBLEMS  UNUSUAL RASH Items with * indicate a potential emergency and should be followed up as soon as possible.  Feel free to call the clinic should you have any questions or concerns. The clinic phone number is [phone].  Please show the CHEMO ALERT CARD at check-in to the Emergency Department and triage nurse.   

## 2020-08-15 NOTE — Research (Signed)
[id number]: Taxane Blood Collection Study required blood collected at 13:26 by phlebotomist Tresa Endo. Taxane end time @ 13:27  Maxwell Marion, RN, BSN, [facility] Clinical Research 08/15/2020 4:43 PM

## 2020-08-19 NOTE — Progress Notes (Signed)
[place]   [phone] [phone]   Clinic Follow up Note   Patient Care Team: Asencion Noble, MD as PCP - General (Internal Medicine) Rockwell Germany, RN as Oncology Nurse Navigator Mauro Kaufmann, RN as Oncology Nurse Navigator Alphonsa Overall, MD as Consulting Physician (General Surgery) Kyung Rudd, MD as Consulting Physician (Radiation Oncology) Truitt Merle, MD as Consulting Physician (Hematology) Alla Feeling, NP as Nurse Practitioner (Nurse Practitioner)  Date of Service:  08/22/2020  CHIEF COMPLAINT: F/u of left breast cancer  SUMMARY OF ONCOLOGIC HISTORY: Oncology History Overview Note  Cancer Staging Malignant neoplasm of upper-inner quadrant of female breast [facility]) Staging form: Breast, AJCC 8th Edition - Clinical stage from 06/11/2020: Stage IA (cT1b, cN0, cM0, G2, ER+, PR-, HER2: Equivocal) - Signed by Alla Feeling, NP on 07/01/2020 - Pathologic stage from 07/05/2020: Stage IA (pT1c, pN0, cM0, G2, ER+, PR-, HER2-, Oncotype DX score: 33) - Signed by Truitt Merle, MD on 07/29/2020    Malignant neoplasm of upper-inner quadrant of female breast ([place])  05/28/2020 Mammogram   Diagnostic left mammogram/US  -On physical exam, I palpate discrete focal soft thickening in the 10-11 o'clock location of the LEFT breast. -Targeted ultrasound is performed, showing an irregular taller than wide hypoechoic mass with indistinct margins in the 10 o'clock location of the LEFT breast 8 centimeters from the nipple. There is associated posterior acoustic shadowing. No significant internal blood flow identified on Doppler evaluation.  IMPRESSION: Suspicious mass in the 10 o'clock location of the LEFT breast. No LEFT axillary adenopathy.     06/11/2020 Cancer Staging   Staging form: Breast, AJCC 8th Edition - Clinical stage from 06/11/2020: Stage IA (cT1b, cN0, cM0, G2, ER+, PR-, HER2: Equivocal) - Signed by Alla Feeling, NP on 07/01/2020   06/11/2020  Initial Biopsy   FINAL MICROSCOPIC DIAGNOSIS: A. BREAST, 10:00, LEFT, BIOPSY: - Invasive ductal carcinoma The carcinoma appears Nottingham grade 1-2 of 3 and measures 0.9 cm in greatest linear extent  PROGNOSTIC INDICATOR RESULTS: The tumor cells are EQUIVOCAL for Her2 (2+) Estrogen Receptor: 90%, MODERATE STAINING INTENSITY Progesterone Receptor: NEGATIVE Proliferation Marker Ki-67: 5%  FLOURESCENCE IN-SITU HYBRIDIZATION RESULTS: GROUP 5: HER2 **NEGATIVE** RATIO of HER2/CEN 17 SIGNALS: 1.18 AVERAGE HER2 COPY NUMBER PER CELL: 1.30   07/01/2020 Initial Diagnosis   Malignant neoplasm of upper-inner quadrant of female breast ([place])   07/05/2020 Surgery   LEFT BREAST LUMPECTOMY WITH RADIOACTIVE SEED AND LEFT AXILLARY SENTINEL LYMPH NODE BIOPSY by Dr Lucia Gaskins    07/05/2020 Pathology Results   FINAL MICROSCOPIC DIAGNOSIS:   A. BREAST, LEFT, LUMPECTOMY:  - Invasive ductal carcinoma, 1.8 cm.  - Margins not involved.  - Biopsy site and biopsy clip.  - See oncology table.   B. LYMPH NODE, LEFT AXILLARY, SENTINEL, EXCISION:  - One lymph node with no metastatic carcinoma (0/1).    07/05/2020 Oncotype testing   Oncotype  Recurrence Score 33 Distant recurrence risk at 9 years with Tamoxifen alone is 21% There is >15% benefit of adjuvant chemotherapy.    07/05/2020 Cancer Staging   Staging form: Breast, AJCC 8th Edition - Pathologic stage from 07/05/2020: Stage IA (pT1c, pN0, cM0, G2, ER+, PR-, HER2-, Oncotype DX score: 33) - Signed by Truitt Merle, MD on 07/29/2020   08/09/2020 -  Chemotherapy   Weekly Taxol for 12 weeks starting 08/09/20       CURRENT THERAPY:  Weekly Taxol for 12 weeks starting 08/09/20   INTERVAL HISTORY:  [name] is  here for a follow up and treatment. She presents to the clinic alone.  She tolerated chemo well last week, had insomnia but resolved after one day No other concerns, no N/V/D, no neuropahty, she has been use ice bag during chemo  Eating well, weight  stable  All other systems were reviewed with the patient and are negative.  MEDICAL HISTORY:  Past Medical History:  Diagnosis Date  . Anxiety   . Arthritis   . Cancer ([place]) 05/2020   left breast IDC  . Concussion 1985  . Depression   . Hyperlipidemia   . Hypertension   . Mild concussion 2002   tripped over bag at work  . Osteopenia 07/2011   T score -1.8 FRAX 11%/1.5%  . Panic attacks   . Seasonal allergies     SURGICAL HISTORY: Past Surgical History:  Procedure Laterality Date  . ABDOMINAL HYSTERECTOMY  april 1990   TAH BSO  . BACK SURGERY  1996   ruputured disc L-5  . BLADDER SUSPENSION  1982  . BREAST BIOPSY Left   . BREAST LUMPECTOMY WITH RADIOACTIVE SEED AND SENTINEL LYMPH NODE BIOPSY Left 07/05/2020   Procedure: LEFT BREAST LUMPECTOMY WITH RADIOACTIVE SEED AND LEFT AXILLARY SENTINEL LYMPH NODE BIOPSY;  Surgeon: Alphonsa Overall, MD;  Location: [facility];  Service: General;  Laterality: Left;  LEFT BREAST CANCER  . BREAST SURGERY  1984   cyst-benign  . COLONOSCOPY N/A 03/30/2013   Procedure: COLONOSCOPY;  Surgeon: Rogene Houston, MD;  [facility];  Service: Endoscopy;  Laterality: N/A;  930  . DILATION AND CURETTAGE OF UTERUS  12/09/1988   abnormal uterine bleeding  . IR IMAGING GUIDED PORT INSERTION  08/05/2020    I have reviewed the social history and family history with the patient and they are unchanged from previous note.  ALLERGIES:  is allergic to lipitor [atorvastatin calcium], zocor [simvastatin - high dose], and benadryl [diphenhydramine].  MEDICATIONS:  Current Outpatient Medications  Medication Sig Dispense Refill  . ALPRAZolam (XANAX) 1 MG tablet Take 1 mg by mouth at bedtime as needed for sleep.    Marland Kitchen amLODipine (NORVASC) 2.5 MG tablet Take 2.5 mg by mouth daily.    . cyclobenzaprine (FLEXERIL) 10 MG tablet Take 1 tablet (10 mg total) by mouth 3 (three) times daily as needed (muscle spasms). 30 tablet 0  . fluticasone  (FLONASE) 50 MCG/ACT nasal spray Place into both nostrils daily.    Marland Kitchen lidocaine-prilocaine (EMLA) cream Apply to affected area once 30 g 3  . multivitamin (THERAGRAN) per tablet Take 1 tablet by mouth daily.      . naproxen sodium (ALEVE) 220 MG tablet Take 220 mg by mouth.    . ondansetron (ZOFRAN) 8 MG tablet Take 1 tablet (8 mg total) by mouth 2 (two) times daily as needed (Nausea or vomiting). 30 tablet 1  . pravastatin (PRAVACHOL) 10 MG tablet Take 10 mg by mouth daily.      . prochlorperazine (COMPAZINE) 10 MG tablet Take 1 tablet (10 mg total) by mouth every 6 (six) hours as needed (Nausea or vomiting). 30 tablet 1  . Pyridoxine HCl (VITAMIN B-6 PO) Take 1 tablet by mouth daily.     . sertraline (ZOLOFT) 50 MG tablet Take 50 mg by mouth daily.    Marland Kitchen sulfamethoxazole-trimethoprim (BACTRIM DS) 800-160 MG tablet Take 1 tablet by mouth 2 (two) times daily.    . traMADol (ULTRAM) 50 MG tablet Take 1 tablet (50 mg total) by  mouth every 6 (six) hours as needed for moderate pain or severe pain. 12 tablet 0   No current facility-administered medications for this visit.    PHYSICAL EXAMINATION: ECOG PERFORMANCE STATUS: 0 - Asymptomatic  Vitals:   08/22/20 1006  BP: 136/61  Pulse: 67  Resp: 17  Temp: (!) 97 F (36.1 C)  SpO2: 100%   Filed Weights   08/22/20 1006  Weight: 133 lb 9.6 oz (60.6 kg)    GENERAL:alert, no distress and comfortable SKIN: skin color, texture, turgor are normal, no rashes or significant lesions EYES: normal, Conjunctiva are pink and non-injected, sclera clear NECK: supple, thyroid normal size, non-tender, without nodularity LYMPH:  no palpable lymphadenopathy in the cervical, axillary, right [place] soft tissue fullness, no palpable node, port site clean, no neck edema  LUNGS: clear to auscultation and percussion with normal breathing effort HEART: regular rate & rhythm and no murmurs and no lower extremity edema ABDOMEN:abdomen soft, non-tender and normal bowel  sounds Musculoskeletal:no cyanosis of digits and no clubbing  NEURO: alert & oriented x 3 with fluent speech, no focal motor/sensory deficits Breasts: Breast inspection showed them to be symmetrical with no nipple discharge. Incision in left breast and axilla healed well. Palpation of the breasts and axilla revealed no obvious mass that I could appreciate.   LABORATORY DATA:  I have reviewed the data as listed CBC Latest Ref Rng & Units 08/22/2020 08/15/2020 08/09/2020  WBC 4.0 - 10.5 K/uL 3.5(L) 5.1 10.3  Hemoglobin 12.0 - 15.0 g/dL 11.2(L) 12.5 12.5  Hematocrit 36 - 46 % 33.8(L) 37.3 37.7  Platelets 150 - 400 K/uL 411(H) 368 422(H)     CMP Latest Ref Rng & Units 08/22/2020 08/15/2020 08/09/2020  Glucose 70 - 99 mg/dL 103(H) 119(H) 103(H)  BUN 8 - 23 mg/dL _0 Creatinine 0.44 - 1.00 mg/dL 0.72 0.82 0.90  Sodium 135 - 145 mmol/L 137 135 135  Potassium 3.5 - 5.1 mmol/L 4.2 4.0 4.2  Chloride 98 - 111 mmol/L 106 103 101  CO2 22 - 32 mmol/L _1 Calcium 8.9 - 10.3 mg/dL 9.5 9.8 9.7  Total Protein 6.5 - 8.1 g/dL 7.1 8.0 8.2(H)  Total Bilirubin 0.3 - 1.2 mg/dL <0.2(L) 0.4 0.3  Alkaline Phos 38 - 126 U/L 40 46 60  AST 15 - 41 U/L _2 ALT 0 - 44 U/L _3 RADIOGRAPHIC STUDIES: I have personally reviewed the radiological images as listed and agreed with the findings in the report. No results found.   ASSESSMENT & PLAN:  Doris Ellis is a 79 y.o. female with    1.Left breastcancer, grade 1-2, ER+/PR-/HER2- Ki-67 5%,pT1cN0M0, Grade 1,stage IA, RS 33, -She was diagnosed in 06/2020 with grade 1-2 invasive ductal carcinoma of the left breast.  -She underwent left lumpectomy with SLNB with Dr Lucia Gaskins on 07/05/20. Her surgical pathology showeda 1.8 cm tumor with 1 negative lymph node. Surgical margins were negative. Her Oncotype Dx testing showed high risk disease with recurrence score of 33.  -Given high risk disease I recommended adjuvant chemo to reduce her  risk of recurrence. She started weekly paclitaxel for 12 weeks on 08/09/20. Given mild reaction with first dose I changed premedication dexa to Solu-Medrol 125 mg, and increase famotidine to 40 mg  -she is tolerating chemo well, will reduce premedication Solu-Medrol to 80 mgdue to insomnia -Labs reviewed, adequate for treatment, will proceed week 3 Taxol today -Follow-up in  2 weeks  2.Osteopenia -DEXA on 04/01/2017 shows osteopenia in the right femur neck with T score -1.6.Her FRAX score shows a 16.9% probability of a major osteoporotic fracture in 10 years and 3.1% risk for hip fracture -We encouraged her to begin calcium and vitamin D once daily and continue weightbearing exercise -Will repeat DEXA after chemo   3. Left axillary abscess  -Patient underwent what sounds like an incision and drainage on 08/06/20 and treated with Abx with bactrim.  -resolved now    PLAN: -Lab reviewed, adequate to proceed with 3rd dose paclitaxel today, with premedication Claritin, Solu-Medrol (decrease to 77m), and high-dose famotidine -regular taxol infusion protocol today  -f/u in 2 weeks   No problem-specific Assessment & Plan notes found for this encounter.   No orders of the defined types were placed in this encounter.  All questions were answered. The patient knows to call the clinic with any problems, questions or concerns. No barriers to learning was detected. The total time spent in the appointment was 30 minutes.     YTruitt Merle MD 08/22/2020   I, AJoslyn Devon am acting as scribe for YTruitt Merle MD.   I have reviewed the above documentation for accuracy and completeness, and I agree with the above.

## 2020-08-22 ENCOUNTER — Inpatient Hospital Stay: Payer: Medicare Other | Admitting: Hematology

## 2020-08-22 ENCOUNTER — Encounter: Payer: Self-pay | Admitting: Hematology

## 2020-08-22 ENCOUNTER — Other Ambulatory Visit: Payer: Self-pay

## 2020-08-22 ENCOUNTER — Inpatient Hospital Stay: Payer: Medicare Other

## 2020-08-22 VITALS — BP 136/61 | HR 67 | Temp 97.0°F | Resp 17 | Ht 64.0 in | Wt 133.6 lb

## 2020-08-22 DIAGNOSIS — Z79899 Other long term (current) drug therapy: Secondary | ICD-10-CM | POA: Diagnosis not present

## 2020-08-22 DIAGNOSIS — Z170 Estrogen receptor positive status [ER+]: Secondary | ICD-10-CM | POA: Diagnosis not present

## 2020-08-22 DIAGNOSIS — M85851 Other specified disorders of bone density and structure, right thigh: Secondary | ICD-10-CM | POA: Diagnosis not present

## 2020-08-22 DIAGNOSIS — I10 Essential (primary) hypertension: Secondary | ICD-10-CM | POA: Diagnosis not present

## 2020-08-22 DIAGNOSIS — Z5111 Encounter for antineoplastic chemotherapy: Secondary | ICD-10-CM | POA: Diagnosis not present

## 2020-08-22 DIAGNOSIS — G4700 Insomnia, unspecified: Secondary | ICD-10-CM | POA: Diagnosis not present

## 2020-08-22 DIAGNOSIS — C50212 Malignant neoplasm of upper-inner quadrant of left female breast: Secondary | ICD-10-CM

## 2020-08-22 DIAGNOSIS — Z95828 Presence of other vascular implants and grafts: Secondary | ICD-10-CM

## 2020-08-22 DIAGNOSIS — E785 Hyperlipidemia, unspecified: Secondary | ICD-10-CM | POA: Diagnosis not present

## 2020-08-22 LAB — CBC WITH DIFFERENTIAL/PLATELET
Abs Immature Granulocytes: 0.02 10*3/uL (ref 0.00–0.07)
Basophils Absolute: 0.1 10*3/uL (ref 0.0–0.1)
Basophils Relative: 1 %
Eosinophils Absolute: 0.1 10*3/uL (ref 0.0–0.5)
Eosinophils Relative: 4 %
HCT: 33.8 % — ABNORMAL LOW (ref 36.0–46.0)
Hemoglobin: 11.2 g/dL — ABNORMAL LOW (ref 12.0–15.0)
Immature Granulocytes: 1 %
Lymphocytes Relative: 32 %
Lymphs Abs: 1.1 10*3/uL (ref 0.7–4.0)
MCH: 30.2 pg (ref 26.0–34.0)
MCHC: 33.1 g/dL (ref 30.0–36.0)
MCV: 91.1 fL (ref 80.0–100.0)
Monocytes Absolute: 0.3 10*3/uL (ref 0.1–1.0)
Monocytes Relative: 8 %
Neutro Abs: 1.9 10*3/uL (ref 1.7–7.7)
Neutrophils Relative %: 54 %
Platelets: 411 10*3/uL — ABNORMAL HIGH (ref 150–400)
RBC: 3.71 MIL/uL — ABNORMAL LOW (ref 3.87–5.11)
RDW: 13.1 % (ref 11.5–15.5)
WBC: 3.5 10*3/uL — ABNORMAL LOW (ref 4.0–10.5)
nRBC: 0 % (ref 0.0–0.2)

## 2020-08-22 LAB — COMPREHENSIVE METABOLIC PANEL
ALT: 14 U/L (ref 0–44)
AST: 29 U/L (ref 15–41)
Albumin: 3.5 g/dL (ref 3.5–5.0)
Alkaline Phosphatase: 40 U/L (ref 38–126)
Anion gap: 6 (ref 5–15)
BUN: 11 mg/dL (ref 8–23)
CO2: 25 mmol/L (ref 22–32)
Calcium: 9.5 mg/dL (ref 8.9–10.3)
Chloride: 106 mmol/L (ref 98–111)
Creatinine, Ser: 0.72 mg/dL (ref 0.44–1.00)
GFR, Estimated: >60 mL/min (ref >60)
Glucose, Bld: 103 mg/dL — ABNORMAL HIGH (ref 70–99)
Potassium: 4.2 mmol/L (ref 3.5–5.1)
Sodium: 137 mmol/L (ref 135–145)
Total Bilirubin: <0.2 mg/dL — ABNORMAL LOW (ref 0.3–1.2)
Total Protein: 7.1 g/dL (ref 6.5–8.1)

## 2020-08-22 MED ORDER — SODIUM CHLORIDE 0.9% FLUSH
10.0000 mL | INTRAVENOUS | Status: DC | PRN
  Administered 2020-08-22: 10 mL
  Filled 2020-08-22: qty 10

## 2020-08-22 MED ORDER — HEPARIN SOD (PORK) LOCK FLUSH 100 UNIT/ML IV SOLN
500.0000 [IU] | Freq: Once | INTRAVENOUS | Status: AC | PRN
  Administered 2020-08-22: 500 [IU]
  Filled 2020-08-22: qty 5

## 2020-08-22 MED ORDER — LORATADINE 10 MG PO TABS
10.0000 mg | ORAL_TABLET | Freq: Every day | ORAL | Status: DC
  Administered 2020-08-22: 10 mg via ORAL

## 2020-08-22 MED ORDER — METHYLPREDNISOLONE SODIUM SUCC 125 MG IJ SOLR
80.0000 mg | Freq: Once | INTRAMUSCULAR | Status: AC
  Administered 2020-08-22: 80 mg via INTRAVENOUS

## 2020-08-22 MED ORDER — SODIUM CHLORIDE 0.9 % IV SOLN
Freq: Once | INTRAVENOUS | Status: AC
  Filled 2020-08-22: qty 250

## 2020-08-22 MED ORDER — SODIUM CHLORIDE 0.9 % IV SOLN
40.0000 mg | Freq: Once | INTRAVENOUS | Status: AC
  Administered 2020-08-22: 40 mg via INTRAVENOUS
  Filled 2020-08-22: qty 4

## 2020-08-22 MED ORDER — SODIUM CHLORIDE 0.9% FLUSH
10.0000 mL | Freq: Once | INTRAVENOUS | Status: AC
  Administered 2020-08-22: 10 mL
  Filled 2020-08-22: qty 10

## 2020-08-22 MED ORDER — LORATADINE 10 MG PO TABS
ORAL_TABLET | ORAL | Status: AC
  Filled 2020-08-22: qty 1

## 2020-08-22 MED ORDER — SODIUM CHLORIDE 0.9 % IV SOLN
80.0000 mg/m2 | Freq: Once | INTRAVENOUS | Status: AC
  Administered 2020-08-22: 138 mg via INTRAVENOUS
  Filled 2020-08-22: qty 23

## 2020-08-22 MED ORDER — METHYLPREDNISOLONE SODIUM SUCC 125 MG IJ SOLR
INTRAMUSCULAR | Status: AC
  Filled 2020-08-22: qty 2

## 2020-08-22 NOTE — Patient Instructions (Signed)
[facility] Discharge Instructions for Patients Receiving Chemotherapy  Today you received the following chemotherapy agents Paclitaxel (TAXOL).  To help prevent nausea and vomiting after your treatment, we encourage you to take your nausea medication as prescribed.  If you develop nausea and vomiting that is not controlled by your nausea medication, call the clinic.   BELOW ARE SYMPTOMS THAT SHOULD BE REPORTED IMMEDIATELY:  *FEVER GREATER THAN 100.5 F  *CHILLS WITH OR WITHOUT FEVER  NAUSEA AND VOMITING THAT IS NOT CONTROLLED WITH YOUR NAUSEA MEDICATION  *UNUSUAL SHORTNESS OF BREATH  *UNUSUAL BRUISING OR BLEEDING  TENDERNESS IN MOUTH AND THROAT WITH OR WITHOUT PRESENCE OF ULCERS  *URINARY PROBLEMS  *BOWEL PROBLEMS  UNUSUAL RASH Items with * indicate a potential emergency and should be followed up as soon as possible.  Feel free to call the clinic should you have any questions or concerns. The clinic phone number is [phone].  Please show the CHEMO ALERT CARD at check-in to the Emergency Department and triage nurse.   

## 2020-08-30 ENCOUNTER — Inpatient Hospital Stay: Payer: Medicare Other

## 2020-08-30 ENCOUNTER — Other Ambulatory Visit: Payer: Self-pay

## 2020-08-30 VITALS — BP 131/64 | HR 63 | Temp 97.9°F | Resp 16 | Wt 135.0 lb

## 2020-08-30 DIAGNOSIS — G4700 Insomnia, unspecified: Secondary | ICD-10-CM | POA: Diagnosis not present

## 2020-08-30 DIAGNOSIS — Z95828 Presence of other vascular implants and grafts: Secondary | ICD-10-CM

## 2020-08-30 DIAGNOSIS — Z170 Estrogen receptor positive status [ER+]: Secondary | ICD-10-CM

## 2020-08-30 DIAGNOSIS — C50212 Malignant neoplasm of upper-inner quadrant of left female breast: Secondary | ICD-10-CM

## 2020-08-30 DIAGNOSIS — Z5111 Encounter for antineoplastic chemotherapy: Secondary | ICD-10-CM | POA: Diagnosis not present

## 2020-08-30 DIAGNOSIS — E785 Hyperlipidemia, unspecified: Secondary | ICD-10-CM | POA: Diagnosis not present

## 2020-08-30 DIAGNOSIS — M85851 Other specified disorders of bone density and structure, right thigh: Secondary | ICD-10-CM | POA: Diagnosis not present

## 2020-08-30 DIAGNOSIS — Z79899 Other long term (current) drug therapy: Secondary | ICD-10-CM | POA: Diagnosis not present

## 2020-08-30 DIAGNOSIS — I10 Essential (primary) hypertension: Secondary | ICD-10-CM | POA: Diagnosis not present

## 2020-08-30 LAB — CBC WITH DIFFERENTIAL/PLATELET
Abs Immature Granulocytes: 0.03 10*3/uL (ref 0.00–0.07)
Basophils Absolute: 0.1 10*3/uL (ref 0.0–0.1)
Basophils Relative: 2 %
Eosinophils Absolute: 0.1 10*3/uL (ref 0.0–0.5)
Eosinophils Relative: 3 %
HCT: 35.4 % — ABNORMAL LOW (ref 36.0–46.0)
Hemoglobin: 11.7 g/dL — ABNORMAL LOW (ref 12.0–15.0)
Immature Granulocytes: 1 %
Lymphocytes Relative: 29 %
Lymphs Abs: 1.4 10*3/uL (ref 0.7–4.0)
MCH: 30.2 pg (ref 26.0–34.0)
MCHC: 33.1 g/dL (ref 30.0–36.0)
MCV: 91.2 fL (ref 80.0–100.0)
Monocytes Absolute: 0.5 10*3/uL (ref 0.1–1.0)
Monocytes Relative: 10 %
Neutro Abs: 2.7 10*3/uL (ref 1.7–7.7)
Neutrophils Relative %: 55 %
Platelets: 306 10*3/uL (ref 150–400)
RBC: 3.88 MIL/uL (ref 3.87–5.11)
RDW: 14 % (ref 11.5–15.5)
WBC: 4.7 10*3/uL (ref 4.0–10.5)
nRBC: 0 % (ref 0.0–0.2)

## 2020-08-30 LAB — COMPREHENSIVE METABOLIC PANEL
ALT: 19 U/L (ref 0–44)
AST: 34 U/L (ref 15–41)
Albumin: 3.6 g/dL (ref 3.5–5.0)
Alkaline Phosphatase: 36 U/L — ABNORMAL LOW (ref 38–126)
Anion gap: 8 (ref 5–15)
BUN: 13 mg/dL (ref 8–23)
CO2: 22 mmol/L (ref 22–32)
Calcium: 9.4 mg/dL (ref 8.9–10.3)
Chloride: 108 mmol/L (ref 98–111)
Creatinine, Ser: 0.71 mg/dL (ref 0.44–1.00)
GFR, Estimated: >60 mL/min (ref >60)
Glucose, Bld: 107 mg/dL — ABNORMAL HIGH (ref 70–99)
Potassium: 3.8 mmol/L (ref 3.5–5.1)
Sodium: 138 mmol/L (ref 135–145)
Total Bilirubin: 0.3 mg/dL (ref 0.3–1.2)
Total Protein: 7.3 g/dL (ref 6.5–8.1)

## 2020-08-30 MED ORDER — SODIUM CHLORIDE 0.9% FLUSH
10.0000 mL | Freq: Once | INTRAVENOUS | Status: AC
  Administered 2020-08-30: 10 mL
  Filled 2020-08-30: qty 10

## 2020-08-30 MED ORDER — SODIUM CHLORIDE 0.9% FLUSH
10.0000 mL | INTRAVENOUS | Status: DC | PRN
  Administered 2020-08-30: 10 mL
  Filled 2020-08-30: qty 10

## 2020-08-30 MED ORDER — METHYLPREDNISOLONE SODIUM SUCC 125 MG IJ SOLR
INTRAMUSCULAR | Status: AC
  Filled 2020-08-30: qty 2

## 2020-08-30 MED ORDER — METHYLPREDNISOLONE SODIUM SUCC 125 MG IJ SOLR
80.0000 mg | Freq: Every day | INTRAMUSCULAR | Status: AC
  Administered 2020-08-30: 80 mg via INTRAVENOUS

## 2020-08-30 MED ORDER — HEPARIN SOD (PORK) LOCK FLUSH 100 UNIT/ML IV SOLN
500.0000 [IU] | Freq: Once | INTRAVENOUS | Status: AC | PRN
  Administered 2020-08-30: 500 [IU]
  Filled 2020-08-30: qty 5

## 2020-08-30 MED ORDER — LORATADINE 10 MG PO TABS
ORAL_TABLET | ORAL | Status: AC
  Filled 2020-08-30: qty 1

## 2020-08-30 MED ORDER — SODIUM CHLORIDE 0.9 % IV SOLN
40.0000 mg | Freq: Once | INTRAVENOUS | Status: AC
  Administered 2020-08-30: 40 mg via INTRAVENOUS
  Filled 2020-08-30: qty 4

## 2020-08-30 MED ORDER — LORATADINE 10 MG PO TABS
10.0000 mg | ORAL_TABLET | Freq: Every day | ORAL | Status: DC
  Administered 2020-08-30: 10 mg via ORAL

## 2020-08-30 MED ORDER — SODIUM CHLORIDE 0.9 % IV SOLN
Freq: Once | INTRAVENOUS | Status: AC
  Filled 2020-08-30: qty 250

## 2020-08-30 MED ORDER — SODIUM CHLORIDE 0.9 % IV SOLN
80.0000 mg/m2 | Freq: Once | INTRAVENOUS | Status: AC
  Administered 2020-08-30: 138 mg via INTRAVENOUS
  Filled 2020-08-30: qty 23

## 2020-08-30 NOTE — Patient Instructions (Signed)

## 2020-08-30 NOTE — Patient Instructions (Signed)
[facility] Discharge Instructions for Patients Receiving Chemotherapy  Today you received the following chemotherapy agents Paclitaxel (TAXOL).  To help prevent nausea and vomiting after your treatment, we encourage you to take your nausea medication as prescribed.  If you develop nausea and vomiting that is not controlled by your nausea medication, call the clinic.   BELOW ARE SYMPTOMS THAT SHOULD BE REPORTED IMMEDIATELY:  *FEVER GREATER THAN 100.5 F  *CHILLS WITH OR WITHOUT FEVER  NAUSEA AND VOMITING THAT IS NOT CONTROLLED WITH YOUR NAUSEA MEDICATION  *UNUSUAL SHORTNESS OF BREATH  *UNUSUAL BRUISING OR BLEEDING  TENDERNESS IN MOUTH AND THROAT WITH OR WITHOUT PRESENCE OF ULCERS  *URINARY PROBLEMS  *BOWEL PROBLEMS  UNUSUAL RASH Items with * indicate a potential emergency and should be followed up as soon as possible.  Feel free to call the clinic should you have any questions or concerns. The clinic phone number is [phone].  Please show the CHEMO ALERT CARD at check-in to the Emergency Department and triage nurse.   

## 2020-09-04 NOTE — Progress Notes (Signed)
Doris Ellis   [phone] [phone]   Clinic Follow up Note   Patient Care Team: Asencion Noble, MD as PCP - General (Internal Medicine) Rockwell Germany, RN as Oncology Nurse Navigator Mauro Kaufmann, RN as Oncology Nurse Navigator Alphonsa Overall, MD as Consulting Physician (General Surgery) Kyung Rudd, MD as Consulting Physician (Radiation Oncology) Truitt Merle, MD as Consulting Physician (Hematology) Alla Feeling, NP as Nurse Practitioner (Nurse Practitioner)  Date of Service:  09/06/2020  CHIEF COMPLAINT: F/u of left breast cancer  SUMMARY OF ONCOLOGIC HISTORY: Oncology History Overview Note  Cancer Staging Malignant neoplasm of upper-inner quadrant of female breast [facility]) Staging form: Breast, AJCC 8th Edition - Clinical stage from 06/11/2020: Stage IA (cT1b, cN0, cM0, G2, ER+, PR-, HER2: Equivocal) - Signed by Alla Feeling, NP on 07/01/2020 - Pathologic stage from 07/05/2020: Stage IA (pT1c, pN0, cM0, G2, ER+, PR-, HER2-, Oncotype DX score: 33) - Signed by Truitt Merle, MD on 07/29/2020    Malignant neoplasm of upper-inner quadrant of female breast ([place])  05/28/2020 Mammogram   Diagnostic left mammogram/US  -On physical exam, I palpate discrete focal soft thickening in the 10-11 o'clock location of the LEFT breast. -Targeted ultrasound is performed, showing an irregular taller than wide hypoechoic mass with indistinct margins in the 10 o'clock location of the LEFT breast 8 centimeters from the nipple. There is associated posterior acoustic shadowing. No significant internal blood flow identified on Doppler evaluation.  IMPRESSION: Suspicious mass in the 10 o'clock location of the LEFT breast. No LEFT axillary adenopathy.     06/11/2020 Cancer Staging   Staging form: Breast, AJCC 8th Edition - Clinical stage from 06/11/2020: Stage IA (cT1b, cN0, cM0, G2, ER+, PR-, HER2: Equivocal) - Signed by Alla Feeling, NP on 07/01/2020   06/11/2020  Initial Biopsy   FINAL MICROSCOPIC DIAGNOSIS: A. BREAST, 10:00, LEFT, BIOPSY: - Invasive ductal carcinoma The carcinoma appears Nottingham grade 1-2 of 3 and measures 0.9 cm in greatest linear extent  PROGNOSTIC INDICATOR RESULTS: The tumor cells are EQUIVOCAL for Her2 (2+) Estrogen Receptor: 90%, MODERATE STAINING INTENSITY Progesterone Receptor: NEGATIVE Proliferation Marker Ki-67: 5%  FLOURESCENCE IN-SITU HYBRIDIZATION RESULTS: GROUP 5: HER2 **NEGATIVE** RATIO of HER2/CEN 17 SIGNALS: 1.18 AVERAGE HER2 COPY NUMBER PER CELL: 1.30   07/01/2020 Initial Diagnosis   Malignant neoplasm of upper-inner quadrant of female breast ([place])   07/05/2020 Surgery   LEFT BREAST LUMPECTOMY WITH RADIOACTIVE SEED AND LEFT AXILLARY SENTINEL LYMPH NODE BIOPSY by Dr Lucia Gaskins    07/05/2020 Pathology Results   FINAL MICROSCOPIC DIAGNOSIS:   A. BREAST, LEFT, LUMPECTOMY:  - Invasive ductal carcinoma, 1.8 cm.  - Margins not involved.  - Biopsy site and biopsy clip.  - See oncology table.   B. LYMPH NODE, LEFT AXILLARY, SENTINEL, EXCISION:  - One lymph node with no metastatic carcinoma (0/1).    07/05/2020 Oncotype testing   Oncotype  Recurrence Score 33 Distant recurrence risk at 9 years with Tamoxifen alone is 21% There is >15% benefit of adjuvant chemotherapy.    07/05/2020 Cancer Staging   Staging form: Breast, AJCC 8th Edition - Pathologic stage from 07/05/2020: Stage IA (pT1c, pN0, cM0, G2, ER+, PR-, HER2-, Oncotype DX score: 33) - Signed by Truitt Merle, MD on 07/29/2020   08/09/2020 -  Chemotherapy   Weekly Taxol for 12 weeks starting 08/09/20       CURRENT THERAPY:  Weekly Taxol for 12 weeks starting 08/09/20  INTERVAL HISTORY:  Doris Ellis is here  for a follow up and treatment. She presents to the clinic alone. She notes she is doing well. She notes she had more hair loss. She notes she tries to walk at least 1 mile daily and cleaning her home. She denies any other infusion reaction lately. She  denies neuropathy and continues to do ice bath. She denies nausea.     REVIEW OF SYSTEMS:   Constitutional: Denies fevers, chills or abnormal weight loss Eyes: Denies blurriness of vision Ears, nose, mouth, throat, and face: Denies mucositis or sore throat Respiratory: Denies cough, dyspnea or wheezes Cardiovascular: Denies palpitation, chest discomfort or lower extremity swelling Gastrointestinal:  Denies nausea, heartburn or change in bowel habits Skin: Denies abnormal skin rashes (+) Hair loss  Lymphatics: Denies new lymphadenopathy or easy bruising Neurological:Denies numbness, tingling or new weaknesses Behavioral/Psych: Mood is stable, no new changes  All other systems were reviewed with the patient and are negative.  MEDICAL HISTORY:  Past Medical History:  Diagnosis Date  . Anxiety   . Arthritis   . Cancer ([place]) 05/2020   left breast IDC  . Concussion 1985  . Depression   . Hyperlipidemia   . Hypertension   . Mild concussion 2002   tripped over bag at work  . Osteopenia 07/2011   T score -1.8 FRAX 11%/1.5%  . Panic attacks   . Seasonal allergies     SURGICAL HISTORY: Past Surgical History:  Procedure Laterality Date  . ABDOMINAL HYSTERECTOMY  april 1990   TAH BSO  . BACK SURGERY  1996   ruputured disc L-5  . BLADDER SUSPENSION  1982  . BREAST BIOPSY Left   . BREAST LUMPECTOMY WITH RADIOACTIVE SEED AND SENTINEL LYMPH NODE BIOPSY Left 07/05/2020   Procedure: LEFT BREAST LUMPECTOMY WITH RADIOACTIVE SEED AND LEFT AXILLARY SENTINEL LYMPH NODE BIOPSY;  Surgeon: Alphonsa Overall, MD;  Location: [facility];  Service: General;  Laterality: Left;  LEFT BREAST CANCER  . BREAST SURGERY  1984   cyst-benign  . COLONOSCOPY N/A 03/30/2013   Procedure: COLONOSCOPY;  Surgeon: Rogene Houston, MD;  [facility];  Service: Endoscopy;  Laterality: N/A;  930  . DILATION AND CURETTAGE OF UTERUS  12/09/1988   abnormal uterine bleeding  . IR IMAGING GUIDED PORT  INSERTION  08/05/2020    I have reviewed the social history and family history with the patient and they are unchanged from previous note.  ALLERGIES:  is allergic to lipitor [atorvastatin calcium], zocor [simvastatin - high dose], and benadryl [diphenhydramine].  MEDICATIONS:  Current Outpatient Medications  Medication Sig Dispense Refill  . ALPRAZolam (XANAX) 1 MG tablet Take 1 mg by mouth at bedtime as needed for sleep.    Marland Kitchen amLODipine (NORVASC) 2.5 MG tablet Take 2.5 mg by mouth daily.    . cyclobenzaprine (FLEXERIL) 10 MG tablet Take 1 tablet (10 mg total) by mouth 3 (three) times daily as needed (muscle spasms). 30 tablet 0  . fluticasone (FLONASE) 50 MCG/ACT nasal spray Place into both nostrils daily.    Marland Kitchen lidocaine-prilocaine (EMLA) cream Apply to affected area once 30 g 3  . multivitamin (THERAGRAN) per tablet Take 1 tablet by mouth daily.      . naproxen sodium (ALEVE) 220 MG tablet Take 220 mg by mouth.    . ondansetron (ZOFRAN) 8 MG tablet Take 1 tablet (8 mg total) by mouth 2 (two) times daily as needed (Nausea or vomiting). 30 tablet 1  . pravastatin (PRAVACHOL) 10 MG tablet Take 10  mg by mouth daily.      . prochlorperazine (COMPAZINE) 10 MG tablet Take 1 tablet (10 mg total) by mouth every 6 (six) hours as needed (Nausea or vomiting). 30 tablet 1  . Pyridoxine HCl (VITAMIN B-6 PO) Take 1 tablet by mouth daily.     . sertraline (ZOLOFT) 50 MG tablet Take 50 mg by mouth daily.    Marland Kitchen sulfamethoxazole-trimethoprim (BACTRIM DS) 800-160 MG tablet Take 1 tablet by mouth 2 (two) times daily.    . traMADol (ULTRAM) 50 MG tablet Take 1 tablet (50 mg total) by mouth every 6 (six) hours as needed for moderate pain or severe pain. 12 tablet 0   No current facility-administered medications for this visit.    PHYSICAL EXAMINATION: ECOG PERFORMANCE STATUS: 1 - Symptomatic but completely ambulatory  Vitals:   09/06/20 0859  BP: 134/64  Pulse: 76  Resp: 18  Temp: 97.8 F (36.6 C)    SpO2: 98%   Filed Weights   09/06/20 0859  Weight: 134 lb 8 oz (61 kg)    Due to COVID19 we will limit examination to appearance. Patient had no complaints.  GENERAL:alert, no distress and comfortable SKIN: skin color normal, no rashes or significant lesions EYES: normal, Conjunctiva are pink and non-injected, sclera clear  NEURO: alert & oriented x 3 with fluent speech   LABORATORY DATA:  I have reviewed the data as listed CBC Latest Ref Rng & Units 09/06/2020 08/30/2020 08/22/2020  WBC 4.0 - 10.5 K/uL 4.3 4.7 3.5(L)  Hemoglobin 12.0 - 15.0 g/dL 12.2 11.7(L) 11.2(L)  Hematocrit 36 - 46 % 36.8 35.4(L) 33.8(L)  Platelets 150 - 400 K/uL 341 306 411(H)     CMP Latest Ref Rng & Units 08/30/2020 08/22/2020 08/15/2020  Glucose 70 - 99 mg/dL 107(H) 103(H) 119(H)  BUN 8 - 23 mg/dL '13 11 17  ' Creatinine 0.44 - 1.00 mg/dL 0.71 0.72 0.82  Sodium 135 - 145 mmol/L 138 137 135  Potassium 3.5 - 5.1 mmol/L 3.8 4.2 4.0  Chloride 98 - 111 mmol/L 108 106 103  CO2 22 - 32 mmol/L '22 25 26  ' Calcium 8.9 - 10.3 mg/dL 9.4 9.5 9.8  Total Protein 6.5 - 8.1 g/dL 7.3 7.1 8.0  Total Bilirubin 0.3 - 1.2 mg/dL 0.3 <0.2(L) 0.4  Alkaline Phos 38 - 126 U/L 36(L) 40 46  AST 15 - 41 U/L 34 29 30  ALT 0 - 44 U/L '19 14 12      ' RADIOGRAPHIC STUDIES: I have personally reviewed the radiological images as listed and agreed with the findings in the report. No results found.   ASSESSMENT & PLAN:  Doris Ellis is a 79 y.o. female with   1.Left breastcancer, grade 1-2, ER+/PR-/HER2- Ki-67 5%,pT1cN0M0, Grade 1,stage IA, RS 33, -She was diagnosed in 06/2020 with grade 1-2 invasive ductal carcinoma of the left breast.  -She underwent left lumpectomy with SLNB with Dr Lucia Gaskins on 07/05/20.Her surgicalpathology showeda 1.8 cm tumor with 1negativelymph node. Surgical margins were negative.HerOncotype Dx testingshowed high risk disease with recurrence score of 33. -Given high risk disease I recommended  adjuvant chemo to reduce her risk of recurrence. She started weeklypaclitaxel for 12 weeks on10/8/21. Given mild reaction with first dose I changed premedication dexa to Solu-Medrol 125 mg (now on 60m due to insomnia), and increase famotidine to 40 mg  -She continues to tolerate treatment well. She has mild fatigue, no neuropathy or nausea. Labs reviewed and adequate to proceed with week 5 Taxol today.  -  F/u in 2 weeks   2.Osteopenia -DEXA on 04/01/2017 shows osteopenia in the right femur neck with T score -1.6.Her FRAX score shows a 16.9% probability of a major osteoporotic fracture in 10 years and 3.1% risk for hip fracture -We encouraged her to begin calcium and vitamin D once daily and continue weightbearing exercise -Will repeatDEXA after chemo     PLAN: -Lab reviewed, adequate to proceed with week 5 paclitaxel today at same dose.  -lab, flush, Taxol in 1, 2, 3, 4 weeks  -f/u in 2 and 4 weeks   No problem-specific Assessment & Plan notes found for this encounter.   No orders of the defined types were placed in this encounter.  All questions were answered. The patient knows to call the clinic with any problems, questions or concerns. No barriers to learning was detected. The total time spent in the appointment was 30 minutes.     Truitt Merle, MD 09/06/2020   I, Joslyn Devon, am acting as scribe for Truitt Merle, MD.   I have reviewed the above documentation for accuracy and completeness, and I agree with the above.

## 2020-09-06 ENCOUNTER — Other Ambulatory Visit: Payer: Self-pay

## 2020-09-06 ENCOUNTER — Inpatient Hospital Stay: Payer: Medicare Other | Attending: Nurse Practitioner

## 2020-09-06 ENCOUNTER — Inpatient Hospital Stay: Payer: Medicare Other

## 2020-09-06 ENCOUNTER — Encounter: Payer: Self-pay | Admitting: *Deleted

## 2020-09-06 ENCOUNTER — Inpatient Hospital Stay: Payer: Medicare Other | Admitting: Hematology

## 2020-09-06 ENCOUNTER — Encounter: Payer: Self-pay | Admitting: Hematology

## 2020-09-06 ENCOUNTER — Encounter: Payer: Self-pay | Admitting: Medical Oncology

## 2020-09-06 VITALS — BP 134/64 | HR 76 | Temp 97.8°F | Resp 18 | Ht 64.0 in | Wt 134.5 lb

## 2020-09-06 DIAGNOSIS — C50212 Malignant neoplasm of upper-inner quadrant of left female breast: Secondary | ICD-10-CM

## 2020-09-06 DIAGNOSIS — Z5111 Encounter for antineoplastic chemotherapy: Secondary | ICD-10-CM | POA: Insufficient documentation

## 2020-09-06 DIAGNOSIS — Z9079 Acquired absence of other genital organ(s): Secondary | ICD-10-CM | POA: Insufficient documentation

## 2020-09-06 DIAGNOSIS — Z170 Estrogen receptor positive status [ER+]: Secondary | ICD-10-CM

## 2020-09-06 DIAGNOSIS — Z90710 Acquired absence of both cervix and uterus: Secondary | ICD-10-CM | POA: Diagnosis not present

## 2020-09-06 DIAGNOSIS — Z90722 Acquired absence of ovaries, bilateral: Secondary | ICD-10-CM | POA: Diagnosis not present

## 2020-09-06 DIAGNOSIS — F418 Other specified anxiety disorders: Secondary | ICD-10-CM | POA: Diagnosis not present

## 2020-09-06 DIAGNOSIS — I10 Essential (primary) hypertension: Secondary | ICD-10-CM | POA: Insufficient documentation

## 2020-09-06 DIAGNOSIS — M85851 Other specified disorders of bone density and structure, right thigh: Secondary | ICD-10-CM | POA: Insufficient documentation

## 2020-09-06 DIAGNOSIS — E785 Hyperlipidemia, unspecified: Secondary | ICD-10-CM | POA: Diagnosis not present

## 2020-09-06 DIAGNOSIS — Z95828 Presence of other vascular implants and grafts: Secondary | ICD-10-CM

## 2020-09-06 DIAGNOSIS — Z79899 Other long term (current) drug therapy: Secondary | ICD-10-CM | POA: Diagnosis not present

## 2020-09-06 LAB — COMPREHENSIVE METABOLIC PANEL
ALT: 14 U/L (ref 0–44)
AST: 26 U/L (ref 15–41)
Albumin: 3.7 g/dL (ref 3.5–5.0)
Alkaline Phosphatase: 40 U/L (ref 38–126)
Anion gap: 7 (ref 5–15)
BUN: 11 mg/dL (ref 8–23)
CO2: 24 mmol/L (ref 22–32)
Calcium: 9.5 mg/dL (ref 8.9–10.3)
Chloride: 105 mmol/L (ref 98–111)
Creatinine, Ser: 0.7 mg/dL (ref 0.44–1.00)
GFR, Estimated: >60 mL/min (ref >60)
Glucose, Bld: 79 mg/dL (ref 70–99)
Potassium: 4.5 mmol/L (ref 3.5–5.1)
Sodium: 136 mmol/L (ref 135–145)
Total Bilirubin: 0.4 mg/dL (ref 0.3–1.2)
Total Protein: 7.7 g/dL (ref 6.5–8.1)

## 2020-09-06 LAB — CBC WITH DIFFERENTIAL/PLATELET
Abs Immature Granulocytes: 0.04 10*3/uL (ref 0.00–0.07)
Basophils Absolute: 0.1 10*3/uL (ref 0.0–0.1)
Basophils Relative: 3 %
Eosinophils Absolute: 0.1 10*3/uL (ref 0.0–0.5)
Eosinophils Relative: 3 %
HCT: 36.8 % (ref 36.0–46.0)
Hemoglobin: 12.2 g/dL (ref 12.0–15.0)
Immature Granulocytes: 1 %
Lymphocytes Relative: 28 %
Lymphs Abs: 1.2 10*3/uL (ref 0.7–4.0)
MCH: 29.9 pg (ref 26.0–34.0)
MCHC: 33.2 g/dL (ref 30.0–36.0)
MCV: 90.2 fL (ref 80.0–100.0)
Monocytes Absolute: 0.4 10*3/uL (ref 0.1–1.0)
Monocytes Relative: 9 %
Neutro Abs: 2.4 10*3/uL (ref 1.7–7.7)
Neutrophils Relative %: 56 %
Platelets: 341 10*3/uL (ref 150–400)
RBC: 4.08 MIL/uL (ref 3.87–5.11)
RDW: 14.1 % (ref 11.5–15.5)
WBC: 4.3 10*3/uL (ref 4.0–10.5)
nRBC: 0 % (ref 0.0–0.2)

## 2020-09-06 MED ORDER — METHYLPREDNISOLONE SODIUM SUCC 125 MG IJ SOLR
INTRAMUSCULAR | Status: AC
  Filled 2020-09-06: qty 2

## 2020-09-06 MED ORDER — LORATADINE 10 MG PO TABS
10.0000 mg | ORAL_TABLET | Freq: Every day | ORAL | Status: DC
  Administered 2020-09-06: 10 mg via ORAL

## 2020-09-06 MED ORDER — SODIUM CHLORIDE 0.9 % IV SOLN
40.0000 mg | Freq: Once | INTRAVENOUS | Status: AC
  Administered 2020-09-06: 40 mg via INTRAVENOUS
  Filled 2020-09-06: qty 4

## 2020-09-06 MED ORDER — LORATADINE 10 MG PO TABS
ORAL_TABLET | ORAL | Status: AC
  Filled 2020-09-06: qty 1

## 2020-09-06 MED ORDER — SODIUM CHLORIDE 0.9 % IV SOLN
40.0000 mg | Freq: Once | INTRAVENOUS | Status: DC
  Filled 2020-09-06: qty 4

## 2020-09-06 MED ORDER — SODIUM CHLORIDE 0.9% FLUSH
10.0000 mL | Freq: Once | INTRAVENOUS | Status: AC
  Administered 2020-09-06: 10 mL
  Filled 2020-09-06: qty 10

## 2020-09-06 MED ORDER — SODIUM CHLORIDE 0.9 % IV SOLN
80.0000 mg/m2 | Freq: Once | INTRAVENOUS | Status: AC
  Administered 2020-09-06: 138 mg via INTRAVENOUS
  Filled 2020-09-06: qty 23

## 2020-09-06 MED ORDER — SODIUM CHLORIDE 0.9 % IV SOLN
Freq: Once | INTRAVENOUS | Status: AC
  Filled 2020-09-06: qty 250

## 2020-09-06 MED ORDER — HEPARIN SOD (PORK) LOCK FLUSH 100 UNIT/ML IV SOLN
500.0000 [IU] | Freq: Once | INTRAVENOUS | Status: AC | PRN
  Administered 2020-09-06: 500 [IU]
  Filled 2020-09-06: qty 5

## 2020-09-06 MED ORDER — SODIUM CHLORIDE 0.9% FLUSH
10.0000 mL | INTRAVENOUS | Status: DC | PRN
  Administered 2020-09-06: 10 mL
  Filled 2020-09-06: qty 10

## 2020-09-06 MED ORDER — METHYLPREDNISOLONE SODIUM SUCC 125 MG IJ SOLR
80.0000 mg | Freq: Every day | INTRAMUSCULAR | Status: AC
  Administered 2020-09-06: 80 mg via INTRAVENOUS

## 2020-09-06 NOTE — Progress Notes (Signed)
S1714, A PROSPECTIVE OBSERVATIONAL COHORT STUDY TO DEVELOP A PREDICTIVE MODEL OF TAXANE-INDUCED PERIPHERAL NEUROPATHY IN CANCER PATIENTS. Week 4 assessments.  Patient presented to the clinic, alone. I met with patient before her appointments and provided her the study questionnaires to complete. Patient reports to be feeling well and denies any new onset of neuropathy or decreased sensation to either hands or feet.    PROs: Questionnaires were given to patient to complete in clinic upon her arrival. Collected questionnaires and checked for completeness and accuracy.  Labs: Patient did not consent to optional research labs.  Physician Assessments: CTCAE and Treatment Burden forms reviewed and completed by Dr. Feng, signed and dated.  History of Falls: No assessment required at this time point.  Supplements, Topical Agents and other treatments: Reviewed with patient, she denies any changes from baseline.  Assessment for Interventions for CIPN: Reviewed with patient and CRFs completed. Neuropen Assessment: Completed per protocol by this certified research RN, time recording completed by Kory Isley, CRC. [place] Assessment: Completed per protocol by this certified research RN, time recording completed by Kory Isley, CRC. Timed Get Up and Go Test: No assessment required at this time point.  Plan: Informed patient of next study assessments in approximately 4 weeks. Plan for this visit to be approximately 10/04/20, when patient is in clinic for a scheduled treamtnet.  Patient denied having any questions at this time. Patient thanked for her time and continued support of tudy and was encouraged to call clinic for any questions or concerns she may have prior to her next appointment.  Mirjana C. Leonetti, RN, BSN, CCRC Clinical Research 09/06/2020 9:49 AM   

## 2020-09-06 NOTE — Patient Instructions (Signed)
[facility] Discharge Instructions for Patients Receiving Chemotherapy  Today you received the following chemotherapy agents:  Taxol.  To help prevent nausea and vomiting after your treatment, we encourage you to take your nausea medication as directed.   If you develop nausea and vomiting that is not controlled by your nausea medication, call the clinic.   BELOW ARE SYMPTOMS THAT SHOULD BE REPORTED IMMEDIATELY:  *FEVER GREATER THAN 100.5 F  *CHILLS WITH OR WITHOUT FEVER  NAUSEA AND VOMITING THAT IS NOT CONTROLLED WITH YOUR NAUSEA MEDICATION  *UNUSUAL SHORTNESS OF BREATH  *UNUSUAL BRUISING OR BLEEDING  TENDERNESS IN MOUTH AND THROAT WITH OR WITHOUT PRESENCE OF ULCERS  *URINARY PROBLEMS  *BOWEL PROBLEMS  UNUSUAL RASH Items with * indicate a potential emergency and should be followed up as soon as possible.  Feel free to call the clinic should you have any questions or concerns. The clinic phone number is [phone].  Please show the CHEMO ALERT CARD at check-in to the Emergency Department and triage nurse.   

## 2020-09-10 ENCOUNTER — Other Ambulatory Visit: Payer: Self-pay

## 2020-09-10 DIAGNOSIS — R300 Dysuria: Secondary | ICD-10-CM

## 2020-09-10 NOTE — Progress Notes (Signed)
Doris Ellis called stating that she is have a small amount of burning with urination and slight urgency.  She is afebrile.  She does not want to come to [facility] to provide a urine specimen today.  She states she will wait until her appt on Friday 09/13/2020.  I recommended she drink cranberry juice, try over the counter azo for urinary tract.  I also encouraged her to see her PCP whose office is closer to her home.  She verbalized understanding.  She states she will call if the symptoms get worse.

## 2020-09-12 DIAGNOSIS — H524 Presbyopia: Secondary | ICD-10-CM | POA: Diagnosis not present

## 2020-09-12 DIAGNOSIS — Z961 Presence of intraocular lens: Secondary | ICD-10-CM | POA: Diagnosis not present

## 2020-09-13 ENCOUNTER — Inpatient Hospital Stay: Payer: Medicare Other

## 2020-09-13 ENCOUNTER — Other Ambulatory Visit: Payer: Self-pay

## 2020-09-13 VITALS — BP 129/67 | HR 64 | Temp 97.8°F | Resp 18 | Wt 125.2 lb

## 2020-09-13 DIAGNOSIS — Z79899 Other long term (current) drug therapy: Secondary | ICD-10-CM | POA: Diagnosis not present

## 2020-09-13 DIAGNOSIS — I10 Essential (primary) hypertension: Secondary | ICD-10-CM | POA: Diagnosis not present

## 2020-09-13 DIAGNOSIS — R300 Dysuria: Secondary | ICD-10-CM

## 2020-09-13 DIAGNOSIS — E785 Hyperlipidemia, unspecified: Secondary | ICD-10-CM | POA: Diagnosis not present

## 2020-09-13 DIAGNOSIS — C50212 Malignant neoplasm of upper-inner quadrant of left female breast: Secondary | ICD-10-CM | POA: Diagnosis not present

## 2020-09-13 DIAGNOSIS — M85851 Other specified disorders of bone density and structure, right thigh: Secondary | ICD-10-CM | POA: Diagnosis not present

## 2020-09-13 DIAGNOSIS — Z5111 Encounter for antineoplastic chemotherapy: Secondary | ICD-10-CM | POA: Diagnosis not present

## 2020-09-13 DIAGNOSIS — Z170 Estrogen receptor positive status [ER+]: Secondary | ICD-10-CM

## 2020-09-13 DIAGNOSIS — Z95828 Presence of other vascular implants and grafts: Secondary | ICD-10-CM

## 2020-09-13 LAB — URINALYSIS, COMPLETE (UACMP) WITH MICROSCOPIC
Bilirubin Urine: NEGATIVE
Glucose, UA: NEGATIVE mg/dL
Ketones, ur: NEGATIVE mg/dL
Nitrite: NEGATIVE
Protein, ur: 100 mg/dL — AB
Specific Gravity, Urine: 1.004 — ABNORMAL LOW (ref 1.005–1.030)
WBC, UA: >50 WBC/hpf — ABNORMAL HIGH (ref 0–5)
pH: 6 (ref 5.0–8.0)

## 2020-09-13 LAB — CBC WITH DIFFERENTIAL/PLATELET
Abs Immature Granulocytes: 0.03 10*3/uL (ref 0.00–0.07)
Basophils Absolute: 0.1 10*3/uL (ref 0.0–0.1)
Basophils Relative: 1 %
Eosinophils Absolute: 0.1 10*3/uL (ref 0.0–0.5)
Eosinophils Relative: 2 %
HCT: 35.2 % — ABNORMAL LOW (ref 36.0–46.0)
Hemoglobin: 11.6 g/dL — ABNORMAL LOW (ref 12.0–15.0)
Immature Granulocytes: 1 %
Lymphocytes Relative: 22 %
Lymphs Abs: 1.4 10*3/uL (ref 0.7–4.0)
MCH: 30.5 pg (ref 26.0–34.0)
MCHC: 33 g/dL (ref 30.0–36.0)
MCV: 92.6 fL (ref 80.0–100.0)
Monocytes Absolute: 0.4 10*3/uL (ref 0.1–1.0)
Monocytes Relative: 6 %
Neutro Abs: 4.5 10*3/uL (ref 1.7–7.7)
Neutrophils Relative %: 68 %
Platelets: 359 10*3/uL (ref 150–400)
RBC: 3.8 MIL/uL — ABNORMAL LOW (ref 3.87–5.11)
RDW: 14.6 % (ref 11.5–15.5)
WBC: 6.5 10*3/uL (ref 4.0–10.5)
nRBC: 0 % (ref 0.0–0.2)

## 2020-09-13 LAB — COMPREHENSIVE METABOLIC PANEL
ALT: 11 U/L (ref 0–44)
AST: 24 U/L (ref 15–41)
Albumin: 3.5 g/dL (ref 3.5–5.0)
Alkaline Phosphatase: 42 U/L (ref 38–126)
Anion gap: 7 (ref 5–15)
BUN: 12 mg/dL (ref 8–23)
CO2: 22 mmol/L (ref 22–32)
Calcium: 9.2 mg/dL (ref 8.9–10.3)
Chloride: 105 mmol/L (ref 98–111)
Creatinine, Ser: 0.76 mg/dL (ref 0.44–1.00)
GFR, Estimated: >60 mL/min (ref >60)
Glucose, Bld: 105 mg/dL — ABNORMAL HIGH (ref 70–99)
Potassium: 4.5 mmol/L (ref 3.5–5.1)
Sodium: 134 mmol/L — ABNORMAL LOW (ref 135–145)
Total Bilirubin: 0.5 mg/dL (ref 0.3–1.2)
Total Protein: 7.5 g/dL (ref 6.5–8.1)

## 2020-09-13 MED ORDER — SODIUM CHLORIDE 0.9 % IV SOLN
Freq: Once | INTRAVENOUS | Status: AC
  Filled 2020-09-13: qty 250

## 2020-09-13 MED ORDER — SODIUM CHLORIDE 0.9 % IV SOLN
40.0000 mg | Freq: Once | INTRAVENOUS | Status: AC
  Administered 2020-09-13: 40 mg via INTRAVENOUS
  Filled 2020-09-13: qty 4

## 2020-09-13 MED ORDER — LORATADINE 10 MG PO TABS
10.0000 mg | ORAL_TABLET | Freq: Once | ORAL | Status: AC
  Administered 2020-09-13: 10 mg via ORAL

## 2020-09-13 MED ORDER — HEPARIN SOD (PORK) LOCK FLUSH 100 UNIT/ML IV SOLN
500.0000 [IU] | Freq: Once | INTRAVENOUS | Status: AC | PRN
  Administered 2020-09-13: 500 [IU]
  Filled 2020-09-13: qty 5

## 2020-09-13 MED ORDER — SODIUM CHLORIDE 0.9% FLUSH
10.0000 mL | INTRAVENOUS | Status: DC | PRN
  Administered 2020-09-13: 10 mL
  Filled 2020-09-13: qty 10

## 2020-09-13 MED ORDER — METHYLPREDNISOLONE SODIUM SUCC 125 MG IJ SOLR
80.0000 mg | Freq: Every day | INTRAMUSCULAR | Status: AC
  Administered 2020-09-13: 80 mg via INTRAVENOUS

## 2020-09-13 MED ORDER — SODIUM CHLORIDE 0.9 % IV SOLN
80.0000 mg/m2 | Freq: Once | INTRAVENOUS | Status: AC
  Administered 2020-09-13: 138 mg via INTRAVENOUS
  Filled 2020-09-13: qty 23

## 2020-09-13 MED ORDER — LORATADINE 10 MG PO TABS
ORAL_TABLET | ORAL | Status: AC
  Filled 2020-09-13: qty 1

## 2020-09-13 MED ORDER — FAMOTIDINE IN NACL 20-0.9 MG/50ML-% IV SOLN
INTRAVENOUS | Status: AC
  Filled 2020-09-13: qty 50

## 2020-09-13 MED ORDER — SODIUM CHLORIDE 0.9% FLUSH
10.0000 mL | Freq: Once | INTRAVENOUS | Status: AC
  Administered 2020-09-13: 10 mL
  Filled 2020-09-13: qty 10

## 2020-09-13 MED ORDER — METHYLPREDNISOLONE SODIUM SUCC 125 MG IJ SOLR
INTRAMUSCULAR | Status: AC
  Filled 2020-09-13: qty 2

## 2020-09-13 NOTE — Patient Instructions (Signed)
[facility] Discharge Instructions for Patients Receiving Chemotherapy  Today you received the following chemotherapy agent: Taxol.  To help prevent nausea and vomiting after your treatment, we encourage you to take your nausea medication as directed.  If you develop nausea and vomiting that is not controlled by your nausea medication, call the clinic.   BELOW ARE SYMPTOMS THAT SHOULD BE REPORTED IMMEDIATELY:  *FEVER GREATER THAN 100.5 F  *CHILLS WITH OR WITHOUT FEVER  NAUSEA AND VOMITING THAT IS NOT CONTROLLED WITH YOUR NAUSEA MEDICATION  *UNUSUAL SHORTNESS OF BREATH  *UNUSUAL BRUISING OR BLEEDING  TENDERNESS IN MOUTH AND THROAT WITH OR WITHOUT PRESENCE OF ULCERS  *URINARY PROBLEMS  *BOWEL PROBLEMS  UNUSUAL RASH Items with * indicate a potential emergency and should be followed up as soon as possible.  Feel free to call the clinic should you have any questions or concerns. The clinic phone number is [phone].  Please show the CHEMO ALERT CARD at check-in to the Emergency Department and triage nurse.   

## 2020-09-15 LAB — URINE CULTURE: Culture: 20000 — AB

## 2020-09-16 ENCOUNTER — Other Ambulatory Visit: Payer: Self-pay | Admitting: Hematology

## 2020-09-16 ENCOUNTER — Telehealth: Payer: Self-pay

## 2020-09-16 MED ORDER — NITROFURANTOIN MONOHYD MACRO 100 MG PO CAPS: 100.0000 mg | ORAL_CAPSULE | Freq: Two times a day (BID) | ORAL | 0 refills | Status: DC

## 2020-09-16 NOTE — Telephone Encounter (Signed)
Doris Ellis left vm stating she still as UTI symptoms.  Dr. Burr Medico reviewed urine culture antibiotic rx sent to pharmacy.  I called Doris Wos and reviewed with her.  She verbalized understanding.

## 2020-09-18 NOTE — Progress Notes (Signed)
[place] OFFICE PROGRESS NOTE  Doris Noble, MD [address] [place] [address]  DIAGNOSIS: F/u of left breast cancer  Oncology History Overview Note  Cancer Staging Malignant neoplasm of upper-inner quadrant of female breast [facility]) Staging form: Breast, AJCC 8th Edition - Clinical stage from 06/11/2020: Stage IA (cT1b, cN0, cM0, G2, ER+, PR-, HER2: Equivocal) - Signed by Alla Feeling, NP on 07/01/2020 - Pathologic stage from 07/05/2020: Stage IA (pT1c, pN0, cM0, G2, ER+, PR-, HER2-, Oncotype DX score: 33) - Signed by Truitt Merle, MD on 07/29/2020    Malignant neoplasm of upper-inner quadrant of female breast ([place])  05/28/2020 Mammogram   Diagnostic left mammogram/US  -On physical exam, I palpate discrete focal soft thickening in the 10-11 o'clock location of the LEFT breast. -Targeted ultrasound is performed, showing an irregular taller than wide hypoechoic mass with indistinct margins in the 10 o'clock location of the LEFT breast 8 centimeters from the nipple. There is associated posterior acoustic shadowing. No significant internal blood flow identified on Doppler evaluation.  IMPRESSION: Suspicious mass in the 10 o'clock location of the LEFT breast. No LEFT axillary adenopathy.     06/11/2020 Cancer Staging   Staging form: Breast, AJCC 8th Edition - Clinical stage from 06/11/2020: Stage IA (cT1b, cN0, cM0, G2, ER+, PR-, HER2: Equivocal) - Signed by Alla Feeling, NP on 07/01/2020   06/11/2020 Initial Biopsy   FINAL MICROSCOPIC DIAGNOSIS: A. BREAST, 10:00, LEFT, BIOPSY: - Invasive ductal carcinoma The carcinoma appears Nottingham grade 1-2 of 3 and measures 0.9 cm in greatest linear extent  PROGNOSTIC INDICATOR RESULTS: The tumor cells are EQUIVOCAL for Her2 (2+) Estrogen Receptor: 90%, MODERATE STAINING INTENSITY Progesterone Receptor: NEGATIVE Proliferation Marker Ki-67: 5%  FLOURESCENCE IN-SITU HYBRIDIZATION RESULTS: GROUP 5: HER2  **NEGATIVE** RATIO of HER2/CEN 17 SIGNALS: 1.18 AVERAGE HER2 COPY NUMBER PER CELL: 1.30   07/01/2020 Initial Diagnosis   Malignant neoplasm of upper-inner quadrant of female breast ([place])   07/05/2020 Surgery   LEFT BREAST LUMPECTOMY WITH RADIOACTIVE SEED AND LEFT AXILLARY SENTINEL LYMPH NODE BIOPSY by Dr Lucia Gaskins    07/05/2020 Pathology Results   FINAL MICROSCOPIC DIAGNOSIS:   A. BREAST, LEFT, LUMPECTOMY:  - Invasive ductal carcinoma, 1.8 cm.  - Margins not involved.  - Biopsy site and biopsy clip.  - See oncology table.   B. LYMPH NODE, LEFT AXILLARY, SENTINEL, EXCISION:  - One lymph node with no metastatic carcinoma (0/1).    07/05/2020 Oncotype testing   Oncotype  Recurrence Score 33 Distant recurrence risk at 9 years with Tamoxifen alone is 21% There is >15% benefit of adjuvant chemotherapy.    07/05/2020 Cancer Staging   Staging form: Breast, AJCC 8th Edition - Pathologic stage from 07/05/2020: Stage IA (pT1c, pN0, cM0, G2, ER+, PR-, HER2-, Oncotype DX score: 33) - Signed by Truitt Merle, MD on 07/29/2020   08/09/2020 -  Chemotherapy   Weekly Taxol for 12 weeks starting 08/09/20      CURRENT THERAPY: Weekly paclitaxel 80 mg/m2. She is here for week 7 of 12 today.   INTERVAL HISTORY: Doris Ellis 79 y.o. female returns to the clinic today for a follow up visit. The patient is feeling well today without any concerning complaints. She was recently treated for a UTI. Her symptoms have resolved at this time. She felt unwell over last weekend with the UTI but felt better once she started antibiotics. She has one more pill left to take of her macrobid. She has been tolerating her  treatment well without any adverse side effects.  She denies fevers, chills, night sweats, or weight loss. Denies any chest pain, shortness of breath, cough, or hemoptysis. Denies any nausea, vomiting, diarrhea, or constipation.  She reports she had some sneezing last week but she started using her humidifier and flonase  and feels better. She denies peripheral neuropathy. Her energy and appetite is good. The patient is here today for evaluation prior to starting cycle # 7.  MEDICAL HISTORY: Past Medical History:  Diagnosis Date  . Anxiety   . Arthritis   . Cancer ([place]) 05/2020   left breast IDC  . Concussion 1985  . Depression   . Hyperlipidemia   . Hypertension   . Mild concussion 2002   tripped over bag at work  . Osteopenia 07/2011   T score -1.8 FRAX 11%/1.5%  . Panic attacks   . Seasonal allergies     ALLERGIES:  is allergic to lipitor [atorvastatin calcium], zocor [simvastatin - high dose], and benadryl [diphenhydramine].  MEDICATIONS:  Current Outpatient Medications  Medication Sig Dispense Refill  . ALPRAZolam (XANAX) 1 MG tablet Take 1 mg by mouth at bedtime as needed for sleep.    Marland Kitchen amLODipine (NORVASC) 2.5 MG tablet Take 2.5 mg by mouth daily.    . cyclobenzaprine (FLEXERIL) 10 MG tablet Take 1 tablet (10 mg total) by mouth 3 (three) times daily as needed (muscle spasms). 30 tablet 0  . fluticasone (FLONASE) 50 MCG/ACT nasal spray Place into both nostrils daily.    Marland Kitchen lidocaine-prilocaine (EMLA) cream Apply to affected area once 30 g 3  . multivitamin (THERAGRAN) per tablet Take 1 tablet by mouth daily.      . naproxen sodium (ALEVE) 220 MG tablet Take 220 mg by mouth.    . nitrofurantoin, macrocrystal-monohydrate, (MACROBID) 100 MG capsule Take 1 capsule (100 mg total) by mouth 2 (two) times daily. 10 capsule 0  . ondansetron (ZOFRAN) 8 MG tablet Take 1 tablet (8 mg total) by mouth 2 (two) times daily as needed (Nausea or vomiting). 30 tablet 1  . pravastatin (PRAVACHOL) 10 MG tablet Take 10 mg by mouth daily.      . prochlorperazine (COMPAZINE) 10 MG tablet Take 1 tablet (10 mg total) by mouth every 6 (six) hours as needed (Nausea or vomiting). 30 tablet 1  . Pyridoxine HCl (VITAMIN B-6 PO) Take 1 tablet by mouth daily.     . sertraline (ZOLOFT) 50 MG tablet Take 50 mg by mouth daily.     Marland Kitchen sulfamethoxazole-trimethoprim (BACTRIM DS) 800-160 MG tablet Take 1 tablet by mouth 2 (two) times daily.    . traMADol (ULTRAM) 50 MG tablet Take 1 tablet (50 mg total) by mouth every 6 (six) hours as needed for moderate pain or severe pain. 12 tablet 0   No current facility-administered medications for this visit.    SURGICAL HISTORY:  Past Surgical History:  Procedure Laterality Date  . ABDOMINAL HYSTERECTOMY  april 1990   TAH BSO  . BACK SURGERY  1996   ruputured disc L-5  . BLADDER SUSPENSION  1982  . BREAST BIOPSY Left   . BREAST LUMPECTOMY WITH RADIOACTIVE SEED AND SENTINEL LYMPH NODE BIOPSY Left 07/05/2020   Procedure: LEFT BREAST LUMPECTOMY WITH RADIOACTIVE SEED AND LEFT AXILLARY SENTINEL LYMPH NODE BIOPSY;  Surgeon: Alphonsa Overall, MD;  Location: [place];  Service: General;  Laterality: Left;  LEFT BREAST CANCER  . BREAST SURGERY  1984   cyst-benign  . COLONOSCOPY N/A 03/30/2013  Procedure: COLONOSCOPY;  Surgeon: Rogene Houston, MD;  [facility];  Service: Endoscopy;  Laterality: N/A;  930  . DILATION AND CURETTAGE OF UTERUS  12/09/1988   abnormal uterine bleeding  . IR IMAGING GUIDED PORT INSERTION  08/05/2020    REVIEW OF SYSTEMS:   Review of Systems  Constitutional: Negative for appetite change, chills, fatigue, fever and unexpected weight change.  HENT: Negative for mouth sores, nosebleeds, sore throat and trouble swallowing.   Eyes: Negative for eye problems and icterus.  Respiratory: Negative for cough, hemoptysis, shortness of breath and wheezing.   Cardiovascular: Negative for chest pain and leg swelling.  Gastrointestinal: Negative for abdominal pain, constipation, diarrhea, nausea and vomiting.  Genitourinary: Negative for bladder incontinence, difficulty urinating, dysuria, frequency and hematuria.   Musculoskeletal: Negative for back pain, gait problem, neck pain and neck stiffness.  Skin: Negative for itching and rash.   Neurological: Negative for dizziness, extremity weakness, gait problem, headaches, light-headedness and seizures.  Hematological: Negative for adenopathy. Does not bruise/bleed easily.  Psychiatric/Behavioral: Negative for confusion, depression and sleep disturbance. The patient is not nervous/anxious.     PHYSICAL EXAMINATION:  Blood pressure (!) 152/64, pulse 73, temperature 98.1 F (36.7 C), temperature source Tympanic, resp. rate 20, height _0  (1.626 m), weight 136 lb 9.6 oz (62 kg), last menstrual period 03/05/1989, SpO2 99 %.  ECOG PERFORMANCE STATUS: 1 - Symptomatic but completely ambulatory  Physical Exam  Constitutional: Oriented to person, place, and time and well-developed, well-nourished, and in no distress.  HENT:  Head: Normocephalic and atraumatic.  Mouth/Throat: Oropharynx is clear and moist. No oropharyngeal exudate.  Eyes: Conjunctivae are normal. Right eye exhibits no discharge. Left eye exhibits no discharge. No scleral icterus.  Neck: Normal range of motion. Neck supple.  Cardiovascular: Normal rate, regular rhythm, normal heart sounds and intact distal pulses.   Pulmonary/Chest: Effort normal and breath sounds normal. No respiratory distress. No wheezes. No rales.  Abdominal: Soft. Bowel sounds are normal. Exhibits no distension and no mass. There is no tenderness.  Musculoskeletal: Normal range of motion. Exhibits no edema.  Lymphadenopathy:    No cervical adenopathy.  Neurological: Alert and oriented to person, place, and time. Exhibits normal muscle tone. Gait normal. Coordination normal.  Skin: Skin is warm and dry. No rash noted. Not diaphoretic. No erythema. No pallor.  Psychiatric: Mood, memory and judgment normal.  Vitals reviewed.  LABORATORY DATA: Lab Results  Component Value Date   WBC 5.7 09/20/2020   HGB 11.6 (L) 09/20/2020   HCT 34.7 (L) 09/20/2020   MCV 91.3 09/20/2020   PLT 365 09/20/2020      Chemistry      Component Value Date/Time    NA 134 (L) 09/13/2020 1041   K 4.5 09/13/2020 1041   CL 105 09/13/2020 1041   CO2 22 09/13/2020 1041   BUN 12 09/13/2020 1041   CREATININE 0.76 09/13/2020 1041      Component Value Date/Time   CALCIUM 9.2 09/13/2020 1041   ALKPHOS 42 09/13/2020 1041   AST 24 09/13/2020 1041   ALT 11 09/13/2020 1041   BILITOT 0.5 09/13/2020 1041       RADIOGRAPHIC STUDIES:  No results found.   ASSESSMENT/PLAN:  Doris Ellis a 79 y.o.femalewith   1.Left breastcancer, grade 1-2, ER+/PR-/HER2- Ki-67 5%,pT1cN0M0, Grade 1,stage IA, RS 33, -She was diagnosed in 06/2020 with grade 1-2 invasive ductal carcinoma of the left breast.  -She underwent left lumpectomy with SLNB with Dr Lucia Gaskins on  07/05/20. Dr. Burr Medico previously discussed her pathology which showeda 1.8 cm tumor with 1 - lymph nodes. Surgical margins were negative. -Given the early stage disease, she does not need staging scans. -Dr. Burr Medico previously also discussed her Oncotype Dx testing of surgical sample which showedhigh risk disease with recurrence score of 33. The estimated distant recurrence is 21% in the next 9 years. -Dr. Burr Medico previously discussed adjuvant chemotherapy to reduce her risk of future recurrence, given the high risk disease. She discussed the option of ddAC-T, TC and weekly Taxol.She is 79 year old, also functions very well at home, and has good baseline health, she has very limited social support.Dr. Burr Medico recommended weekly paclitaxel for 12 weeks as her adjuvant chemotherapy. -She is enrolled in the neuropathy study -Dr. Burr Medico previously also discussed the benefit of adjuvant radiation and adjuvant antiestrogen therapy.  -The patient is here today (09/20/20) for week 7 of her treatment. Labs were reviewed. Recommend that she proceed with week #7 of 12 today as scheduled.  -She will follow up in 2 weeks before starting week #9.   2.Osteopenia -DEXA on 04/01/2017 shows osteopenia in the right femur neck with  T score -1.6.Her FRAX score shows a 16.9% probability of a major osteoporotic fracture in 10 years and 3.1% risk for hip fracture -We encouraged her to begin calcium and vitamin D once daily and continue weightbearing exercise -Will repeatDEXAafter chemo  3. Urinary tract Infection -Patient treated with macrobid -Symptoms resolved at this  (09/20/20)   Plan: -Labs reviewed adequate to proceed with week #7 of taxol today as scheduled at the same dose.  -Labs, flush, and taxol in 1,2, 3, and 4 weeks.  -Follow up in 2 weeks and 4 weeks.          No orders of the defined types were placed in this encounter.    Doris Pelissier L Prosperity Darrough, PA-C 09/20/20

## 2020-09-20 ENCOUNTER — Inpatient Hospital Stay: Payer: Medicare Other

## 2020-09-20 ENCOUNTER — Other Ambulatory Visit: Payer: Self-pay

## 2020-09-20 ENCOUNTER — Inpatient Hospital Stay (HOSPITAL_BASED_OUTPATIENT_CLINIC_OR_DEPARTMENT_OTHER): Payer: Medicare Other | Admitting: Physician Assistant

## 2020-09-20 VITALS — BP 152/64 | HR 73 | Temp 98.1°F | Resp 20 | Ht 64.0 in | Wt 136.6 lb

## 2020-09-20 DIAGNOSIS — C50212 Malignant neoplasm of upper-inner quadrant of left female breast: Secondary | ICD-10-CM

## 2020-09-20 DIAGNOSIS — E785 Hyperlipidemia, unspecified: Secondary | ICD-10-CM | POA: Diagnosis not present

## 2020-09-20 DIAGNOSIS — Z79899 Other long term (current) drug therapy: Secondary | ICD-10-CM | POA: Diagnosis not present

## 2020-09-20 DIAGNOSIS — Z170 Estrogen receptor positive status [ER+]: Secondary | ICD-10-CM | POA: Diagnosis not present

## 2020-09-20 DIAGNOSIS — I10 Essential (primary) hypertension: Secondary | ICD-10-CM | POA: Diagnosis not present

## 2020-09-20 DIAGNOSIS — Z5111 Encounter for antineoplastic chemotherapy: Secondary | ICD-10-CM | POA: Diagnosis not present

## 2020-09-20 DIAGNOSIS — Z95828 Presence of other vascular implants and grafts: Secondary | ICD-10-CM

## 2020-09-20 DIAGNOSIS — M85851 Other specified disorders of bone density and structure, right thigh: Secondary | ICD-10-CM | POA: Diagnosis not present

## 2020-09-20 LAB — COMPREHENSIVE METABOLIC PANEL
ALT: 9 U/L (ref 0–44)
AST: 25 U/L (ref 15–41)
Albumin: 3.7 g/dL (ref 3.5–5.0)
Alkaline Phosphatase: 40 U/L (ref 38–126)
Anion gap: 9 (ref 5–15)
BUN: 13 mg/dL (ref 8–23)
CO2: 23 mmol/L (ref 22–32)
Calcium: 9.3 mg/dL (ref 8.9–10.3)
Chloride: 105 mmol/L (ref 98–111)
Creatinine, Ser: 0.73 mg/dL (ref 0.44–1.00)
GFR, Estimated: >60 mL/min (ref >60)
Glucose, Bld: 111 mg/dL — ABNORMAL HIGH (ref 70–99)
Potassium: 4.1 mmol/L (ref 3.5–5.1)
Sodium: 137 mmol/L (ref 135–145)
Total Bilirubin: 0.4 mg/dL (ref 0.3–1.2)
Total Protein: 7.5 g/dL (ref 6.5–8.1)

## 2020-09-20 LAB — CBC WITH DIFFERENTIAL/PLATELET
Abs Immature Granulocytes: 0.08 10*3/uL — ABNORMAL HIGH (ref 0.00–0.07)
Basophils Absolute: 0.1 10*3/uL (ref 0.0–0.1)
Basophils Relative: 2 %
Eosinophils Absolute: 0.1 10*3/uL (ref 0.0–0.5)
Eosinophils Relative: 2 %
HCT: 34.7 % — ABNORMAL LOW (ref 36.0–46.0)
Hemoglobin: 11.6 g/dL — ABNORMAL LOW (ref 12.0–15.0)
Immature Granulocytes: 1 %
Lymphocytes Relative: 26 %
Lymphs Abs: 1.5 10*3/uL (ref 0.7–4.0)
MCH: 30.5 pg (ref 26.0–34.0)
MCHC: 33.4 g/dL (ref 30.0–36.0)
MCV: 91.3 fL (ref 80.0–100.0)
Monocytes Absolute: 0.5 10*3/uL (ref 0.1–1.0)
Monocytes Relative: 8 %
Neutro Abs: 3.5 10*3/uL (ref 1.7–7.7)
Neutrophils Relative %: 61 %
Platelets: 365 10*3/uL (ref 150–400)
RBC: 3.8 MIL/uL — ABNORMAL LOW (ref 3.87–5.11)
RDW: 15.1 % (ref 11.5–15.5)
WBC: 5.7 10*3/uL (ref 4.0–10.5)
nRBC: 0 % (ref 0.0–0.2)

## 2020-09-20 MED ORDER — SODIUM CHLORIDE 0.9% FLUSH
10.0000 mL | INTRAVENOUS | Status: DC | PRN
  Administered 2020-09-20: 10 mL
  Filled 2020-09-20: qty 10

## 2020-09-20 MED ORDER — LORATADINE 10 MG PO TABS
ORAL_TABLET | ORAL | Status: AC
  Filled 2020-09-20: qty 1

## 2020-09-20 MED ORDER — SODIUM CHLORIDE 0.9 % IV SOLN
40.0000 mg | Freq: Once | INTRAVENOUS | Status: AC
  Administered 2020-09-20: 40 mg via INTRAVENOUS
  Filled 2020-09-20: qty 4

## 2020-09-20 MED ORDER — SODIUM CHLORIDE 0.9 % IV SOLN
Freq: Once | INTRAVENOUS | Status: AC
  Filled 2020-09-20: qty 250

## 2020-09-20 MED ORDER — SODIUM CHLORIDE 0.9% FLUSH
10.0000 mL | Freq: Once | INTRAVENOUS | Status: AC
  Administered 2020-09-20: 10 mL
  Filled 2020-09-20: qty 10

## 2020-09-20 MED ORDER — METHYLPREDNISOLONE SODIUM SUCC 125 MG IJ SOLR
INTRAMUSCULAR | Status: AC
  Filled 2020-09-20: qty 2

## 2020-09-20 MED ORDER — HEPARIN SOD (PORK) LOCK FLUSH 100 UNIT/ML IV SOLN
500.0000 [IU] | Freq: Once | INTRAVENOUS | Status: AC | PRN
  Administered 2020-09-20: 500 [IU]
  Filled 2020-09-20: qty 5

## 2020-09-20 MED ORDER — LORATADINE 10 MG PO TABS
10.0000 mg | ORAL_TABLET | Freq: Every day | ORAL | Status: DC
  Administered 2020-09-20: 10 mg via ORAL

## 2020-09-20 MED ORDER — METHYLPREDNISOLONE SODIUM SUCC 125 MG IJ SOLR
80.0000 mg | Freq: Every day | INTRAMUSCULAR | Status: AC
  Administered 2020-09-20: 80 mg via INTRAVENOUS

## 2020-09-20 MED ORDER — FAMOTIDINE IN NACL 20-0.9 MG/50ML-% IV SOLN
INTRAVENOUS | Status: AC
  Filled 2020-09-20: qty 50

## 2020-09-20 MED ORDER — SODIUM CHLORIDE 0.9 % IV SOLN
80.0000 mg/m2 | Freq: Once | INTRAVENOUS | Status: AC
  Administered 2020-09-20: 138 mg via INTRAVENOUS
  Filled 2020-09-20: qty 23

## 2020-09-20 NOTE — Patient Instructions (Signed)
[facility] Discharge Instructions for Patients Receiving Chemotherapy  Today you received the following chemotherapy agent: Taxol.  To help prevent nausea and vomiting after your treatment, we encourage you to take your nausea medication as directed.  If you develop nausea and vomiting that is not controlled by your nausea medication, call the clinic.   BELOW ARE SYMPTOMS THAT SHOULD BE REPORTED IMMEDIATELY:  *FEVER GREATER THAN 100.5 F  *CHILLS WITH OR WITHOUT FEVER  NAUSEA AND VOMITING THAT IS NOT CONTROLLED WITH YOUR NAUSEA MEDICATION  *UNUSUAL SHORTNESS OF BREATH  *UNUSUAL BRUISING OR BLEEDING  TENDERNESS IN MOUTH AND THROAT WITH OR WITHOUT PRESENCE OF ULCERS  *URINARY PROBLEMS  *BOWEL PROBLEMS  UNUSUAL RASH Items with * indicate a potential emergency and should be followed up as soon as possible.  Feel free to call the clinic should you have any questions or concerns. The clinic phone number is [phone].  Please show the CHEMO ALERT CARD at check-in to the Emergency Department and triage nurse.   

## 2020-09-27 ENCOUNTER — Other Ambulatory Visit: Payer: Self-pay

## 2020-09-27 ENCOUNTER — Inpatient Hospital Stay: Payer: Medicare Other

## 2020-09-27 VITALS — BP 132/58 | HR 70 | Temp 98.4°F | Resp 18

## 2020-09-27 DIAGNOSIS — Z95828 Presence of other vascular implants and grafts: Secondary | ICD-10-CM

## 2020-09-27 DIAGNOSIS — C50212 Malignant neoplasm of upper-inner quadrant of left female breast: Secondary | ICD-10-CM

## 2020-09-27 DIAGNOSIS — Z170 Estrogen receptor positive status [ER+]: Secondary | ICD-10-CM | POA: Diagnosis not present

## 2020-09-27 DIAGNOSIS — E785 Hyperlipidemia, unspecified: Secondary | ICD-10-CM | POA: Diagnosis not present

## 2020-09-27 DIAGNOSIS — Z79899 Other long term (current) drug therapy: Secondary | ICD-10-CM | POA: Diagnosis not present

## 2020-09-27 DIAGNOSIS — I10 Essential (primary) hypertension: Secondary | ICD-10-CM | POA: Diagnosis not present

## 2020-09-27 DIAGNOSIS — M85851 Other specified disorders of bone density and structure, right thigh: Secondary | ICD-10-CM | POA: Diagnosis not present

## 2020-09-27 DIAGNOSIS — Z5111 Encounter for antineoplastic chemotherapy: Secondary | ICD-10-CM | POA: Diagnosis not present

## 2020-09-27 LAB — COMPREHENSIVE METABOLIC PANEL
ALT: 8 U/L (ref 0–44)
AST: 22 U/L (ref 15–41)
Albumin: 3.5 g/dL (ref 3.5–5.0)
Alkaline Phosphatase: 40 U/L (ref 38–126)
Anion gap: 10 (ref 5–15)
BUN: 12 mg/dL (ref 8–23)
CO2: 19 mmol/L — ABNORMAL LOW (ref 22–32)
Calcium: 9 mg/dL (ref 8.9–10.3)
Chloride: 107 mmol/L (ref 98–111)
Creatinine, Ser: 0.71 mg/dL (ref 0.44–1.00)
GFR, Estimated: >60 mL/min (ref >60)
Glucose, Bld: 122 mg/dL — ABNORMAL HIGH (ref 70–99)
Potassium: 4.5 mmol/L (ref 3.5–5.1)
Sodium: 136 mmol/L (ref 135–145)
Total Bilirubin: 0.3 mg/dL (ref 0.3–1.2)
Total Protein: 7.1 g/dL (ref 6.5–8.1)

## 2020-09-27 LAB — CBC WITH DIFFERENTIAL/PLATELET
Abs Immature Granulocytes: 0.04 10*3/uL (ref 0.00–0.07)
Basophils Absolute: 0.1 10*3/uL (ref 0.0–0.1)
Basophils Relative: 1 %
Eosinophils Absolute: 0.1 10*3/uL (ref 0.0–0.5)
Eosinophils Relative: 2 %
HCT: 33.5 % — ABNORMAL LOW (ref 36.0–46.0)
Hemoglobin: 10.9 g/dL — ABNORMAL LOW (ref 12.0–15.0)
Immature Granulocytes: 1 %
Lymphocytes Relative: 22 %
Lymphs Abs: 1.1 10*3/uL (ref 0.7–4.0)
MCH: 30.3 pg (ref 26.0–34.0)
MCHC: 32.5 g/dL (ref 30.0–36.0)
MCV: 93.1 fL (ref 80.0–100.0)
Monocytes Absolute: 0.4 10*3/uL (ref 0.1–1.0)
Monocytes Relative: 8 %
Neutro Abs: 3.4 10*3/uL (ref 1.7–7.7)
Neutrophils Relative %: 66 %
Platelets: 373 10*3/uL (ref 150–400)
RBC: 3.6 MIL/uL — ABNORMAL LOW (ref 3.87–5.11)
RDW: 15.5 % (ref 11.5–15.5)
WBC: 5.1 10*3/uL (ref 4.0–10.5)
nRBC: 0 % (ref 0.0–0.2)

## 2020-09-27 MED ORDER — SODIUM CHLORIDE 0.9% FLUSH
10.0000 mL | INTRAVENOUS | Status: DC | PRN
  Administered 2020-09-27: 10 mL
  Filled 2020-09-27: qty 10

## 2020-09-27 MED ORDER — SODIUM CHLORIDE 0.9% FLUSH
10.0000 mL | Freq: Once | INTRAVENOUS | Status: AC
  Administered 2020-09-27: 10 mL
  Filled 2020-09-27: qty 10

## 2020-09-27 MED ORDER — SODIUM CHLORIDE 0.9 % IV SOLN
40.0000 mg | Freq: Once | INTRAVENOUS | Status: AC
  Administered 2020-09-27: 40 mg via INTRAVENOUS
  Filled 2020-09-27: qty 4

## 2020-09-27 MED ORDER — LORATADINE 10 MG PO TABS
ORAL_TABLET | ORAL | Status: AC
  Filled 2020-09-27: qty 1

## 2020-09-27 MED ORDER — LORATADINE 10 MG PO TABS
10.0000 mg | ORAL_TABLET | Freq: Once | ORAL | Status: AC
  Administered 2020-09-27: 10 mg via ORAL

## 2020-09-27 MED ORDER — SODIUM CHLORIDE 0.9 % IV SOLN
Freq: Once | INTRAVENOUS | Status: AC
  Filled 2020-09-27: qty 250

## 2020-09-27 MED ORDER — METHYLPREDNISOLONE SODIUM SUCC 125 MG IJ SOLR
INTRAMUSCULAR | Status: AC
  Filled 2020-09-27: qty 2

## 2020-09-27 MED ORDER — HEPARIN SOD (PORK) LOCK FLUSH 100 UNIT/ML IV SOLN
500.0000 [IU] | Freq: Once | INTRAVENOUS | Status: AC | PRN
  Administered 2020-09-27: 500 [IU]
  Filled 2020-09-27: qty 5

## 2020-09-27 MED ORDER — METHYLPREDNISOLONE SODIUM SUCC 125 MG IJ SOLR
80.0000 mg | Freq: Every day | INTRAMUSCULAR | Status: AC
  Administered 2020-09-27: 80 mg via INTRAVENOUS

## 2020-09-27 MED ORDER — SODIUM CHLORIDE 0.9 % IV SOLN
80.0000 mg/m2 | Freq: Once | INTRAVENOUS | Status: AC
  Administered 2020-09-27: 138 mg via INTRAVENOUS
  Filled 2020-09-27: qty 23

## 2020-09-27 NOTE — Patient Instructions (Signed)
[facility] Discharge Instructions for Patients Receiving Chemotherapy  Today you received the following chemotherapy agents taxol  To help prevent nausea and vomiting after your treatment, we encourage you to take your nausea medication as directed   If you develop nausea and vomiting that is not controlled by your nausea medication, call the clinic.   BELOW ARE SYMPTOMS THAT SHOULD BE REPORTED IMMEDIATELY:  *FEVER GREATER THAN 100.5 F  *CHILLS WITH OR WITHOUT FEVER  NAUSEA AND VOMITING THAT IS NOT CONTROLLED WITH YOUR NAUSEA MEDICATION  *UNUSUAL SHORTNESS OF BREATH  *UNUSUAL BRUISING OR BLEEDING  TENDERNESS IN MOUTH AND THROAT WITH OR WITHOUT PRESENCE OF ULCERS  *URINARY PROBLEMS  *BOWEL PROBLEMS  UNUSUAL RASH Items with * indicate a potential emergency and should be followed up as soon as possible.  Feel free to call the clinic should you have any questions or concerns. The clinic phone number is [phone].  Please show the CHEMO ALERT CARD at check-in to the Emergency Department and triage nurse.   

## 2020-09-27 NOTE — Progress Notes (Signed)
Patient discharged in stable condition.

## 2020-09-30 ENCOUNTER — Other Ambulatory Visit: Payer: Self-pay | Admitting: Hematology

## 2020-09-30 NOTE — Telephone Encounter (Signed)
For your review

## 2020-10-02 NOTE — Progress Notes (Signed)
Doris Ellis   [phone] [phone]   Clinic Follow up Note   Patient Care Team: Asencion Noble, MD as PCP - General (Internal Medicine) Rockwell Germany, RN as Oncology Nurse Navigator Mauro Kaufmann, RN as Oncology Nurse Navigator Alphonsa Overall, MD as Consulting Physician (General Surgery) Kyung Rudd, MD as Consulting Physician (Radiation Oncology) Truitt Merle, MD as Consulting Physician (Hematology) Alla Feeling, NP as Nurse Practitioner (Nurse Practitioner)  Date of Service:  10/04/2020  CHIEF COMPLAINT: F/u of left breast cancer  SUMMARY OF ONCOLOGIC HISTORY: Oncology History Overview Note  Cancer Staging Malignant neoplasm of upper-inner quadrant of female breast [facility]) Staging form: Breast, AJCC 8th Edition - Clinical stage from 06/11/2020: Stage IA (cT1b, cN0, cM0, G2, ER+, PR-, HER2: Equivocal) - Signed by Alla Feeling, NP on 07/01/2020 - Pathologic stage from 07/05/2020: Stage IA (pT1c, pN0, cM0, G2, ER+, PR-, HER2-, Oncotype DX score: 33) - Signed by Truitt Merle, MD on 07/29/2020    Malignant neoplasm of upper-inner quadrant of female breast ([place])  05/28/2020 Mammogram   Diagnostic left mammogram/US  -On physical exam, I palpate discrete focal soft thickening in the 10-11 o'clock location of the LEFT breast. -Targeted ultrasound is performed, showing an irregular taller than wide hypoechoic mass with indistinct margins in the 10 o'clock location of the LEFT breast 8 centimeters from the nipple. There is associated posterior acoustic shadowing. No significant internal blood flow identified on Doppler evaluation.  IMPRESSION: Suspicious mass in the 10 o'clock location of the LEFT breast. No LEFT axillary adenopathy.     06/11/2020 Cancer Staging   Staging form: Breast, AJCC 8th Edition - Clinical stage from 06/11/2020: Stage IA (cT1b, cN0, cM0, G2, ER+, PR-, HER2: Equivocal) - Signed by Alla Feeling, NP on 07/01/2020   06/11/2020  Initial Biopsy   FINAL MICROSCOPIC DIAGNOSIS: A. BREAST, 10:00, LEFT, BIOPSY: - Invasive ductal carcinoma The carcinoma appears Nottingham grade 1-2 of 3 and measures 0.9 cm in greatest linear extent  PROGNOSTIC INDICATOR RESULTS: The tumor cells are EQUIVOCAL for Her2 (2+) Estrogen Receptor: 90%, MODERATE STAINING INTENSITY Progesterone Receptor: NEGATIVE Proliferation Marker Ki-67: 5%  FLOURESCENCE IN-SITU HYBRIDIZATION RESULTS: GROUP 5: HER2 **NEGATIVE** RATIO of HER2/CEN 17 SIGNALS: 1.18 AVERAGE HER2 COPY NUMBER PER CELL: 1.30   07/01/2020 Initial Diagnosis   Malignant neoplasm of upper-inner quadrant of female breast ([place])   07/05/2020 Surgery   LEFT BREAST LUMPECTOMY WITH RADIOACTIVE SEED AND LEFT AXILLARY SENTINEL LYMPH NODE BIOPSY by Dr Lucia Gaskins    07/05/2020 Pathology Results   FINAL MICROSCOPIC DIAGNOSIS:   A. BREAST, LEFT, LUMPECTOMY:  - Invasive ductal carcinoma, 1.8 cm.  - Margins not involved.  - Biopsy site and biopsy clip.  - See oncology table.   B. LYMPH NODE, LEFT AXILLARY, SENTINEL, EXCISION:  - One lymph node with no metastatic carcinoma (0/1).    07/05/2020 Oncotype testing   Oncotype  Recurrence Score 33 Distant recurrence risk at 9 years with Tamoxifen alone is 21% There is >15% benefit of adjuvant chemotherapy.    07/05/2020 Cancer Staging   Staging form: Breast, AJCC 8th Edition - Pathologic stage from 07/05/2020: Stage IA (pT1c, pN0, cM0, G2, ER+, PR-, HER2-, Oncotype DX score: 33) - Signed by Truitt Merle, MD on 07/29/2020   08/09/2020 -  Chemotherapy   Weekly Taxol for 12 weeks starting 08/09/20       CURRENT THERAPY:  Weekly Taxol for 12 weeks starting 08/09/20  INTERVAL HISTORY:  Greydis L Varone is here  for a follow up. She presents to the clinic alone. She notes she is doing well. She notes due to dry air she had nose bleeding in the middle of the night. This will wake her up due to breathing. She uses humidifier and willing to try nasal spray. She  denies neuropathy at this time. She continues to use ice bags during infusion. She notes she has been able to walk a mile this week and remains active. She notes multiple cat scratches on her arms.     REVIEW OF SYSTEMS:   Constitutional: Denies fevers, chills or abnormal weight loss Eyes: Denies blurriness of vision Ears, nose, mouth, throat, and face: Denies mucositis or sore throat Respiratory: Denies cough, dyspnea or wheezes Cardiovascular: Denies palpitation, chest discomfort or lower extremity swelling Gastrointestinal:  Denies nausea, heartburn or change in bowel habits Skin: Denies abnormal skin rashes Lymphatics: Denies new lymphadenopathy or easy bruising Neurological:Denies numbness, tingling or new weaknesses Behavioral/Psych: Mood is stable, no new changes  All other systems were reviewed with the patient and are negative.  MEDICAL HISTORY:  Past Medical History:  Diagnosis Date  . Anxiety   . Arthritis   . Cancer ([place]) 05/2020   left breast IDC  . Concussion 1985  . Depression   . Hyperlipidemia   . Hypertension   . Mild concussion 2002   tripped over bag at work  . Osteopenia 07/2011   T score -1.8 FRAX 11%/1.5%  . Panic attacks   . Seasonal allergies     SURGICAL HISTORY: Past Surgical History:  Procedure Laterality Date  . ABDOMINAL HYSTERECTOMY  april 1990   TAH BSO  . BACK SURGERY  1996   ruputured disc L-5  . BLADDER SUSPENSION  1982  . BREAST BIOPSY Left   . BREAST LUMPECTOMY WITH RADIOACTIVE SEED AND SENTINEL LYMPH NODE BIOPSY Left 07/05/2020   Procedure: LEFT BREAST LUMPECTOMY WITH RADIOACTIVE SEED AND LEFT AXILLARY SENTINEL LYMPH NODE BIOPSY;  Surgeon: Alphonsa Overall, MD;  Location: [facility];  Service: General;  Laterality: Left;  LEFT BREAST CANCER  . BREAST SURGERY  1984   cyst-benign  . COLONOSCOPY N/A 03/30/2013   Procedure: COLONOSCOPY;  Surgeon: Rogene Houston, MD;  [facility];  Service: Endoscopy;  Laterality:  N/A;  930  . DILATION AND CURETTAGE OF UTERUS  12/09/1988   abnormal uterine bleeding  . IR IMAGING GUIDED PORT INSERTION  08/05/2020    I have reviewed the social history and family history with the patient and they are unchanged from previous note.  ALLERGIES:  is allergic to lipitor [atorvastatin calcium], zocor [simvastatin - high dose], and benadryl [diphenhydramine].  MEDICATIONS:  Current Outpatient Medications  Medication Sig Dispense Refill  . ALPRAZolam (XANAX) 1 MG tablet Take 1 mg by mouth at bedtime as needed for sleep.    Marland Kitchen amLODipine (NORVASC) 2.5 MG tablet Take 2.5 mg by mouth daily.    . cyclobenzaprine (FLEXERIL) 10 MG tablet TAKE 1 TABLET(10 MG) BY MOUTH THREE TIMES DAILY AS NEEDED FOR MUSCLE SPASMS 30 tablet 0  . fluticasone (FLONASE) 50 MCG/ACT nasal spray Place into both nostrils daily.    Marland Kitchen lidocaine-prilocaine (EMLA) cream Apply to affected area once 30 g 3  . multivitamin (THERAGRAN) per tablet Take 1 tablet by mouth daily.      . naproxen sodium (ALEVE) 220 MG tablet Take 220 mg by mouth.    . nitrofurantoin, macrocrystal-monohydrate, (MACROBID) 100 MG capsule Take 1 capsule (100 mg total) by  mouth 2 (two) times daily. 10 capsule 0  . ondansetron (ZOFRAN) 8 MG tablet Take 1 tablet (8 mg total) by mouth 2 (two) times daily as needed (Nausea or vomiting). 30 tablet 1  . pravastatin (PRAVACHOL) 10 MG tablet Take 10 mg by mouth daily.      . prochlorperazine (COMPAZINE) 10 MG tablet Take 1 tablet (10 mg total) by mouth every 6 (six) hours as needed (Nausea or vomiting). 30 tablet 1  . Pyridoxine HCl (VITAMIN B-6 PO) Take 1 tablet by mouth daily.     . sertraline (ZOLOFT) 50 MG tablet Take 50 mg by mouth daily.    Marland Kitchen sulfamethoxazole-trimethoprim (BACTRIM DS) 800-160 MG tablet Take 1 tablet by mouth 2 (two) times daily.    . traMADol (ULTRAM) 50 MG tablet Take 1 tablet (50 mg total) by mouth every 6 (six) hours as needed for moderate pain or severe pain. 12 tablet 0    No current facility-administered medications for this visit.    PHYSICAL EXAMINATION: ECOG PERFORMANCE STATUS: 2 - Symptomatic, <50% confined to bed  Vitals:   10/04/20 0856  BP: (!) 158/61  Pulse: 66  Resp: 20  Temp: (!) 97.3 F (36.3 C)  SpO2: 100%   Filed Weights   10/04/20 0856  Weight: 135 lb 8 oz (61.5 kg)    Due to COVID19 we will limit examination to appearance. Patient had no complaints.  GENERAL:alert, no distress and comfortable SKIN: skin color normal, no rashes or significant lesions EYES: normal, Conjunctiva are pink and non-injected, sclera clear  NEURO: alert & oriented x 3 with fluent speech  LABORATORY DATA:  I have reviewed the data as listed CBC Latest Ref Rng & Units 10/04/2020 09/27/2020 09/20/2020  WBC 4.0 - 10.5 K/uL 4.6 5.1 5.7  Hemoglobin 12.0 - 15.0 g/dL 11.1(L) 10.9(L) 11.6(L)  Hematocrit 36 - 46 % 33.1(L) 33.5(L) 34.7(L)  Platelets 150 - 400 K/uL 393 373 365     CMP Latest Ref Rng & Units 10/04/2020 09/27/2020 09/20/2020  Glucose 70 - 99 mg/dL 102(H) 122(H) 111(H)  BUN 8 - 23 mg/dL '12 12 13  ' Creatinine 0.44 - 1.00 mg/dL 0.70 0.71 0.73  Sodium 135 - 145 mmol/L 135 136 137  Potassium 3.5 - 5.1 mmol/L 4.4 4.5 4.1  Chloride 98 - 111 mmol/L 105 107 105  CO2 22 - 32 mmol/L 23 19(L) 23  Calcium 8.9 - 10.3 mg/dL 9.2 9.0 9.3  Total Protein 6.5 - 8.1 g/dL 7.4 7.1 7.5  Total Bilirubin 0.3 - 1.2 mg/dL 0.4 0.3 0.4  Alkaline Phos 38 - 126 U/L 42 40 40  AST 15 - 41 U/L '29 22 25  ' ALT 0 - 44 U/L '8 8 9      ' RADIOGRAPHIC STUDIES: I have personally reviewed the radiological images as listed and agreed with the findings in the report. No results found.   ASSESSMENT & PLAN:  Doris Ellis is a 79 y.o. female with   1.Left breastcancer, grade 1-2, ER+/PR-/HER2- Ki-67 5%,pT1cN0M0, Grade 1,stage IA, RS 33, -She was diagnosed in 06/2020 with grade 1-2 invasive ductal carcinoma of the left breast.  -She underwent left lumpectomy with SLNB with Dr  Lucia Gaskins on 07/05/20.Her surgicalpathology showeda 1.8 cm tumor with 1negativelymph node. Surgical margins were negative.HerOncotype Dx testingshowed high risk disease with recurrence score of 33. -Given high risk disease I recommended adjuvant chemo to reduce her risk of recurrence. She started weeklypaclitaxel for 12 weeks on10/8/21.Given mild reaction with first dose I changedpremedication dexa  to Solu-Medrol 125 mg (now on 32m due to insomnia), and increase famotidine to 40 mg -S/p week 8 she continues to tolerate treatment well. She has mild fatigue, no neuropathy or nausea. Labs reviewed, Hg 11.1, overall adequate to proceed with week 9 Taxol today. Continue weekly chemo until 10/24/20.  -After chemo she will proceed with Radiation. Given her ER positive disease she is eligible for antiestrogen therapy, will proceed after RT.   -F/u in 2 weeks    2.Osteopenia -DEXA on 04/01/2017 shows osteopenia in the right femur neck with T score -1.6.Her FRAX score shows a 16.9% probability of a major osteoporotic fracture in 10 years and 3.1% risk for hip fracture -We encouraged her to begin calcium and vitamin D once daily and continue weightbearing exercise -Will repeatDEXAafter chemo   PLAN: -Lab reviewed, adequate to proceed with week 9 paclitaxel today at same dose.  -lab, flush, Taxol in 1, 2, 3 weeks  -Proceed with Radiation with Dr. MLisbeth Renshawafter chemo,will refer her back to be seen next month  F/u in 2 weeks    No problem-specific Assessment & Plan notes found for this encounter.   No orders of the defined types were placed in this encounter.  All questions were answered. The patient knows to call the clinic with any problems, questions or concerns. No barriers to learning was detected. The total time spent in the appointment was 30 minutes.     YTruitt Merle MD 10/04/2020   I, AJoslyn Devon am acting as scribe for YTruitt Merle MD.   I have reviewed the above  documentation for accuracy and completeness, and I agree with the above.

## 2020-10-04 ENCOUNTER — Inpatient Hospital Stay: Payer: Medicare Other

## 2020-10-04 ENCOUNTER — Inpatient Hospital Stay: Payer: Medicare Other | Attending: Nurse Practitioner

## 2020-10-04 ENCOUNTER — Other Ambulatory Visit: Payer: Self-pay

## 2020-10-04 ENCOUNTER — Encounter: Payer: Self-pay | Admitting: Hematology

## 2020-10-04 ENCOUNTER — Inpatient Hospital Stay: Payer: Medicare Other | Admitting: Hematology

## 2020-10-04 ENCOUNTER — Encounter: Payer: Self-pay | Admitting: Medical Oncology

## 2020-10-04 VITALS — BP 158/61 | HR 66 | Temp 97.3°F | Resp 20 | Ht 64.0 in | Wt 135.5 lb

## 2020-10-04 DIAGNOSIS — Z170 Estrogen receptor positive status [ER+]: Secondary | ICD-10-CM

## 2020-10-04 DIAGNOSIS — M8580 Other specified disorders of bone density and structure, unspecified site: Secondary | ICD-10-CM | POA: Insufficient documentation

## 2020-10-04 DIAGNOSIS — Z5111 Encounter for antineoplastic chemotherapy: Secondary | ICD-10-CM | POA: Insufficient documentation

## 2020-10-04 DIAGNOSIS — C50212 Malignant neoplasm of upper-inner quadrant of left female breast: Secondary | ICD-10-CM | POA: Insufficient documentation

## 2020-10-04 DIAGNOSIS — F418 Other specified anxiety disorders: Secondary | ICD-10-CM | POA: Insufficient documentation

## 2020-10-04 DIAGNOSIS — Z79899 Other long term (current) drug therapy: Secondary | ICD-10-CM | POA: Diagnosis not present

## 2020-10-04 DIAGNOSIS — E785 Hyperlipidemia, unspecified: Secondary | ICD-10-CM | POA: Diagnosis not present

## 2020-10-04 DIAGNOSIS — I10 Essential (primary) hypertension: Secondary | ICD-10-CM | POA: Insufficient documentation

## 2020-10-04 DIAGNOSIS — Z95828 Presence of other vascular implants and grafts: Secondary | ICD-10-CM

## 2020-10-04 LAB — CBC WITH DIFFERENTIAL/PLATELET
Abs Immature Granulocytes: 0.04 10*3/uL (ref 0.00–0.07)
Basophils Absolute: 0.1 10*3/uL (ref 0.0–0.1)
Basophils Relative: 2 %
Eosinophils Absolute: 0.1 10*3/uL (ref 0.0–0.5)
Eosinophils Relative: 2 %
HCT: 33.1 % — ABNORMAL LOW (ref 36.0–46.0)
Hemoglobin: 11.1 g/dL — ABNORMAL LOW (ref 12.0–15.0)
Immature Granulocytes: 1 %
Lymphocytes Relative: 30 %
Lymphs Abs: 1.4 10*3/uL (ref 0.7–4.0)
MCH: 30.9 pg (ref 26.0–34.0)
MCHC: 33.5 g/dL (ref 30.0–36.0)
MCV: 92.2 fL (ref 80.0–100.0)
Monocytes Absolute: 0.3 10*3/uL (ref 0.1–1.0)
Monocytes Relative: 7 %
Neutro Abs: 2.7 10*3/uL (ref 1.7–7.7)
Neutrophils Relative %: 58 %
Platelets: 393 10*3/uL (ref 150–400)
RBC: 3.59 MIL/uL — ABNORMAL LOW (ref 3.87–5.11)
RDW: 15.9 % — ABNORMAL HIGH (ref 11.5–15.5)
WBC: 4.6 10*3/uL (ref 4.0–10.5)
nRBC: 0 % (ref 0.0–0.2)

## 2020-10-04 LAB — COMPREHENSIVE METABOLIC PANEL
ALT: 8 U/L (ref 0–44)
AST: 29 U/L (ref 15–41)
Albumin: 3.6 g/dL (ref 3.5–5.0)
Alkaline Phosphatase: 42 U/L (ref 38–126)
Anion gap: 7 (ref 5–15)
BUN: 12 mg/dL (ref 8–23)
CO2: 23 mmol/L (ref 22–32)
Calcium: 9.2 mg/dL (ref 8.9–10.3)
Chloride: 105 mmol/L (ref 98–111)
Creatinine, Ser: 0.7 mg/dL (ref 0.44–1.00)
GFR, Estimated: >60 mL/min (ref >60)
Glucose, Bld: 102 mg/dL — ABNORMAL HIGH (ref 70–99)
Potassium: 4.4 mmol/L (ref 3.5–5.1)
Sodium: 135 mmol/L (ref 135–145)
Total Bilirubin: 0.4 mg/dL (ref 0.3–1.2)
Total Protein: 7.4 g/dL (ref 6.5–8.1)

## 2020-10-04 MED ORDER — LORATADINE 10 MG PO TABS
10.0000 mg | ORAL_TABLET | Freq: Every day | ORAL | Status: DC
  Administered 2020-10-04: 10 mg via ORAL

## 2020-10-04 MED ORDER — HEPARIN SOD (PORK) LOCK FLUSH 100 UNIT/ML IV SOLN
500.0000 [IU] | Freq: Once | INTRAVENOUS | Status: AC | PRN
  Administered 2020-10-04: 500 [IU]
  Filled 2020-10-04: qty 5

## 2020-10-04 MED ORDER — SODIUM CHLORIDE 0.9 % IV SOLN
40.0000 mg | Freq: Once | INTRAVENOUS | Status: AC
  Administered 2020-10-04: 40 mg via INTRAVENOUS
  Filled 2020-10-04: qty 4

## 2020-10-04 MED ORDER — SODIUM CHLORIDE 0.9 % IV SOLN
Freq: Once | INTRAVENOUS | Status: AC
  Filled 2020-10-04: qty 250

## 2020-10-04 MED ORDER — SODIUM CHLORIDE 0.9 % IV SOLN
80.0000 mg/m2 | Freq: Once | INTRAVENOUS | Status: AC
  Administered 2020-10-04: 138 mg via INTRAVENOUS
  Filled 2020-10-04: qty 23

## 2020-10-04 MED ORDER — SODIUM CHLORIDE 0.9% FLUSH
10.0000 mL | INTRAVENOUS | Status: DC | PRN
  Administered 2020-10-04: 10 mL
  Filled 2020-10-04: qty 10

## 2020-10-04 MED ORDER — METHYLPREDNISOLONE SODIUM SUCC 125 MG IJ SOLR
80.0000 mg | Freq: Every day | INTRAMUSCULAR | Status: AC
  Administered 2020-10-04: 80 mg via INTRAVENOUS

## 2020-10-04 MED ORDER — SODIUM CHLORIDE 0.9% FLUSH
10.0000 mL | Freq: Once | INTRAVENOUS | Status: AC
  Administered 2020-10-04: 10 mL
  Filled 2020-10-04: qty 10

## 2020-10-04 MED ORDER — METHYLPREDNISOLONE SODIUM SUCC 125 MG IJ SOLR
INTRAMUSCULAR | Status: AC
  Filled 2020-10-04: qty 2

## 2020-10-04 MED ORDER — LORATADINE 10 MG PO TABS
ORAL_TABLET | ORAL | Status: AC
  Filled 2020-10-04: qty 1

## 2020-10-04 NOTE — Progress Notes (Signed)
N1550, A PROSPECTIVE OBSERVATIONAL COHORT STUDY TO DEVELOP A PREDICTIVE MODEL OF TAXANE-INDUCED PERIPHERAL NEUROPATHY IN CANCER PATIENTS. Week 8 assessments.  Patient presented to the clinic, alone. Patient was met with before her appointments and provided the study questionnaires to complete. Patient reports to be feeling well and denies any new onset of neuropathy or decreased sensation to either hands or feet.    PROs: Questionnaires were given to patient to complete in clinic upon her arrival. Collected questionnaires and checked for completeness and accuracy.  Labs: Patient did not consent to optional research labs.  Physician Assessments: CTCAE and Treatment Burden forms reviewed and completed by Dr. Burr Medico, signed and dated.  History of Falls: No assessment required at this time point.  Supplements, Topical Agents and other treatments: Reviewed with patient, she denies any changes from baseline.  Assessment for Interventions for CIPN: Reviewed with patient and CRFs completed. Neuropen Assessment: Completed per protocol by this certified research RN, time recording completed by Carol Ada, [place]. [place] Assessment: Completed per protocol by this Film/video editor, time recording completed by Carol Ada, [place]. Timed Get Up and Go Test: No assessment required at this time point.  Plan: Informed patient of next study assessments in approximately 4 weeks for the 12 weeks assessment. Plan for this visit to be approximately , when patient is in clinic for a scheduled treamtnet.  Patient denied having any questions at this time. Patient thanked for her time and continued support of tudy and was encouraged to call clinic for any questions or concerns she may have prior to her next appointment.  Maxwell Marion, RN, BSN, [facility] Clinical Research 10/04/2020 9:49 AM

## 2020-10-04 NOTE — Patient Instructions (Signed)
[facility] Discharge Instructions for Patients Receiving Chemotherapy  Today you received the following chemotherapy agents taxol  To help prevent nausea and vomiting after your treatment, we encourage you to take your nausea medication as directed   If you develop nausea and vomiting that is not controlled by your nausea medication, call the clinic.   BELOW ARE SYMPTOMS THAT SHOULD BE REPORTED IMMEDIATELY:  *FEVER GREATER THAN 100.5 F  *CHILLS WITH OR WITHOUT FEVER  NAUSEA AND VOMITING THAT IS NOT CONTROLLED WITH YOUR NAUSEA MEDICATION  *UNUSUAL SHORTNESS OF BREATH  *UNUSUAL BRUISING OR BLEEDING  TENDERNESS IN MOUTH AND THROAT WITH OR WITHOUT PRESENCE OF ULCERS  *URINARY PROBLEMS  *BOWEL PROBLEMS  UNUSUAL RASH Items with * indicate a potential emergency and should be followed up as soon as possible.  Feel free to call the clinic should you have any questions or concerns. The clinic phone number is [phone].  Please show the CHEMO ALERT CARD at check-in to the Emergency Department and triage nurse.   

## 2020-10-04 NOTE — Progress Notes (Signed)
Patient stable at discharge. Ambulated independently to exit with belongings.

## 2020-10-04 NOTE — Patient Instructions (Signed)

## 2020-10-07 ENCOUNTER — Encounter: Payer: Self-pay | Admitting: *Deleted

## 2020-10-07 ENCOUNTER — Other Ambulatory Visit: Payer: Self-pay | Admitting: *Deleted

## 2020-10-07 ENCOUNTER — Telehealth: Payer: Self-pay | Admitting: Hematology

## 2020-10-07 DIAGNOSIS — Z170 Estrogen receptor positive status [ER+]: Secondary | ICD-10-CM

## 2020-10-07 NOTE — Telephone Encounter (Signed)
No 12/3 los. No changes made to pt's schedule.

## 2020-10-11 ENCOUNTER — Other Ambulatory Visit: Payer: Self-pay

## 2020-10-11 ENCOUNTER — Inpatient Hospital Stay: Payer: Medicare Other

## 2020-10-11 VITALS — BP 123/64 | HR 64 | Temp 98.4°F | Resp 16

## 2020-10-11 DIAGNOSIS — Z5111 Encounter for antineoplastic chemotherapy: Secondary | ICD-10-CM | POA: Diagnosis not present

## 2020-10-11 DIAGNOSIS — Z170 Estrogen receptor positive status [ER+]: Secondary | ICD-10-CM

## 2020-10-11 DIAGNOSIS — Z95828 Presence of other vascular implants and grafts: Secondary | ICD-10-CM

## 2020-10-11 DIAGNOSIS — E785 Hyperlipidemia, unspecified: Secondary | ICD-10-CM | POA: Diagnosis not present

## 2020-10-11 DIAGNOSIS — I10 Essential (primary) hypertension: Secondary | ICD-10-CM | POA: Diagnosis not present

## 2020-10-11 DIAGNOSIS — C50212 Malignant neoplasm of upper-inner quadrant of left female breast: Secondary | ICD-10-CM

## 2020-10-11 DIAGNOSIS — M8580 Other specified disorders of bone density and structure, unspecified site: Secondary | ICD-10-CM | POA: Diagnosis not present

## 2020-10-11 DIAGNOSIS — Z79899 Other long term (current) drug therapy: Secondary | ICD-10-CM | POA: Diagnosis not present

## 2020-10-11 LAB — CBC WITH DIFFERENTIAL/PLATELET
Abs Immature Granulocytes: 0.06 10*3/uL (ref 0.00–0.07)
Basophils Absolute: 0.1 10*3/uL (ref 0.0–0.1)
Basophils Relative: 2 %
Eosinophils Absolute: 0.1 10*3/uL (ref 0.0–0.5)
Eosinophils Relative: 2 %
HCT: 34.1 % — ABNORMAL LOW (ref 36.0–46.0)
Hemoglobin: 11.2 g/dL — ABNORMAL LOW (ref 12.0–15.0)
Immature Granulocytes: 1 %
Lymphocytes Relative: 25 %
Lymphs Abs: 1.3 10*3/uL (ref 0.7–4.0)
MCH: 30.9 pg (ref 26.0–34.0)
MCHC: 32.8 g/dL (ref 30.0–36.0)
MCV: 93.9 fL (ref 80.0–100.0)
Monocytes Absolute: 0.4 10*3/uL (ref 0.1–1.0)
Monocytes Relative: 8 %
Neutro Abs: 3.2 10*3/uL (ref 1.7–7.7)
Neutrophils Relative %: 62 %
Platelets: 365 10*3/uL (ref 150–400)
RBC: 3.63 MIL/uL — ABNORMAL LOW (ref 3.87–5.11)
RDW: 15.9 % — ABNORMAL HIGH (ref 11.5–15.5)
WBC: 5.2 10*3/uL (ref 4.0–10.5)
nRBC: 0 % (ref 0.0–0.2)

## 2020-10-11 LAB — COMPREHENSIVE METABOLIC PANEL
ALT: 11 U/L (ref 0–44)
AST: 24 U/L (ref 15–41)
Albumin: 3.5 g/dL (ref 3.5–5.0)
Alkaline Phosphatase: 41 U/L (ref 38–126)
Anion gap: 8 (ref 5–15)
BUN: 13 mg/dL (ref 8–23)
CO2: 22 mmol/L (ref 22–32)
Calcium: 9.2 mg/dL (ref 8.9–10.3)
Chloride: 107 mmol/L (ref 98–111)
Creatinine, Ser: 0.74 mg/dL (ref 0.44–1.00)
GFR, Estimated: >60 mL/min (ref >60)
Glucose, Bld: 108 mg/dL — ABNORMAL HIGH (ref 70–99)
Potassium: 4.2 mmol/L (ref 3.5–5.1)
Sodium: 137 mmol/L (ref 135–145)
Total Bilirubin: 0.4 mg/dL (ref 0.3–1.2)
Total Protein: 7.2 g/dL (ref 6.5–8.1)

## 2020-10-11 MED ORDER — SODIUM CHLORIDE 0.9% FLUSH
10.0000 mL | INTRAVENOUS | Status: DC | PRN
  Administered 2020-10-11: 10 mL
  Filled 2020-10-11: qty 10

## 2020-10-11 MED ORDER — SODIUM CHLORIDE 0.9 % IV SOLN
Freq: Once | INTRAVENOUS | Status: DC
  Filled 2020-10-11: qty 250

## 2020-10-11 MED ORDER — SODIUM CHLORIDE 0.9% FLUSH
10.0000 mL | Freq: Once | INTRAVENOUS | Status: AC
  Administered 2020-10-11: 10 mL
  Filled 2020-10-11: qty 10

## 2020-10-11 MED ORDER — SODIUM CHLORIDE 0.9 % IV SOLN
40.0000 mg | Freq: Once | INTRAVENOUS | Status: AC
  Administered 2020-10-11: 40 mg via INTRAVENOUS
  Filled 2020-10-11: qty 4

## 2020-10-11 MED ORDER — SODIUM CHLORIDE 0.9 % IV SOLN
80.0000 mg/m2 | Freq: Once | INTRAVENOUS | Status: AC
  Administered 2020-10-11: 138 mg via INTRAVENOUS
  Filled 2020-10-11: qty 23

## 2020-10-11 MED ORDER — LORATADINE 10 MG PO TABS
ORAL_TABLET | ORAL | Status: AC
  Filled 2020-10-11: qty 1

## 2020-10-11 MED ORDER — LORATADINE 10 MG PO TABS
10.0000 mg | ORAL_TABLET | Freq: Every day | ORAL | Status: DC
  Administered 2020-10-11: 10 mg via ORAL

## 2020-10-11 MED ORDER — HEPARIN SOD (PORK) LOCK FLUSH 100 UNIT/ML IV SOLN
500.0000 [IU] | Freq: Once | INTRAVENOUS | Status: AC | PRN
  Administered 2020-10-11: 500 [IU]
  Filled 2020-10-11: qty 5

## 2020-10-11 MED ORDER — METHYLPREDNISOLONE SODIUM SUCC 125 MG IJ SOLR
INTRAMUSCULAR | Status: AC
  Filled 2020-10-11: qty 2

## 2020-10-11 MED ORDER — METHYLPREDNISOLONE SODIUM SUCC 125 MG IJ SOLR
80.0000 mg | Freq: Every day | INTRAMUSCULAR | Status: AC
  Administered 2020-10-11: 80 mg via INTRAVENOUS

## 2020-10-11 NOTE — Progress Notes (Signed)
Pt tolerated treatment well.  No complaints.  Stable at discharge.  Ambulatory to lobby.

## 2020-10-11 NOTE — Patient Instructions (Signed)
[facility] Discharge Instructions for Patients Receiving Chemotherapy  Today you received the following chemotherapy agents taxol  To help prevent nausea and vomiting after your treatment, we encourage you to take your nausea medication as directed   If you develop nausea and vomiting that is not controlled by your nausea medication, call the clinic.   BELOW ARE SYMPTOMS THAT SHOULD BE REPORTED IMMEDIATELY:  *FEVER GREATER THAN 100.5 F  *CHILLS WITH OR WITHOUT FEVER  NAUSEA AND VOMITING THAT IS NOT CONTROLLED WITH YOUR NAUSEA MEDICATION  *UNUSUAL SHORTNESS OF BREATH  *UNUSUAL BRUISING OR BLEEDING  TENDERNESS IN MOUTH AND THROAT WITH OR WITHOUT PRESENCE OF ULCERS  *URINARY PROBLEMS  *BOWEL PROBLEMS  UNUSUAL RASH Items with * indicate a potential emergency and should be followed up as soon as possible.  Feel free to call the clinic should you have any questions or concerns. The clinic phone number is [phone].  Please show the CHEMO ALERT CARD at check-in to the Emergency Department and triage nurse.   

## 2020-10-11 NOTE — Progress Notes (Signed)
[facility] OFFICE PROGRESS NOTE  Asencion Noble, MD [address] [place] [address]  DIAGNOSIS: F/u of left breast cancer  Oncology History Overview Note  Cancer Staging Malignant neoplasm of upper-inner quadrant of female breast [facility]) Staging form: Breast, AJCC 8th Edition - Clinical stage from 06/11/2020: Stage IA (cT1b, cN0, cM0, G2, ER+, PR-, HER2: Equivocal) - Signed by Alla Feeling, NP on 07/01/2020 - Pathologic stage from 07/05/2020: Stage IA (pT1c, pN0, cM0, G2, ER+, PR-, HER2-, Oncotype DX score: 33) - Signed by Truitt Merle, MD on 07/29/2020    Malignant neoplasm of upper-inner quadrant of female breast ([place])  05/28/2020 Mammogram   Diagnostic left mammogram/US  -On physical exam, I palpate discrete focal soft thickening in the 10-11 o'clock location of the LEFT breast. -Targeted ultrasound is performed, showing an irregular taller than wide hypoechoic mass with indistinct margins in the 10 o'clock location of the LEFT breast 8 centimeters from the nipple. There is associated posterior acoustic shadowing. No significant internal blood flow identified on Doppler evaluation.  IMPRESSION: Suspicious mass in the 10 o'clock location of the LEFT breast. No LEFT axillary adenopathy.     06/11/2020 Cancer Staging   Staging form: Breast, AJCC 8th Edition - Clinical stage from 06/11/2020: Stage IA (cT1b, cN0, cM0, G2, ER+, PR-, HER2: Equivocal) - Signed by Alla Feeling, NP on 07/01/2020   06/11/2020 Initial Biopsy   FINAL MICROSCOPIC DIAGNOSIS: A. BREAST, 10:00, LEFT, BIOPSY: - Invasive ductal carcinoma The carcinoma appears Nottingham grade 1-2 of 3 and measures 0.9 cm in greatest linear extent  PROGNOSTIC INDICATOR RESULTS: The tumor cells are EQUIVOCAL for Her2 (2+) Estrogen Receptor: 90%, MODERATE STAINING INTENSITY Progesterone Receptor: NEGATIVE Proliferation Marker Ki-67: 5%  FLOURESCENCE IN-SITU HYBRIDIZATION RESULTS: GROUP 5: HER2  **NEGATIVE** RATIO of HER2/CEN 17 SIGNALS: 1.18 AVERAGE HER2 COPY NUMBER PER CELL: 1.30   07/01/2020 Initial Diagnosis   Malignant neoplasm of upper-inner quadrant of female breast ([place])   07/05/2020 Surgery   LEFT BREAST LUMPECTOMY WITH RADIOACTIVE SEED AND LEFT AXILLARY SENTINEL LYMPH NODE BIOPSY by Dr Lucia Gaskins    07/05/2020 Pathology Results   FINAL MICROSCOPIC DIAGNOSIS:   A. BREAST, LEFT, LUMPECTOMY:  - Invasive ductal carcinoma, 1.8 cm.  - Margins not involved.  - Biopsy site and biopsy clip.  - See oncology table.   B. LYMPH NODE, LEFT AXILLARY, SENTINEL, EXCISION:  - One lymph node with no metastatic carcinoma (0/1).    07/05/2020 Oncotype testing   Oncotype  Recurrence Score 33 Distant recurrence risk at 9 years with Tamoxifen alone is 21% There is >15% benefit of adjuvant chemotherapy.    07/05/2020 Cancer Staging   Staging form: Breast, AJCC 8th Edition - Pathologic stage from 07/05/2020: Stage IA (pT1c, pN0, cM0, G2, ER+, PR-, HER2-, Oncotype DX score: 33) - Signed by Truitt Merle, MD on 07/29/2020   08/09/2020 -  Chemotherapy   Weekly Taxol for 12 weeks starting 08/09/20      CURRENT THERAPY: Weekly paclitaxel 80 mg/m2. She is here for week 11 of 12 today.   INTERVAL HISTORY: Doris Ellis 79 y.o. female returns to the clinic today for a follow up visit. She has been tolerating her treatment well without any adverse side effects. Her only concern is some continued nasal congestion without any associated fevers, chills, sore throat, headaches, loss of taste/small, cough, or shortness of breath. She attributes this to allergies and she has been using flonase, nasal lavage, and humidifiers.  She denies weight loss.  Denies any chest pain or hemoptysis. Denies any nausea, vomiting, diarrhea, or constipation. She denies peripheral neuropathy. Denies any tingling, burning, numbness, cold sensation, or itching in her hands or feet. She reports her usual mild baseline numbness in the lateral  part of her left foot from a herniated disk years ago. Denies weakness or muscle twitching. Denies any changes in her motor tasks including writing, buttoning clothing, walking, driving, etc. Her energy waxes and wanes. and appetite is good.The patient is here today for evaluation prior to starting cycle #11.  MEDICAL HISTORY: Past Medical History:  Diagnosis Date  . Anxiety   . Arthritis   . Cancer ([place]) 05/2020   left breast IDC  . Concussion 1985  . Depression   . Hyperlipidemia   . Hypertension   . Mild concussion 2002   tripped over bag at work  . Osteopenia 07/2011   T score -1.8 FRAX 11%/1.5%  . Panic attacks   . Seasonal allergies     ALLERGIES:  is allergic to lipitor [atorvastatin calcium], zocor [simvastatin - high dose], and benadryl [diphenhydramine].  MEDICATIONS:  Current Outpatient Medications  Medication Sig Dispense Refill  . ALPRAZolam (XANAX) 1 MG tablet Take 1 mg by mouth at bedtime as needed for sleep.    Marland Kitchen amLODipine (NORVASC) 2.5 MG tablet Take 2.5 mg by mouth daily.    . cyclobenzaprine (FLEXERIL) 10 MG tablet TAKE 1 TABLET(10 MG) BY MOUTH THREE TIMES DAILY AS NEEDED FOR MUSCLE SPASMS 30 tablet 0  . fluticasone (FLONASE) 50 MCG/ACT nasal spray Place into both nostrils daily.    Marland Kitchen lidocaine-prilocaine (EMLA) cream Apply to affected area once 30 g 3  . multivitamin (THERAGRAN) per tablet Take 1 tablet by mouth daily.    . naproxen sodium (ALEVE) 220 MG tablet Take 220 mg by mouth.    . ondansetron (ZOFRAN) 8 MG tablet Take 1 tablet (8 mg total) by mouth 2 (two) times daily as needed (Nausea or vomiting). 30 tablet 1  . pravastatin (PRAVACHOL) 10 MG tablet Take 10 mg by mouth daily.    . prochlorperazine (COMPAZINE) 10 MG tablet Take 1 tablet (10 mg total) by mouth every 6 (six) hours as needed (Nausea or vomiting). 30 tablet 1  . Pyridoxine HCl (VITAMIN B-6 PO) Take 1 tablet by mouth daily.     . sertraline (ZOLOFT) 50 MG tablet Take 50 mg by mouth daily.     . traMADol (ULTRAM) 50 MG tablet Take 1 tablet (50 mg total) by mouth every 6 (six) hours as needed for moderate pain or severe pain. 12 tablet 0   No current facility-administered medications for this visit.   Facility-Administered Medications Ordered in Other Visits  Medication Dose Route Frequency Provider Last Rate Last Admin  . 0.9 %  sodium chloride infusion   Intravenous Once Truitt Merle, MD      . famotidine (PEPCID) 40 mg in sodium chloride 0.9 % 50 mL IVPB  40 mg Intravenous Once Truitt Merle, MD      . loratadine (CLARITIN) tablet 10 mg  10 mg Oral Daily Truitt Merle, MD      . methylPREDNISolone sodium succinate (SOLU-MEDROL) 125 mg/2 mL injection 80 mg  80 mg Intravenous Daily Truitt Merle, MD      . PACLitaxel (TAXOL) 138 mg in sodium chloride 0.9 % 250 mL chemo infusion (</= 107m/m2)  80 mg/m2 (Treatment Plan Recorded) Intravenous Once FTruitt Merle MD        SURGICAL HISTORY:  Past Surgical  History:  Procedure Laterality Date  . ABDOMINAL HYSTERECTOMY  april 1990   TAH BSO  . BACK SURGERY  1996   ruputured disc L-5  . BLADDER SUSPENSION  1982  . BREAST BIOPSY Left   . BREAST LUMPECTOMY WITH RADIOACTIVE SEED AND SENTINEL LYMPH NODE BIOPSY Left 07/05/2020   Procedure: LEFT BREAST LUMPECTOMY WITH RADIOACTIVE SEED AND LEFT AXILLARY SENTINEL LYMPH NODE BIOPSY;  Surgeon: Alphonsa Overall, MD;  Location: [facility];  Service: General;  Laterality: Left;  LEFT BREAST CANCER  . BREAST SURGERY  1984   cyst-benign  . COLONOSCOPY N/A 03/30/2013   Procedure: COLONOSCOPY;  Surgeon: Rogene Houston, MD;  [facility];  Service: Endoscopy;  Laterality: N/A;  930  . DILATION AND CURETTAGE OF UTERUS  12/09/1988   abnormal uterine bleeding  . IR IMAGING GUIDED PORT INSERTION  08/05/2020    REVIEW OF SYSTEMS:   Review of Systems  Constitutional: Negative for appetite change, chills, fatigue, fever and unexpected weight change.  HENT: Positive for nasal congestion. Negative  for mouth sores, nosebleeds, sore throat and trouble swallowing.   Eyes: Negative for eye problems and icterus.  Respiratory: Negative for cough, hemoptysis, shortness of breath and wheezing.   Cardiovascular: Negative for chest pain and leg swelling.  Gastrointestinal: Negative for abdominal pain, constipation, diarrhea, nausea and vomiting.  Genitourinary: Negative for bladder incontinence, difficulty urinating, dysuria, frequency and hematuria.   Musculoskeletal: Negative for back pain, gait problem, neck pain and neck stiffness.  Skin: Negative for itching and rash.  Neurological: Negative for dizziness, extremity weakness, gait problem, headaches, light-headedness and seizures.  Hematological: Negative for adenopathy. Does not bruise/bleed easily.  Psychiatric/Behavioral: Negative for confusion, depression and sleep disturbance. The patient is not nervous/anxious.     PHYSICAL EXAMINATION:  Blood pressure (!) 138/58, pulse 70, temperature 98 F (36.7 C), temperature source Tympanic, resp. rate 12, height _0  (1.626 m), weight 137 lb 14.4 oz (62.6 kg), last menstrual period 03/05/1989, SpO2 100 %.  ECOG PERFORMANCE STATUS: 1 - Symptomatic but completely ambulatory  Physical Exam  Constitutional: Oriented to person, place, and time and well-developed, well-nourished, and in no distress.  HENT:  Head: Normocephalic and atraumatic.  Mouth/Throat: Oropharynx is clear and moist. No oropharyngeal exudate.  Eyes: Conjunctivae are normal. Right eye exhibits no discharge. Left eye exhibits no discharge. No scleral icterus.  Neck: Normal range of motion. Neck supple.  Cardiovascular: Normal rate, regular rhythm, normal heart sounds and intact distal pulses.   Pulmonary/Chest: Effort normal and breath sounds normal. No respiratory distress. No wheezes. No rales.  Abdominal: Soft. Bowel sounds are normal. Exhibits no distension and no mass. There is no tenderness.  Musculoskeletal: Normal  range of motion. Exhibits no edema.  Lymphadenopathy:    No cervical adenopathy.  Neurological: Alert and oriented to person, place, and time. Exhibits normal muscle tone. Gait normal. Coordination normal. No weakness in her hands or feet. Able to feel vibratory sensation in all of her fingers and toes with tuning fork. Able to feel cotton swab sensation in all fingers/toes and sensation equal and symmetric.  Skin: Skin is warm and dry. No rash noted. Not diaphoretic. No erythema. No pallor.  Psychiatric: Mood, memory and judgment normal.  Vitals reviewed.  LABORATORY DATA: Lab Results  Component Value Date   WBC 4.8 10/18/2020   HGB 10.8 (L) 10/18/2020   HCT 32.2 (L) 10/18/2020   MCV 92.0 10/18/2020   PLT 356 10/18/2020  Chemistry      Component Value Date/Time   NA 132 (L) 10/18/2020 1014   K 4.2 10/18/2020 1014   CL 101 10/18/2020 1014   CO2 22 10/18/2020 1014   BUN 12 10/18/2020 1014   CREATININE 0.70 10/18/2020 1014      Component Value Date/Time   CALCIUM 9.5 10/18/2020 1014   ALKPHOS 40 10/18/2020 1014   AST 32 10/18/2020 1014   ALT 12 10/18/2020 1014   BILITOT 0.4 10/18/2020 1014       RADIOGRAPHIC STUDIES:  No results found.   ASSESSMENT/PLAN:  Doris L Mooreis a 79 y.o.femalewith   1.Left breastcancer, grade 1-2, ER+/PR-/HER2- Ki-67 5%,pT1cN0M0, Grade 1,stage IA, RS 33, -She was diagnosed in 06/2020 with grade 1-2 invasive ductal carcinoma of the left breast.  -She underwent left lumpectomy with SLNB with Dr Lucia Gaskins on 07/05/20.Dr. Burr Medico previouslydiscussed her pathology which showeda 1.8 cm tumor with 1 - lymph nodes. Surgical margins were negative. -Given the early stage disease, she does not need staging scans. -Dr. Burr Medico previouslyalso discussed her Oncotype Dx testing of surgical sample which showedhigh risk disease with recurrence score of 33. The estimated distant recurrence is 21% in the next 9 years. -Dr. Burr Medico previouslydiscussed  adjuvant chemotherapy to reduce her risk of future recurrence, given the high risk disease. Shediscussed the option of ddAC-T, TC and weekly Taxol.She is 79 year old, also functions very well at home, and has good baseline health, she has very limited social support.Dr. Clent Ridges paclitaxel for 12 weeks as her adjuvant chemotherapy. -She is enrolled in the neuropathy study. No neuropathy at this time.  -Dr. Burr Medico previouslyalso discussed the benefit of adjuvant radiation and adjuvant antiestrogen therapy. She has an appointment with radiation oncology on 11/20/19. -The patient is here today (10/18/20) for week 11 of her treatment. Labs were reviewed. Recommend that she proceed with week #11 of 12 today as scheduled.  -She will follow up with [place] after she completes radiation for discussion of anti-estrogen treatment given her ER positive disease.  -I will arrange for a port-flush every 6-8 weeks.   2.Osteopenia -DEXA on 04/01/2017 shows osteopenia in the right femur neck with T score -1.6.Her FRAX score shows a 16.9% probability of a major osteoporotic fracture in 10 years and 3.1% risk for hip fracture -We encouraged her to begin calcium and vitamin D once daily and continue weightbearing exercise -Will repeatDEXAafter chemo   Plan: -Labs reviewed proceed to proceed with week #11 of taxol today as scheduled at the same dose.  -Labs, flush, and taxol in 1 week -F/U after completion of radiation (Dr. Lisbeth Renshaw) to discuss anti-estrogen therapy.       No orders of the defined types were placed in this encounter.    Doris Failla L Ivery Nanney, PA-C 10/18/20

## 2020-10-14 ENCOUNTER — Ambulatory Visit: Payer: Medicare Other | Attending: Surgery

## 2020-10-18 ENCOUNTER — Inpatient Hospital Stay: Payer: Medicare Other

## 2020-10-18 ENCOUNTER — Encounter: Payer: Self-pay | Admitting: Medical Oncology

## 2020-10-18 ENCOUNTER — Inpatient Hospital Stay (HOSPITAL_BASED_OUTPATIENT_CLINIC_OR_DEPARTMENT_OTHER): Payer: Medicare Other | Admitting: Physician Assistant

## 2020-10-18 ENCOUNTER — Other Ambulatory Visit: Payer: Self-pay

## 2020-10-18 ENCOUNTER — Encounter: Payer: Self-pay | Admitting: Physician Assistant

## 2020-10-18 VITALS — BP 138/58 | HR 70 | Temp 98.0°F | Resp 12 | Ht 64.0 in | Wt 137.9 lb

## 2020-10-18 DIAGNOSIS — C50212 Malignant neoplasm of upper-inner quadrant of left female breast: Secondary | ICD-10-CM | POA: Diagnosis not present

## 2020-10-18 DIAGNOSIS — Z95828 Presence of other vascular implants and grafts: Secondary | ICD-10-CM

## 2020-10-18 DIAGNOSIS — Z170 Estrogen receptor positive status [ER+]: Secondary | ICD-10-CM

## 2020-10-18 DIAGNOSIS — Z5111 Encounter for antineoplastic chemotherapy: Secondary | ICD-10-CM | POA: Diagnosis not present

## 2020-10-18 DIAGNOSIS — I10 Essential (primary) hypertension: Secondary | ICD-10-CM | POA: Diagnosis not present

## 2020-10-18 DIAGNOSIS — E785 Hyperlipidemia, unspecified: Secondary | ICD-10-CM | POA: Diagnosis not present

## 2020-10-18 DIAGNOSIS — M8580 Other specified disorders of bone density and structure, unspecified site: Secondary | ICD-10-CM | POA: Diagnosis not present

## 2020-10-18 DIAGNOSIS — Z79899 Other long term (current) drug therapy: Secondary | ICD-10-CM | POA: Diagnosis not present

## 2020-10-18 LAB — COMPREHENSIVE METABOLIC PANEL
ALT: 12 U/L (ref 0–44)
AST: 32 U/L (ref 15–41)
Albumin: 3.5 g/dL (ref 3.5–5.0)
Alkaline Phosphatase: 40 U/L (ref 38–126)
Anion gap: 9 (ref 5–15)
BUN: 12 mg/dL (ref 8–23)
CO2: 22 mmol/L (ref 22–32)
Calcium: 9.5 mg/dL (ref 8.9–10.3)
Chloride: 101 mmol/L (ref 98–111)
Creatinine, Ser: 0.7 mg/dL (ref 0.44–1.00)
GFR, Estimated: >60 mL/min (ref >60)
Glucose, Bld: 107 mg/dL — ABNORMAL HIGH (ref 70–99)
Potassium: 4.2 mmol/L (ref 3.5–5.1)
Sodium: 132 mmol/L — ABNORMAL LOW (ref 135–145)
Total Bilirubin: 0.4 mg/dL (ref 0.3–1.2)
Total Protein: 7.3 g/dL (ref 6.5–8.1)

## 2020-10-18 LAB — CBC WITH DIFFERENTIAL/PLATELET
Abs Immature Granulocytes: 0.04 10*3/uL (ref 0.00–0.07)
Basophils Absolute: 0.1 10*3/uL (ref 0.0–0.1)
Basophils Relative: 2 %
Eosinophils Absolute: 0.1 10*3/uL (ref 0.0–0.5)
Eosinophils Relative: 2 %
HCT: 32.2 % — ABNORMAL LOW (ref 36.0–46.0)
Hemoglobin: 10.8 g/dL — ABNORMAL LOW (ref 12.0–15.0)
Immature Granulocytes: 1 %
Lymphocytes Relative: 30 %
Lymphs Abs: 1.4 10*3/uL (ref 0.7–4.0)
MCH: 30.9 pg (ref 26.0–34.0)
MCHC: 33.5 g/dL (ref 30.0–36.0)
MCV: 92 fL (ref 80.0–100.0)
Monocytes Absolute: 0.5 10*3/uL (ref 0.1–1.0)
Monocytes Relative: 10 %
Neutro Abs: 2.7 10*3/uL (ref 1.7–7.7)
Neutrophils Relative %: 55 %
Platelets: 356 10*3/uL (ref 150–400)
RBC: 3.5 MIL/uL — ABNORMAL LOW (ref 3.87–5.11)
RDW: 16 % — ABNORMAL HIGH (ref 11.5–15.5)
WBC: 4.8 10*3/uL (ref 4.0–10.5)
nRBC: 0 % (ref 0.0–0.2)

## 2020-10-18 MED ORDER — HEPARIN SOD (PORK) LOCK FLUSH 100 UNIT/ML IV SOLN
500.0000 [IU] | Freq: Once | INTRAVENOUS | Status: AC | PRN
  Administered 2020-10-18: 500 [IU]
  Filled 2020-10-18: qty 5

## 2020-10-18 MED ORDER — SODIUM CHLORIDE 0.9 % IV SOLN
80.0000 mg/m2 | Freq: Once | INTRAVENOUS | Status: AC
  Administered 2020-10-18: 13:00:00 138 mg via INTRAVENOUS
  Filled 2020-10-18: qty 23

## 2020-10-18 MED ORDER — SODIUM CHLORIDE 0.9 % IV SOLN
Freq: Once | INTRAVENOUS | Status: AC
  Filled 2020-10-18: qty 250

## 2020-10-18 MED ORDER — LORATADINE 10 MG PO TABS
10.0000 mg | ORAL_TABLET | Freq: Every day | ORAL | Status: DC
  Administered 2020-10-18: 11:00:00 10 mg via ORAL

## 2020-10-18 MED ORDER — FAMOTIDINE IN NACL 20-0.9 MG/50ML-% IV SOLN
INTRAVENOUS | Status: AC
  Filled 2020-10-18: qty 50

## 2020-10-18 MED ORDER — SODIUM CHLORIDE 0.9% FLUSH
10.0000 mL | INTRAVENOUS | Status: DC | PRN
  Administered 2020-10-18: 10 mL
  Filled 2020-10-18: qty 10

## 2020-10-18 MED ORDER — METHYLPREDNISOLONE SODIUM SUCC 125 MG IJ SOLR
80.0000 mg | Freq: Every day | INTRAMUSCULAR | Status: AC
  Administered 2020-10-18: 11:00:00 80 mg via INTRAVENOUS

## 2020-10-18 MED ORDER — SODIUM CHLORIDE 0.9% FLUSH
10.0000 mL | Freq: Once | INTRAVENOUS | Status: AC
  Administered 2020-10-18: 10:00:00 10 mL
  Filled 2020-10-18: qty 10

## 2020-10-18 MED ORDER — METHYLPREDNISOLONE SODIUM SUCC 125 MG IJ SOLR
INTRAMUSCULAR | Status: AC
  Filled 2020-10-18: qty 2

## 2020-10-18 MED ORDER — LORATADINE 10 MG PO TABS
ORAL_TABLET | ORAL | Status: AC
  Filled 2020-10-18: qty 1

## 2020-10-18 MED ORDER — SODIUM CHLORIDE 0.9 % IV SOLN
40.0000 mg | Freq: Once | INTRAVENOUS | Status: AC
Start: 2020-10-18 — End: 2020-10-18
  Administered 2020-10-18: 12:00:00 40 mg via INTRAVENOUS
  Filled 2020-10-18: qty 4

## 2020-10-18 NOTE — Patient Instructions (Signed)
[facility] Discharge Instructions for Patients Receiving Chemotherapy  Today you received the following chemotherapy agents taxol  To help prevent nausea and vomiting after your treatment, we encourage you to take your nausea medication as directed   If you develop nausea and vomiting that is not controlled by your nausea medication, call the clinic.   BELOW ARE SYMPTOMS THAT SHOULD BE REPORTED IMMEDIATELY:  *FEVER GREATER THAN 100.5 F  *CHILLS WITH OR WITHOUT FEVER  NAUSEA AND VOMITING THAT IS NOT CONTROLLED WITH YOUR NAUSEA MEDICATION  *UNUSUAL SHORTNESS OF BREATH  *UNUSUAL BRUISING OR BLEEDING  TENDERNESS IN MOUTH AND THROAT WITH OR WITHOUT PRESENCE OF ULCERS  *URINARY PROBLEMS  *BOWEL PROBLEMS  UNUSUAL RASH Items with * indicate a potential emergency and should be followed up as soon as possible.  Feel free to call the clinic should you have any questions or concerns. The clinic phone number is [phone].  Please show the CHEMO ALERT CARD at check-in to the Emergency Department and triage nurse.   

## 2020-10-18 NOTE — Patient Instructions (Signed)

## 2020-10-18 NOTE — Progress Notes (Signed)
[id number], A PROSPECTIVE OBSERVATIONAL COHORT STUDY TO DEVELOP A PREDICTIVE MODEL OF TAXANE-INDUCED PERIPHERAL NEUROPATHY IN CANCER PATIENTS. Week 12 assessments.  Patient presented to the clinic, alone. Patient was met with before her appointments and provided the study questionnaires to complete. Patient reports to be feeling well and denies any new onset of neuropathy or decreased sensation to either hands or feet.    PROs: Questionnaires were given to patient to complete in clinic upon her arrival. Collected questionnaires and checked for completeness and accuracy.  Labs: Patient did not consent to optional research labs.  Physician Assessments: CTCAE and Treatment Burden forms reviewed and completed by PA Cassandra Heilingoetter and reviewed, signed and dated by Dr. Burr Medico.  History of Falls: No assessment required at this time point.  Supplements, Topical Agents and other treatments: Reviewed with patient, she denies any changes from last study assessment.  Assessment for Interventions for CIPN: Reviewed with patient and CRFs completed. Neuropen Assessment: Completed per protocol by this certified research RN, time recording completed by Clabe Seal, CRC. Tuning Fork Assessment: Completed per protocol by this Film/video editor, time recording completed by Clabe Seal, [place]. Timed Get Up and Go Test: No assessment required at this time point.  Plan: Informed patient of next study assessments in approximately 12 weeks time, to complete her 24 weeks study assessment. Plan for this visit to be before February 21, 2021.  Patient denied having any questions at this time. Patient thanked for her time and continued support of tudy and was encouraged to call clinic for any questions or concerns she may have prior to her next appointment. Maxwell Marion, RN, BSN, [facility] Clinical Research 10/18/2020 9:49 AM

## 2020-10-24 ENCOUNTER — Inpatient Hospital Stay: Payer: Medicare Other

## 2020-10-24 ENCOUNTER — Other Ambulatory Visit: Payer: Self-pay

## 2020-10-24 ENCOUNTER — Encounter: Payer: Self-pay | Admitting: *Deleted

## 2020-10-24 VITALS — BP 138/67 | HR 70 | Temp 97.9°F | Resp 16 | Wt 135.0 lb

## 2020-10-24 DIAGNOSIS — Z95828 Presence of other vascular implants and grafts: Secondary | ICD-10-CM

## 2020-10-24 DIAGNOSIS — Z79899 Other long term (current) drug therapy: Secondary | ICD-10-CM | POA: Diagnosis not present

## 2020-10-24 DIAGNOSIS — C50212 Malignant neoplasm of upper-inner quadrant of left female breast: Secondary | ICD-10-CM | POA: Diagnosis not present

## 2020-10-24 DIAGNOSIS — Z5111 Encounter for antineoplastic chemotherapy: Secondary | ICD-10-CM | POA: Diagnosis not present

## 2020-10-24 DIAGNOSIS — I10 Essential (primary) hypertension: Secondary | ICD-10-CM | POA: Diagnosis not present

## 2020-10-24 DIAGNOSIS — Z170 Estrogen receptor positive status [ER+]: Secondary | ICD-10-CM

## 2020-10-24 DIAGNOSIS — M8580 Other specified disorders of bone density and structure, unspecified site: Secondary | ICD-10-CM | POA: Diagnosis not present

## 2020-10-24 DIAGNOSIS — E785 Hyperlipidemia, unspecified: Secondary | ICD-10-CM | POA: Diagnosis not present

## 2020-10-24 LAB — CBC WITH DIFFERENTIAL/PLATELET
Abs Immature Granulocytes: 0.02 10*3/uL (ref 0.00–0.07)
Basophils Absolute: 0.1 10*3/uL (ref 0.0–0.1)
Basophils Relative: 2 %
Eosinophils Absolute: 0.1 10*3/uL (ref 0.0–0.5)
Eosinophils Relative: 1 %
HCT: 32.9 % — ABNORMAL LOW (ref 36.0–46.0)
Hemoglobin: 11 g/dL — ABNORMAL LOW (ref 12.0–15.0)
Immature Granulocytes: 0 %
Lymphocytes Relative: 25 %
Lymphs Abs: 1.1 10*3/uL (ref 0.7–4.0)
MCH: 31.2 pg (ref 26.0–34.0)
MCHC: 33.4 g/dL (ref 30.0–36.0)
MCV: 93.2 fL (ref 80.0–100.0)
Monocytes Absolute: 0.4 10*3/uL (ref 0.1–1.0)
Monocytes Relative: 8 %
Neutro Abs: 2.9 10*3/uL (ref 1.7–7.7)
Neutrophils Relative %: 64 %
Platelets: 399 10*3/uL (ref 150–400)
RBC: 3.53 MIL/uL — ABNORMAL LOW (ref 3.87–5.11)
RDW: 15.8 % — ABNORMAL HIGH (ref 11.5–15.5)
WBC: 4.6 10*3/uL (ref 4.0–10.5)
nRBC: 0 % (ref 0.0–0.2)

## 2020-10-24 LAB — COMPREHENSIVE METABOLIC PANEL
ALT: 10 U/L (ref 0–44)
AST: 21 U/L (ref 15–41)
Albumin: 3.5 g/dL (ref 3.5–5.0)
Alkaline Phosphatase: 43 U/L (ref 38–126)
Anion gap: 7 (ref 5–15)
BUN: 13 mg/dL (ref 8–23)
CO2: 24 mmol/L (ref 22–32)
Calcium: 9.4 mg/dL (ref 8.9–10.3)
Chloride: 104 mmol/L (ref 98–111)
Creatinine, Ser: 0.71 mg/dL (ref 0.44–1.00)
GFR, Estimated: >60 mL/min (ref >60)
Glucose, Bld: 113 mg/dL — ABNORMAL HIGH (ref 70–99)
Potassium: 4.3 mmol/L (ref 3.5–5.1)
Sodium: 135 mmol/L (ref 135–145)
Total Bilirubin: 0.4 mg/dL (ref 0.3–1.2)
Total Protein: 7.3 g/dL (ref 6.5–8.1)

## 2020-10-24 MED ORDER — LORATADINE 10 MG PO TABS
ORAL_TABLET | ORAL | Status: AC
  Filled 2020-10-24: qty 1

## 2020-10-24 MED ORDER — SODIUM CHLORIDE 0.9% FLUSH
10.0000 mL | INTRAVENOUS | Status: DC | PRN
  Filled 2020-10-24: qty 10

## 2020-10-24 MED ORDER — FAMOTIDINE IN NACL 20-0.9 MG/50ML-% IV SOLN
INTRAVENOUS | Status: AC
  Filled 2020-10-24: qty 50

## 2020-10-24 MED ORDER — SODIUM CHLORIDE 0.9 % IV SOLN
80.0000 mg/m2 | Freq: Once | INTRAVENOUS | Status: AC
  Administered 2020-10-24: 12:00:00 138 mg via INTRAVENOUS
  Filled 2020-10-24: qty 23

## 2020-10-24 MED ORDER — SODIUM CHLORIDE 0.9% FLUSH
10.0000 mL | Freq: Once | INTRAVENOUS | Status: AC
  Administered 2020-10-24: 10:00:00 10 mL
  Filled 2020-10-24: qty 10

## 2020-10-24 MED ORDER — SODIUM CHLORIDE 0.9 % IV SOLN
Freq: Once | INTRAVENOUS | Status: AC
  Filled 2020-10-24: qty 250

## 2020-10-24 MED ORDER — METHYLPREDNISOLONE SODIUM SUCC 125 MG IJ SOLR
80.0000 mg | Freq: Every day | INTRAMUSCULAR | Status: AC
  Administered 2020-10-24: 11:00:00 80 mg via INTRAVENOUS

## 2020-10-24 MED ORDER — HEPARIN SOD (PORK) LOCK FLUSH 100 UNIT/ML IV SOLN
500.0000 [IU] | Freq: Once | INTRAVENOUS | Status: DC | PRN
  Filled 2020-10-24: qty 5

## 2020-10-24 MED ORDER — SODIUM CHLORIDE 0.9 % IV SOLN
40.0000 mg | Freq: Once | INTRAVENOUS | Status: AC
  Administered 2020-10-24: 12:00:00 40 mg via INTRAVENOUS
  Filled 2020-10-24: qty 4

## 2020-10-24 MED ORDER — METHYLPREDNISOLONE SODIUM SUCC 125 MG IJ SOLR
INTRAMUSCULAR | Status: AC
  Filled 2020-10-24: qty 2

## 2020-10-24 MED ORDER — LORATADINE 10 MG PO TABS
10.0000 mg | ORAL_TABLET | Freq: Every day | ORAL | Status: DC
  Administered 2020-10-24: 11:00:00 10 mg via ORAL

## 2020-10-24 NOTE — Patient Instructions (Signed)
[facility] Discharge Instructions for Patients Receiving Chemotherapy  Today you received the following chemotherapy agents:  Taxol.  To help prevent nausea and vomiting after your treatment, we encourage you to take your nausea medication as directed.   If you develop nausea and vomiting that is not controlled by your nausea medication, call the clinic.   BELOW ARE SYMPTOMS THAT SHOULD BE REPORTED IMMEDIATELY:  *FEVER GREATER THAN 100.5 F  *CHILLS WITH OR WITHOUT FEVER  NAUSEA AND VOMITING THAT IS NOT CONTROLLED WITH YOUR NAUSEA MEDICATION  *UNUSUAL SHORTNESS OF BREATH  *UNUSUAL BRUISING OR BLEEDING  TENDERNESS IN MOUTH AND THROAT WITH OR WITHOUT PRESENCE OF ULCERS  *URINARY PROBLEMS  *BOWEL PROBLEMS  UNUSUAL RASH Items with * indicate a potential emergency and should be followed up as soon as possible.  Feel free to call the clinic should you have any questions or concerns. The clinic phone number is [phone].  Please show the CHEMO ALERT CARD at check-in to the Emergency Department and triage nurse.   

## 2020-11-08 DIAGNOSIS — I10 Essential (primary) hypertension: Secondary | ICD-10-CM | POA: Diagnosis not present

## 2020-11-08 DIAGNOSIS — C50912 Malignant neoplasm of unspecified site of left female breast: Secondary | ICD-10-CM | POA: Diagnosis not present

## 2020-11-11 ENCOUNTER — Ambulatory Visit: Payer: Medicare Other

## 2020-11-18 ENCOUNTER — Telehealth: Payer: Self-pay | Admitting: *Deleted

## 2020-11-18 NOTE — Telephone Encounter (Signed)
Pt called wanting to r/s her appt with Dr. Lisbeth Renshaw on 1/18 d/t being "snowed in". Msg sent to scheduling and Dr. Lisbeth Renshaw to r/s appt. No further needs voiced.

## 2020-11-19 ENCOUNTER — Ambulatory Visit: Payer: Medicare Other | Admitting: Radiation Oncology

## 2020-11-19 ENCOUNTER — Ambulatory Visit: Payer: Medicare Other

## 2020-11-25 ENCOUNTER — Encounter: Payer: Self-pay | Admitting: *Deleted

## 2020-11-27 ENCOUNTER — Ambulatory Visit
Admission: RE | Admit: 2020-11-27 | Discharge: 2020-11-27 | Disposition: A | Discharge status: Home / Self Care | Payer: Medicare Other | Source: Ambulatory Visit | Attending: Radiation Oncology | Admitting: Radiation Oncology

## 2020-11-27 ENCOUNTER — Encounter: Payer: Self-pay | Admitting: Radiation Oncology

## 2020-11-27 ENCOUNTER — Other Ambulatory Visit: Payer: Self-pay

## 2020-11-27 VITALS — BP 137/75 | HR 65 | Temp 97.0°F | Resp 18 | Ht 64.0 in | Wt 133.5 lb

## 2020-11-27 DIAGNOSIS — C50212 Malignant neoplasm of upper-inner quadrant of left female breast: Secondary | ICD-10-CM | POA: Insufficient documentation

## 2020-11-27 DIAGNOSIS — J069 Acute upper respiratory infection, unspecified: Secondary | ICD-10-CM | POA: Insufficient documentation

## 2020-11-27 DIAGNOSIS — I10 Essential (primary) hypertension: Secondary | ICD-10-CM | POA: Insufficient documentation

## 2020-11-27 DIAGNOSIS — Z510 Encounter for antineoplastic radiation therapy: Secondary | ICD-10-CM | POA: Insufficient documentation

## 2020-11-27 DIAGNOSIS — Z9221 Personal history of antineoplastic chemotherapy: Secondary | ICD-10-CM | POA: Diagnosis not present

## 2020-11-27 DIAGNOSIS — F418 Other specified anxiety disorders: Secondary | ICD-10-CM | POA: Insufficient documentation

## 2020-11-27 DIAGNOSIS — Z170 Estrogen receptor positive status [ER+]: Secondary | ICD-10-CM | POA: Insufficient documentation

## 2020-11-27 DIAGNOSIS — Z801 Family history of malignant neoplasm of trachea, bronchus and lung: Secondary | ICD-10-CM | POA: Diagnosis not present

## 2020-11-27 DIAGNOSIS — E785 Hyperlipidemia, unspecified: Secondary | ICD-10-CM | POA: Insufficient documentation

## 2020-11-27 DIAGNOSIS — Z806 Family history of leukemia: Secondary | ICD-10-CM | POA: Diagnosis not present

## 2020-11-27 DIAGNOSIS — Z79899 Other long term (current) drug therapy: Secondary | ICD-10-CM | POA: Insufficient documentation

## 2020-11-27 DIAGNOSIS — M8580 Other specified disorders of bone density and structure, unspecified site: Secondary | ICD-10-CM | POA: Diagnosis not present

## 2020-11-27 DIAGNOSIS — M129 Arthropathy, unspecified: Secondary | ICD-10-CM | POA: Insufficient documentation

## 2020-11-27 NOTE — Progress Notes (Signed)
Radiation Oncology         [phone] ________________________________  Outpatient Follow Up  Name: Doris Ellis        MRN: [id number]  Date of Service: 11/27/2020 DOB: [birth year]  MW:NUUVO, Doris Manner, MD  Truitt Merle, MD     REFERRING PHYSICIAN: Truitt Merle, MD   DIAGNOSIS: The encounter diagnosis was Malignant neoplasm of upper-inner quadrant of left breast in female, estrogen receptor positive ([place]).   HISTORY OF PRESENT ILLNESS: Doris Ellis is a 80 y.o. female with a diagnosis of left breast cancer. She was found to have a screening detected mass in the left breast.  She underwent diagnostic imaging which revealed a mass at 10:00, measurements were not given however on ultrasound she did not have any axillary adenopathy.  She underwent a biopsy on 06/11/2020, this revealed a grade 1-2 invasive ductal carcinoma that measured 9 mm.  Her tumor was ER positive, PR and HER-2 negative with a Ki-67 of 5%.  She underwent left lumpectomy with sentinel node biopsy on 07/05/20 that revealed an invasive ductal carcinoma measuring 1.8 cm, the cancer was grade 1 and margins were clear. Her sampled node was negative. Oncotype score was 33 and she started chemotherapy on 08/09/20 and completed this on 10/24/20. She is seen today to discuss adjuvant radiotherapy.   PREVIOUS RADIATION THERAPY: No   PAST MEDICAL HISTORY:  Past Medical History:  Diagnosis Date  . Anxiety   . Arthritis   . Cancer ([place]) 05/2020   left breast IDC  . Concussion 1985  . Depression   . Hyperlipidemia   . Hypertension   . Mild concussion 2002   tripped over bag at work  . Osteopenia 07/2011   T score -1.8 FRAX 11%/1.5%  . Panic attacks   . Seasonal allergies        PAST SURGICAL HISTORY: Past Surgical History:  Procedure Laterality Date  . ABDOMINAL HYSTERECTOMY  april 1990   TAH BSO  . BACK SURGERY  1996   ruputured disc L-5  . BLADDER SUSPENSION  1982  . BREAST BIOPSY Left   . BREAST LUMPECTOMY WITH RADIOACTIVE  SEED AND SENTINEL LYMPH NODE BIOPSY Left 07/05/2020   Procedure: LEFT BREAST LUMPECTOMY WITH RADIOACTIVE SEED AND LEFT AXILLARY SENTINEL LYMPH NODE BIOPSY;  Surgeon: Alphonsa Overall, MD;  Location: [facility];  Service: General;  Laterality: Left;  LEFT BREAST CANCER  . BREAST SURGERY  1984   cyst-benign  . COLONOSCOPY N/A 03/30/2013   Procedure: COLONOSCOPY;  Surgeon: Rogene Houston, MD;  [facility];  Service: Endoscopy;  Laterality: N/A;  930  . DILATION AND CURETTAGE OF UTERUS  12/09/1988   abnormal uterine bleeding  . IR IMAGING GUIDED PORT INSERTION  08/05/2020     FAMILY HISTORY:  Family History  Problem Relation Age of Onset  . Cancer Father        non-hogkins lymphoma, & CLL  . Diabetes Maternal Uncle   . Hypertension Maternal Grandmother   . Heart disease Maternal Grandfather   . Cancer Maternal Grandfather        lung cancer  . Osteoporosis Maternal Aunt      SOCIAL HISTORY:  reports that she has never smoked. She has never used smokeless tobacco. She reports current alcohol use. She reports current drug use. Drug: Marijuana. The patient is divorced and lives in [place].   ALLERGIES: Lipitor [atorvastatin calcium], Zocor [simvastatin - high dose], and Benadryl [diphenhydramine]   MEDICATIONS:  Current  Outpatient Medications  Medication Sig Dispense Refill  . ALPRAZolam (XANAX) 1 MG tablet Take 1 mg by mouth at bedtime as needed for sleep.    Marland Kitchen amLODipine (NORVASC) 2.5 MG tablet Take 2.5 mg by mouth daily.    . cyclobenzaprine (FLEXERIL) 10 MG tablet TAKE 1 TABLET(10 MG) BY MOUTH THREE TIMES DAILY AS NEEDED FOR MUSCLE SPASMS 30 tablet 0  . fluticasone (FLONASE) 50 MCG/ACT nasal spray Place into both nostrils daily.    Marland Kitchen lidocaine-prilocaine (EMLA) cream Apply to affected area once 30 g 3  . multivitamin (THERAGRAN) per tablet Take 1 tablet by mouth daily.    . naproxen sodium (ALEVE) 220 MG tablet Take 220 mg by mouth.    . ondansetron  (ZOFRAN) 8 MG tablet Take 1 tablet (8 mg total) by mouth 2 (two) times daily as needed (Nausea or vomiting). 30 tablet 1  . pravastatin (PRAVACHOL) 10 MG tablet Take 10 mg by mouth daily.    . prochlorperazine (COMPAZINE) 10 MG tablet Take 1 tablet (10 mg total) by mouth every 6 (six) hours as needed (Nausea or vomiting). 30 tablet 1  . Pyridoxine HCl (VITAMIN B-6 PO) Take 1 tablet by mouth daily.     . sertraline (ZOLOFT) 50 MG tablet Take 50 mg by mouth daily.    . traMADol (ULTRAM) 50 MG tablet Take 1 tablet (50 mg total) by mouth every 6 (six) hours as needed for moderate pain or severe pain. 12 tablet 0   No current facility-administered medications for this encounter.     REVIEW OF SYSTEMS: On review of systems, the patient reports she feels quite well. She does have some postnasal drip and has had this for several weeks during and since her last chemotherapy treatment in December. She finished a course of antibiotics and has had improvement but still some persistent drainage. She does not have any fevers or chills and did not test for covid at that time.     PHYSICAL EXAM:  Wt Readings from Last 3 Encounters:  10/24/20 135 lb (61.2 kg)  10/18/20 137 lb 14.4 oz (62.6 kg)  10/04/20 135 lb 8 oz (61.5 kg)   In general this is a well appearing caucasian female in no acute distress. She's alert and oriented x4 and appropriate throughout the examination. Cardiopulmonary assessment is negative for acute distress and she exhibits normal effort. Bilateral breast exam is deferred.    ECOG = 0  0 - Asymptomatic (Fully active, able to carry on all predisease activities without restriction)  1 - Symptomatic but completely ambulatory (Restricted in physically strenuous activity but ambulatory and able to carry out work of a light or sedentary nature. For example, light housework, office work)  2 - Symptomatic, <50% in bed during the day (Ambulatory and capable of all self care but unable to  carry out any work activities. Up and about more than 50% of waking hours)  3 - Symptomatic, >50% in bed, but not bedbound (Capable of only limited self-care, confined to bed or chair 50% or more of waking hours)  4 - Bedbound (Completely disabled. Cannot carry on any self-care. Totally confined to bed or chair)  5 - Death   Eustace Pen MM, Creech RH, Tormey DC, et al. [phone]). "Toxicity and response criteria of the [facility]". [place] Oncol. 5 (6): 649-55    LABORATORY DATA:  Lab Results  Component Value Date   WBC 4.6 10/24/2020   HGB 11.0 (L) 10/24/2020  HCT 32.9 (L) 10/24/2020   MCV 93.2 10/24/2020   PLT 399 10/24/2020   Lab Results  Component Value Date   NA 135 10/24/2020   K 4.3 10/24/2020   CL 104 10/24/2020   CO2 24 10/24/2020   Lab Results  Component Value Date   ALT 10 10/24/2020   AST 21 10/24/2020   ALKPHOS 43 10/24/2020   BILITOT 0.4 10/24/2020      RADIOGRAPHY: No results found.     IMPRESSION/PLAN: 1. Stage IA, pT1cN0M0, grade 2, ER positive invasive ductal carcinoma of the left breast. Dr. Lisbeth Renshaw discusses the final results of pathology and reviews the nature of left breast disease. She has done well since completing chemotherapy. Dr. Lisbeth Renshaw reviewed the rationale for external radiotherapy to the breast to reduce risks of local recurrence  followed by antiestrogen therapy. We again discussed the option of forgoing radiotherapy in certain populations of patients. She is interested in remaining aggressive about her cancer care.   We discussed the risks, benefits, short, and long term effects of radiotherapy, and the patient is interested in proceeding. Dr. Lisbeth Renshaw discusses the delivery and logistics of radiotherapy and recommends 4 weeks of radiotherapy with deep inspiration breath hold technique for the left breast. She will simulate today. Written consent is obtained and placed in the chart, a copy was provided to the patient.  2. URI  symptoms. The patient's been noticing steady improvement of symptoms but was encouraged to notify her PCP if she has any symptoms that worsen which would require re-evaluation.  In a visit lasting 45 minutes, greater than 50% of the time was spent face to face discussing the patient's condition, in preparation for the discussion, and coordinating the patient's care.    The above documentation reflects my direct findings during this shared patient visit. Please see the separate note by Dr. Lisbeth Renshaw on this date for the remainder of the patient's plan of care.    Carola Rhine, PAC

## 2020-12-01 DIAGNOSIS — Z170 Estrogen receptor positive status [ER+]: Secondary | ICD-10-CM | POA: Diagnosis not present

## 2020-12-01 DIAGNOSIS — C50212 Malignant neoplasm of upper-inner quadrant of left female breast: Secondary | ICD-10-CM | POA: Diagnosis not present

## 2020-12-01 DIAGNOSIS — Z510 Encounter for antineoplastic radiation therapy: Secondary | ICD-10-CM | POA: Diagnosis not present

## 2020-12-02 ENCOUNTER — Other Ambulatory Visit: Payer: Self-pay | Admitting: Hematology

## 2020-12-03 ENCOUNTER — Encounter: Payer: Self-pay | Admitting: *Deleted

## 2020-12-03 ENCOUNTER — Other Ambulatory Visit: Payer: Self-pay | Admitting: Hematology

## 2020-12-03 MED ORDER — CYCLOBENZAPRINE HCL 10 MG PO TABS: ORAL_TABLET | ORAL | 0 refills | Status: DC

## 2020-12-04 ENCOUNTER — Telehealth: Payer: Self-pay | Admitting: Hematology

## 2020-12-04 NOTE — Telephone Encounter (Signed)
Scheduled appt per 2/1 sch msg - mailed letter with appt date and time

## 2020-12-05 ENCOUNTER — Other Ambulatory Visit: Payer: Self-pay

## 2020-12-05 ENCOUNTER — Ambulatory Visit
Admission: RE | Admit: 2020-12-05 | Discharge: 2020-12-05 | Disposition: A | Discharge status: Home / Self Care | Payer: Medicare Other | Source: Ambulatory Visit | Attending: Radiation Oncology | Admitting: Radiation Oncology

## 2020-12-05 DIAGNOSIS — C50212 Malignant neoplasm of upper-inner quadrant of left female breast: Secondary | ICD-10-CM | POA: Diagnosis not present

## 2020-12-05 DIAGNOSIS — Z170 Estrogen receptor positive status [ER+]: Secondary | ICD-10-CM | POA: Diagnosis not present

## 2020-12-05 DIAGNOSIS — Z510 Encounter for antineoplastic radiation therapy: Secondary | ICD-10-CM | POA: Insufficient documentation

## 2020-12-05 NOTE — Progress Notes (Signed)
Pt here for patient teaching.  Pt given Radiation and You booklet, skin care instructions, Alra deodorant and Radiaplex gel.  Reviewed areas of pertinence such as fatigue, hair loss, skin changes, breast tenderness and breast swelling . Pt able to give teach back of to pat skin and use unscented/gentle soap,apply Radiaplex bid, avoid applying anything to skin within 4 hours of treatment, avoid wearing an under wire bra and to use an electric razor if they must shave. Pt verbalizes understanding of information given and will contact nursing with any questions or concerns.     Dovid Bartko M. Jennika Ringgold RN, BSN      

## 2020-12-06 ENCOUNTER — Ambulatory Visit
Admission: RE | Admit: 2020-12-06 | Discharge: 2020-12-06 | Disposition: A | Discharge status: Home / Self Care | Payer: Medicare Other | Source: Ambulatory Visit | Attending: Radiation Oncology | Admitting: Radiation Oncology

## 2020-12-06 DIAGNOSIS — Z170 Estrogen receptor positive status [ER+]: Secondary | ICD-10-CM | POA: Diagnosis not present

## 2020-12-06 DIAGNOSIS — C50212 Malignant neoplasm of upper-inner quadrant of left female breast: Secondary | ICD-10-CM | POA: Diagnosis not present

## 2020-12-06 DIAGNOSIS — Z510 Encounter for antineoplastic radiation therapy: Secondary | ICD-10-CM | POA: Diagnosis not present

## 2020-12-06 MED ORDER — RADIAPLEXRX EX GEL: Freq: Once | CUTANEOUS | Status: AC

## 2020-12-06 MED ORDER — ALRA NON-METALLIC DEODORANT (RAD-ONC)
1.0000 "application " | Freq: Once | TOPICAL | Status: AC
  Administered 2020-12-06: 1 via TOPICAL

## 2020-12-09 ENCOUNTER — Ambulatory Visit
Admission: RE | Admit: 2020-12-09 | Discharge: 2020-12-09 | Disposition: A | Discharge status: Home / Self Care | Payer: Medicare Other | Source: Ambulatory Visit | Attending: Radiation Oncology | Admitting: Radiation Oncology

## 2020-12-09 ENCOUNTER — Other Ambulatory Visit: Payer: Self-pay

## 2020-12-09 DIAGNOSIS — C50212 Malignant neoplasm of upper-inner quadrant of left female breast: Secondary | ICD-10-CM | POA: Diagnosis not present

## 2020-12-09 DIAGNOSIS — Z510 Encounter for antineoplastic radiation therapy: Secondary | ICD-10-CM | POA: Diagnosis not present

## 2020-12-09 DIAGNOSIS — Z170 Estrogen receptor positive status [ER+]: Secondary | ICD-10-CM | POA: Diagnosis not present

## 2020-12-10 ENCOUNTER — Other Ambulatory Visit: Payer: Self-pay

## 2020-12-10 ENCOUNTER — Ambulatory Visit
Admission: RE | Admit: 2020-12-10 | Discharge: 2020-12-10 | Disposition: A | Discharge status: Home / Self Care | Payer: Medicare Other | Source: Ambulatory Visit | Attending: Radiation Oncology | Admitting: Radiation Oncology

## 2020-12-10 DIAGNOSIS — C50212 Malignant neoplasm of upper-inner quadrant of left female breast: Secondary | ICD-10-CM | POA: Diagnosis not present

## 2020-12-10 DIAGNOSIS — Z170 Estrogen receptor positive status [ER+]: Secondary | ICD-10-CM | POA: Diagnosis not present

## 2020-12-10 DIAGNOSIS — Z510 Encounter for antineoplastic radiation therapy: Secondary | ICD-10-CM | POA: Diagnosis not present

## 2020-12-11 ENCOUNTER — Ambulatory Visit
Admission: RE | Admit: 2020-12-11 | Discharge: 2020-12-11 | Disposition: A | Discharge status: Home / Self Care | Payer: Medicare Other | Source: Ambulatory Visit | Attending: Radiation Oncology | Admitting: Radiation Oncology

## 2020-12-11 DIAGNOSIS — Z510 Encounter for antineoplastic radiation therapy: Secondary | ICD-10-CM | POA: Diagnosis not present

## 2020-12-11 DIAGNOSIS — Z170 Estrogen receptor positive status [ER+]: Secondary | ICD-10-CM | POA: Diagnosis not present

## 2020-12-11 DIAGNOSIS — C50212 Malignant neoplasm of upper-inner quadrant of left female breast: Secondary | ICD-10-CM | POA: Diagnosis not present

## 2020-12-12 ENCOUNTER — Ambulatory Visit
Admission: RE | Admit: 2020-12-12 | Discharge: 2020-12-12 | Disposition: A | Discharge status: Home / Self Care | Payer: Medicare Other | Source: Ambulatory Visit | Attending: Radiation Oncology | Admitting: Radiation Oncology

## 2020-12-12 DIAGNOSIS — C50212 Malignant neoplasm of upper-inner quadrant of left female breast: Secondary | ICD-10-CM | POA: Diagnosis not present

## 2020-12-12 DIAGNOSIS — Z510 Encounter for antineoplastic radiation therapy: Secondary | ICD-10-CM | POA: Diagnosis not present

## 2020-12-12 DIAGNOSIS — Z170 Estrogen receptor positive status [ER+]: Secondary | ICD-10-CM | POA: Diagnosis not present

## 2020-12-13 ENCOUNTER — Other Ambulatory Visit: Payer: Self-pay

## 2020-12-13 ENCOUNTER — Ambulatory Visit
Admission: RE | Admit: 2020-12-13 | Discharge: 2020-12-13 | Disposition: A | Discharge status: Home / Self Care | Payer: Medicare Other | Source: Ambulatory Visit | Attending: Radiation Oncology | Admitting: Radiation Oncology

## 2020-12-13 DIAGNOSIS — Z170 Estrogen receptor positive status [ER+]: Secondary | ICD-10-CM | POA: Diagnosis not present

## 2020-12-13 DIAGNOSIS — Z510 Encounter for antineoplastic radiation therapy: Secondary | ICD-10-CM | POA: Diagnosis not present

## 2020-12-13 DIAGNOSIS — C50212 Malignant neoplasm of upper-inner quadrant of left female breast: Secondary | ICD-10-CM | POA: Diagnosis not present

## 2020-12-16 ENCOUNTER — Ambulatory Visit
Admission: RE | Admit: 2020-12-16 | Discharge: 2020-12-16 | Disposition: A | Discharge status: Home / Self Care | Payer: Medicare Other | Source: Ambulatory Visit | Attending: Radiation Oncology | Admitting: Radiation Oncology

## 2020-12-16 DIAGNOSIS — C50212 Malignant neoplasm of upper-inner quadrant of left female breast: Secondary | ICD-10-CM | POA: Diagnosis not present

## 2020-12-16 DIAGNOSIS — Z170 Estrogen receptor positive status [ER+]: Secondary | ICD-10-CM | POA: Diagnosis not present

## 2020-12-16 DIAGNOSIS — Z510 Encounter for antineoplastic radiation therapy: Secondary | ICD-10-CM | POA: Diagnosis not present

## 2020-12-17 ENCOUNTER — Ambulatory Visit
Admission: RE | Admit: 2020-12-17 | Discharge: 2020-12-17 | Disposition: A | Discharge status: Home / Self Care | Payer: Medicare Other | Source: Ambulatory Visit | Attending: Radiation Oncology | Admitting: Radiation Oncology

## 2020-12-17 DIAGNOSIS — Z510 Encounter for antineoplastic radiation therapy: Secondary | ICD-10-CM | POA: Diagnosis not present

## 2020-12-17 DIAGNOSIS — Z170 Estrogen receptor positive status [ER+]: Secondary | ICD-10-CM | POA: Diagnosis not present

## 2020-12-17 DIAGNOSIS — C50212 Malignant neoplasm of upper-inner quadrant of left female breast: Secondary | ICD-10-CM | POA: Diagnosis not present

## 2020-12-18 ENCOUNTER — Encounter: Payer: Self-pay | Admitting: Radiation Oncology

## 2020-12-18 ENCOUNTER — Ambulatory Visit
Admission: RE | Admit: 2020-12-18 | Discharge: 2020-12-18 | Disposition: A | Discharge status: Home / Self Care | Payer: Medicare Other | Source: Ambulatory Visit | Attending: Radiation Oncology | Admitting: Radiation Oncology

## 2020-12-18 DIAGNOSIS — C50212 Malignant neoplasm of upper-inner quadrant of left female breast: Secondary | ICD-10-CM | POA: Diagnosis not present

## 2020-12-18 DIAGNOSIS — Z170 Estrogen receptor positive status [ER+]: Secondary | ICD-10-CM | POA: Diagnosis not present

## 2020-12-18 DIAGNOSIS — Z510 Encounter for antineoplastic radiation therapy: Secondary | ICD-10-CM | POA: Diagnosis not present

## 2020-12-19 ENCOUNTER — Ambulatory Visit
Admission: RE | Admit: 2020-12-19 | Discharge: 2020-12-19 | Disposition: A | Discharge status: Home / Self Care | Payer: Medicare Other | Source: Ambulatory Visit | Attending: Radiation Oncology | Admitting: Radiation Oncology

## 2020-12-19 DIAGNOSIS — Z510 Encounter for antineoplastic radiation therapy: Secondary | ICD-10-CM | POA: Diagnosis not present

## 2020-12-19 DIAGNOSIS — C50212 Malignant neoplasm of upper-inner quadrant of left female breast: Secondary | ICD-10-CM | POA: Diagnosis not present

## 2020-12-19 DIAGNOSIS — Z170 Estrogen receptor positive status [ER+]: Secondary | ICD-10-CM | POA: Diagnosis not present

## 2020-12-20 ENCOUNTER — Ambulatory Visit
Admission: RE | Admit: 2020-12-20 | Discharge: 2020-12-20 | Disposition: A | Discharge status: Home / Self Care | Payer: Medicare Other | Source: Ambulatory Visit | Attending: Radiation Oncology | Admitting: Radiation Oncology

## 2020-12-20 ENCOUNTER — Ambulatory Visit: Payer: Medicare Other | Admitting: Radiation Oncology

## 2020-12-20 DIAGNOSIS — Z170 Estrogen receptor positive status [ER+]: Secondary | ICD-10-CM | POA: Diagnosis not present

## 2020-12-20 DIAGNOSIS — Z510 Encounter for antineoplastic radiation therapy: Secondary | ICD-10-CM | POA: Diagnosis not present

## 2020-12-20 DIAGNOSIS — C50212 Malignant neoplasm of upper-inner quadrant of left female breast: Secondary | ICD-10-CM | POA: Diagnosis not present

## 2020-12-23 ENCOUNTER — Ambulatory Visit
Admission: RE | Admit: 2020-12-23 | Discharge: 2020-12-23 | Disposition: A | Discharge status: Home / Self Care | Payer: Medicare Other | Source: Ambulatory Visit | Attending: Radiation Oncology | Admitting: Radiation Oncology

## 2020-12-23 DIAGNOSIS — Z170 Estrogen receptor positive status [ER+]: Secondary | ICD-10-CM | POA: Diagnosis not present

## 2020-12-23 DIAGNOSIS — Z510 Encounter for antineoplastic radiation therapy: Secondary | ICD-10-CM | POA: Diagnosis not present

## 2020-12-23 DIAGNOSIS — C50212 Malignant neoplasm of upper-inner quadrant of left female breast: Secondary | ICD-10-CM | POA: Diagnosis not present

## 2020-12-24 ENCOUNTER — Ambulatory Visit
Admission: RE | Admit: 2020-12-24 | Discharge: 2020-12-24 | Disposition: A | Discharge status: Home / Self Care | Payer: Medicare Other | Source: Ambulatory Visit | Attending: Radiation Oncology | Admitting: Radiation Oncology

## 2020-12-24 ENCOUNTER — Other Ambulatory Visit: Payer: Self-pay

## 2020-12-24 DIAGNOSIS — C50212 Malignant neoplasm of upper-inner quadrant of left female breast: Secondary | ICD-10-CM | POA: Diagnosis not present

## 2020-12-24 DIAGNOSIS — Z170 Estrogen receptor positive status [ER+]: Secondary | ICD-10-CM | POA: Diagnosis not present

## 2020-12-24 DIAGNOSIS — Z510 Encounter for antineoplastic radiation therapy: Secondary | ICD-10-CM | POA: Diagnosis not present

## 2020-12-25 ENCOUNTER — Ambulatory Visit
Admission: RE | Admit: 2020-12-25 | Discharge: 2020-12-25 | Disposition: A | Discharge status: Home / Self Care | Payer: Medicare Other | Source: Ambulatory Visit | Attending: Radiation Oncology | Admitting: Radiation Oncology

## 2020-12-25 DIAGNOSIS — Z170 Estrogen receptor positive status [ER+]: Secondary | ICD-10-CM | POA: Diagnosis not present

## 2020-12-25 DIAGNOSIS — Z510 Encounter for antineoplastic radiation therapy: Secondary | ICD-10-CM | POA: Diagnosis not present

## 2020-12-25 DIAGNOSIS — C50212 Malignant neoplasm of upper-inner quadrant of left female breast: Secondary | ICD-10-CM | POA: Diagnosis not present

## 2020-12-25 NOTE — Progress Notes (Signed)
  Radiation Oncology         [phone]) [phone] ________________________________  Name: PHYLIS JAVED MRN: [id number]  Date: 12/18/2020  DOB: 10/30/1941  SIMULATION NOTE   NARRATIVE:  The patient underwent simulation today for ongoing radiation therapy.  The existing CT study set was employed for the purpose of virtual treatment planning.  The target and avoidance structures were reviewed and modified as necessary.  Treatment planning then occurred.  The radiation boost prescription was entered and confirmed.  A total of 3 complex treatment devices were fabricated in the form of multi-leaf collimators to shape radiation around the targets while maximally excluding nearby normal structures. I have requested : Isodose Plan.    PLAN:  This modified radiation beam arrangement is intended to continue the current radiation dose to an additional 8 Gy in 4 fractions for a total cumulative dose of 50.56 Gy.    ------------------------------------------------  Jodelle Gross, MD, PhD

## 2020-12-25 NOTE — Progress Notes (Signed)
[facility]   [phone] [phone]   Clinic Follow up Note   Patient Care Team: Asencion Noble, MD as PCP - General (Internal Medicine) Rockwell Germany, RN as Oncology Nurse Navigator Mauro Kaufmann, RN as Oncology Nurse Navigator Alphonsa Overall, MD as Consulting Physician (General Surgery) Kyung Rudd, MD as Consulting Physician (Radiation Oncology) Truitt Merle, MD as Consulting Physician (Hematology) Alla Feeling, NP as Nurse Practitioner (Nurse Practitioner)  Date of Service:  12/27/2020  CHIEF COMPLAINT: F/u of left breast cancer  SUMMARY OF ONCOLOGIC HISTORY: Oncology History Overview Note  Cancer Staging Malignant neoplasm of upper-inner quadrant of female breast [facility]) Staging form: Breast, AJCC 8th Edition - Clinical stage from 06/11/2020: Stage IA (cT1b, cN0, cM0, G2, ER+, PR-, HER2: Equivocal) - Signed by Alla Feeling, NP on 07/01/2020 - Pathologic stage from 07/05/2020: Stage IA (pT1c, pN0, cM0, G2, ER+, PR-, HER2-, Oncotype DX score: 33) - Signed by Truitt Merle, MD on 07/29/2020    Malignant neoplasm of upper-inner quadrant of female breast ([place])  05/28/2020 Mammogram   Diagnostic left mammogram/US  -On physical exam, I palpate discrete focal soft thickening in the 10-11 o'clock location of the LEFT breast. -Targeted ultrasound is performed, showing an irregular taller than wide hypoechoic mass with indistinct margins in the 10 o'clock location of the LEFT breast 8 centimeters from the nipple. There is associated posterior acoustic shadowing. No significant internal blood flow identified on Doppler evaluation.  IMPRESSION: Suspicious mass in the 10 o'clock location of the LEFT breast. No LEFT axillary adenopathy.     06/11/2020 Cancer Staging   Staging form: Breast, AJCC 8th Edition - Clinical stage from 06/11/2020: Stage IA (cT1b, cN0, cM0, G2, ER+, PR-, HER2: Equivocal) - Signed by Alla Feeling, NP on 07/01/2020   06/11/2020  Initial Biopsy   FINAL MICROSCOPIC DIAGNOSIS: A. BREAST, 10:00, LEFT, BIOPSY: - Invasive ductal carcinoma The carcinoma appears Nottingham grade 1-2 of 3 and measures 0.9 cm in greatest linear extent  PROGNOSTIC INDICATOR RESULTS: The tumor cells are EQUIVOCAL for Her2 (2+) Estrogen Receptor: 90%, MODERATE STAINING INTENSITY Progesterone Receptor: NEGATIVE Proliferation Marker Ki-67: 5%  FLOURESCENCE IN-SITU HYBRIDIZATION RESULTS: GROUP 5: HER2 **NEGATIVE** RATIO of HER2/CEN 17 SIGNALS: 1.18 AVERAGE HER2 COPY NUMBER PER CELL: 1.30   07/01/2020 Initial Diagnosis   Malignant neoplasm of upper-inner quadrant of female breast ([place])   07/05/2020 Surgery   LEFT BREAST LUMPECTOMY WITH RADIOACTIVE SEED AND LEFT AXILLARY SENTINEL LYMPH NODE BIOPSY by Dr Lucia Gaskins    07/05/2020 Pathology Results   FINAL MICROSCOPIC DIAGNOSIS:   A. BREAST, LEFT, LUMPECTOMY:  - Invasive ductal carcinoma, 1.8 cm.  - Margins not involved.  - Biopsy site and biopsy clip.  - See oncology table.   B. LYMPH NODE, LEFT AXILLARY, SENTINEL, EXCISION:  - One lymph node with no metastatic carcinoma (0/1).    07/05/2020 Oncotype testing   Oncotype  Recurrence Score 33 Distant recurrence risk at 9 years with Tamoxifen alone is 21% There is >15% benefit of adjuvant chemotherapy.    07/05/2020 Cancer Staging   Staging form: Breast, AJCC 8th Edition - Pathologic stage from 07/05/2020: Stage IA (pT1c, pN0, cM0, G2, ER+, PR-, HER2-, Oncotype DX score: 33) - Signed by Truitt Merle, MD on 07/29/2020   08/09/2020 - 10/24/2020 Chemotherapy   Weekly Taxol for 12 weeks starting 08/09/20-10/24/20   12/06/2020 - 01/01/2021 Radiation Therapy   Adjuvant Radiation with Dr Lisbeth Renshaw       CURRENT THERAPY:  Adjuvant Radiation  by Dr Lisbeth Renshaw 12/06/20-01/01/21  INTERVAL HISTORY:  Doris Ellis is here for a follow up. She presents to the clinic alone. She is tolerating radiation well overall,  She had sinus infection after chemo  She has numbness in  her feet in the morning, improves with activity, no balance issue or fall  NO neuropathy in fingers, she has OA in right wrist  She lives alone and is independent.   All other systems were reviewed with the patient and are negative.  MEDICAL HISTORY:  Past Medical History:  Diagnosis Date  . Anxiety   . Arthritis   . Cancer ([place]) 05/2020   left breast IDC  . Concussion 1985  . Depression   . Hyperlipidemia   . Hypertension   . Mild concussion 2002   tripped over bag at work  . Osteopenia 07/2011   T score -1.8 FRAX 11%/1.5%  . Panic attacks   . Seasonal allergies     SURGICAL HISTORY: Past Surgical History:  Procedure Laterality Date  . ABDOMINAL HYSTERECTOMY  april 1990   TAH BSO  . BACK SURGERY  1996   ruputured disc L-5  . BLADDER SUSPENSION  1982  . BREAST BIOPSY Left   . BREAST LUMPECTOMY WITH RADIOACTIVE SEED AND SENTINEL LYMPH NODE BIOPSY Left 07/05/2020   Procedure: LEFT BREAST LUMPECTOMY WITH RADIOACTIVE SEED AND LEFT AXILLARY SENTINEL LYMPH NODE BIOPSY;  Surgeon: Alphonsa Overall, MD;  Location: [facility];  Service: General;  Laterality: Left;  LEFT BREAST CANCER  . BREAST SURGERY  1984   cyst-benign  . COLONOSCOPY N/A 03/30/2013   Procedure: COLONOSCOPY;  Surgeon: Rogene Houston, MD;  [facility];  Service: Endoscopy;  Laterality: N/A;  930  . DILATION AND CURETTAGE OF UTERUS  12/09/1988   abnormal uterine bleeding  . IR IMAGING GUIDED PORT INSERTION  08/05/2020    I have reviewed the social history and family history with the patient and they are unchanged from previous note.  ALLERGIES:  is allergic to lipitor [atorvastatin calcium], zocor [simvastatin - high dose], and benadryl [diphenhydramine].  MEDICATIONS:  Current Outpatient Medications  Medication Sig Dispense Refill  . ALPRAZolam (XANAX) 1 MG tablet Take 1 mg by mouth at bedtime as needed for sleep.    Marland Kitchen amLODipine (NORVASC) 2.5 MG tablet Take 2.5 mg by mouth daily.    .  cyclobenzaprine (FLEXERIL) 10 MG tablet TAKE 1 TABLET(10 MG) BY MOUTH THREE TIMES DAILY AS NEEDED FOR MUSCLE SPASMS 30 tablet 0  . fluticasone (FLONASE) 50 MCG/ACT nasal spray Place into both nostrils daily.    Marland Kitchen lidocaine-prilocaine (EMLA) cream Apply to affected area once 30 g 3  . multivitamin (THERAGRAN) per tablet Take 1 tablet by mouth daily.    . naproxen sodium (ALEVE) 220 MG tablet Take 220 mg by mouth.    . ondansetron (ZOFRAN) 8 MG tablet Take 1 tablet (8 mg total) by mouth 2 (two) times daily as needed (Nausea or vomiting). 30 tablet 1  . pravastatin (PRAVACHOL) 10 MG tablet Take 10 mg by mouth daily.    . prochlorperazine (COMPAZINE) 10 MG tablet Take 1 tablet (10 mg total) by mouth every 6 (six) hours as needed (Nausea or vomiting). 30 tablet 1  . Pyridoxine HCl (VITAMIN B-6 PO) Take 1 tablet by mouth daily.     . sertraline (ZOLOFT) 50 MG tablet Take 50 mg by mouth daily.    . traMADol (ULTRAM) 50 MG tablet Take 1 tablet (50 mg total)  by mouth every 6 (six) hours as needed for moderate pain or severe pain. (Patient not taking: Reported on 11/27/2020) 12 tablet 0   No current facility-administered medications for this visit.    PHYSICAL EXAMINATION: ECOG PERFORMANCE STATUS: 1 - Symptomatic but completely ambulatory  Vitals:   12/27/20 1609  BP: 139/70  Pulse: 77  Resp: 17  Temp: 98.5 F (36.9 C)  SpO2: 99%   Filed Weights   12/27/20 1609  Weight: 137 lb (62.1 kg)    GENERAL:alert, no distress and comfortable SKIN: skin color, texture, turgor are normal, no rashes or significant lesions, scatter rashes on her arms and skin erythema on face  EYES: normal, Conjunctiva are pink and non-injected, sclera clear NECK: supple, thyroid normal size, non-tender, without nodularity LYMPH:  no palpable lymphadenopathy in the cervical, axillary  NEURO: alert & oriented x 3 with fluent speech, no focal motor/sensory deficits Breasts: Breast inspection showed them to be symmetrical  with no nipple discharge. (+) diffuse skin erythema and hyperpigmentation of left breast, secondary lesion.  No skin ulcers or peeling.    LABORATORY DATA:  I have reviewed the data as listed CBC Latest Ref Rng & Units 10/24/2020 10/18/2020 10/11/2020  WBC 4.0 - 10.5 K/uL 4.6 4.8 5.2  Hemoglobin 12.0 - 15.0 g/dL 11.0(L) 10.8(L) 11.2(L)  Hematocrit 36.0 - 46.0 % 32.9(L) 32.2(L) 34.1(L)  Platelets 150 - 400 K/uL 399 356 365     CMP Latest Ref Rng & Units 10/24/2020 10/18/2020 10/11/2020  Glucose 70 - 99 mg/dL 113(H) 107(H) 108(H)  BUN 8 - 23 mg/dL '13 12 13  ' Creatinine 0.44 - 1.00 mg/dL 0.71 0.70 0.74  Sodium 135 - 145 mmol/L 135 132(L) 137  Potassium 3.5 - 5.1 mmol/L 4.3 4.2 4.2  Chloride 98 - 111 mmol/L 104 101 107  CO2 22 - 32 mmol/L '24 22 22  ' Calcium 8.9 - 10.3 mg/dL 9.4 9.5 9.2  Total Protein 6.5 - 8.1 g/dL 7.3 7.3 7.2  Total Bilirubin 0.3 - 1.2 mg/dL 0.4 0.4 0.4  Alkaline Phos 38 - 126 U/L 43 40 41  AST 15 - 41 U/L 21 32 24  ALT 0 - 44 U/L '10 12 11      ' RADIOGRAPHIC STUDIES: I have personally reviewed the radiological images as listed and agreed with the findings in the report. No results found.   ASSESSMENT & PLAN:  MICHALINA CALBERT is a 80 y.o. female with   1.Left breastcancer, grade 1-2, ER+/PR-/HER2- Ki-67 5%,pT1cN0M0, Grade 1,stage IA, RS 33, -She was diagnosed in 06/2020 with grade 1-2 invasive ductal carcinoma of the left breast.  -She underwent left lumpectomy with SLNB with Dr Lucia Gaskins on 07/05/20.Her surgicalpathology showeda 1.8 cm tumor with 1negativelymph node. Surgical margins were negative.HerOncotype Dx testingshowed high risk disease with recurrence score of 33. -She underwent adjuvant Chemo with weekly Taxol 08/09/20-10/24/20.  -She proceeded with adjuvant radiation with Dr Lisbeth Renshaw on 12/06/20. She plans to complete on 01/01/21.  She is tolerating well overall.  --Given the strong ER and PR positivity, I do recommend adjuvant aromatase inhibitor or  tamoxifen to reduce her risk of cancer recurrence,  The potential benefit and side effects, which includes but not limited to, hot flash, skin and vaginal dryness, metabolic changes ( increased blood glucose, cholesterol, weight, etc.), slightly in increased risk of cardiovascular disease, cataracts, muscular and joint discomfort, osteopenia and osteoporosis from AI, slightly increased risk of thrombosis from tamoxifen, etc, were discussed with her in great details.  She has had  hysterectomy, no risk of endometrial cancer from tamoxifen.  She is interested, and we'll start in a month when she recovers well from radiation. -Her bone density scan from 2018 showed osteopenia, I recommend a repeated one in the next month.  If she does have osteoporosis or high risk osteopenia, I will recommend tamoxifen -phone visit with her after DEXA  -she is agreeable with survivorship in 3 to 4 months  2.Osteopenia -DEXA on 04/01/2017 shows osteopenia in the right femur neck with T score -1.6.Her FRAX score shows a 16.9% probability of a major osteoporotic fracture in 10 years and 3.1% risk for hip fracture -We encouraged her to begin calcium and vitamin D once daily and continue weightbearing exercise -Will repeatDEXAat [facility] in 3-4 weeks, she agrees   3.  Peripheral neuropathy, grade 1 -She reports mild numbness on his toes since completed chemotherapy, no impact on her walking or function.  No significant neuropathy of her fingers.  This is likely related to Taxol -I encouraged her exercise, and take vitamin B complex  PLAN: -DEXA scan at [facility] in 3-4 weeks -phone visit in 4 weeks,will call in antiestrogen therapy on next visit based on her DEXA result    No problem-specific Assessment & Plan notes found for this encounter.   No orders of the defined types were placed in this encounter.  All questions were answered. The patient knows to call the clinic with any problems, questions or concerns. No  barriers to learning was detected. The total time spent in the appointment was 30 minutes.     Truitt Merle, MD 12/27/2020   I, Joslyn Devon, am acting as scribe for Truitt Merle, MD.   I have reviewed the above documentation for accuracy and completeness, and I agree with the above.

## 2020-12-26 ENCOUNTER — Telehealth: Payer: Self-pay | Admitting: Medical Oncology

## 2020-12-26 ENCOUNTER — Ambulatory Visit
Admission: RE | Admit: 2020-12-26 | Discharge: 2020-12-26 | Disposition: A | Discharge status: Home / Self Care | Payer: Medicare Other | Source: Ambulatory Visit | Attending: Radiation Oncology | Admitting: Radiation Oncology

## 2020-12-26 DIAGNOSIS — C50212 Malignant neoplasm of upper-inner quadrant of left female breast: Secondary | ICD-10-CM | POA: Diagnosis not present

## 2020-12-26 DIAGNOSIS — Z170 Estrogen receptor positive status [ER+]: Secondary | ICD-10-CM | POA: Diagnosis not present

## 2020-12-26 DIAGNOSIS — Z510 Encounter for antineoplastic radiation therapy: Secondary | ICD-10-CM | POA: Diagnosis not present

## 2020-12-26 NOTE — Telephone Encounter (Signed)
[id number]: outgoing call Spoke with patient regarding it is time for her study 24 week assessment. Patient will be in clinic tomorrow with RT and appointment with Dr. Burr Medico. Inquired with patient if I could meet with her to complete this next assessment. Patient stated that was fine. I thanked patient and confirmed meeting with her tomorrow during her appt with Dr. Burr Medico. Patient thanked for her time and encouraged to call with questions.  Maxwell Marion, RN, BSN, [facility] 12/26/2020 2:29 PM

## 2020-12-27 ENCOUNTER — Inpatient Hospital Stay: Payer: Medicare Other | Attending: Nurse Practitioner | Admitting: Hematology

## 2020-12-27 ENCOUNTER — Other Ambulatory Visit: Payer: Self-pay

## 2020-12-27 ENCOUNTER — Encounter: Payer: Self-pay | Admitting: Medical Oncology

## 2020-12-27 ENCOUNTER — Ambulatory Visit
Admission: RE | Admit: 2020-12-27 | Discharge: 2020-12-27 | Disposition: A | Discharge status: Home / Self Care | Payer: Medicare Other | Source: Ambulatory Visit | Attending: Radiation Oncology | Admitting: Radiation Oncology

## 2020-12-27 ENCOUNTER — Encounter: Payer: Self-pay | Admitting: Hematology

## 2020-12-27 VITALS — BP 139/70 | HR 77 | Temp 98.5°F | Resp 17 | Ht 64.0 in | Wt 137.0 lb

## 2020-12-27 DIAGNOSIS — M8580 Other specified disorders of bone density and structure, unspecified site: Secondary | ICD-10-CM | POA: Insufficient documentation

## 2020-12-27 DIAGNOSIS — E785 Hyperlipidemia, unspecified: Secondary | ICD-10-CM | POA: Diagnosis not present

## 2020-12-27 DIAGNOSIS — Z90722 Acquired absence of ovaries, bilateral: Secondary | ICD-10-CM | POA: Insufficient documentation

## 2020-12-27 DIAGNOSIS — Z90710 Acquired absence of both cervix and uterus: Secondary | ICD-10-CM | POA: Insufficient documentation

## 2020-12-27 DIAGNOSIS — Z170 Estrogen receptor positive status [ER+]: Secondary | ICD-10-CM

## 2020-12-27 DIAGNOSIS — Z9221 Personal history of antineoplastic chemotherapy: Secondary | ICD-10-CM | POA: Insufficient documentation

## 2020-12-27 DIAGNOSIS — G629 Polyneuropathy, unspecified: Secondary | ICD-10-CM | POA: Insufficient documentation

## 2020-12-27 DIAGNOSIS — E2839 Other primary ovarian failure: Secondary | ICD-10-CM | POA: Diagnosis not present

## 2020-12-27 DIAGNOSIS — Z79899 Other long term (current) drug therapy: Secondary | ICD-10-CM | POA: Diagnosis not present

## 2020-12-27 DIAGNOSIS — F419 Anxiety disorder, unspecified: Secondary | ICD-10-CM | POA: Diagnosis not present

## 2020-12-27 DIAGNOSIS — F32A Depression, unspecified: Secondary | ICD-10-CM | POA: Diagnosis not present

## 2020-12-27 DIAGNOSIS — C50212 Malignant neoplasm of upper-inner quadrant of left female breast: Secondary | ICD-10-CM | POA: Insufficient documentation

## 2020-12-27 DIAGNOSIS — Z9079 Acquired absence of other genital organ(s): Secondary | ICD-10-CM | POA: Diagnosis not present

## 2020-12-27 DIAGNOSIS — Z510 Encounter for antineoplastic radiation therapy: Secondary | ICD-10-CM | POA: Diagnosis not present

## 2020-12-27 DIAGNOSIS — I10 Essential (primary) hypertension: Secondary | ICD-10-CM | POA: Diagnosis not present

## 2020-12-27 DIAGNOSIS — Z923 Personal history of irradiation: Secondary | ICD-10-CM | POA: Insufficient documentation

## 2020-12-27 NOTE — Progress Notes (Signed)
E4353, A PROSPECTIVE OBSERVATIONAL COHORT STUDY TO DEVELOP A PREDICTIVE MODEL OF TAXANE-INDUCED PERIPHERAL NEUROPATHY IN CANCER PATIENTS. Week 24 assessments.  Patient presented to the clinic, alone. Patient was met with before her appointment and provided the study questionnaires to complete. Patient reports to be feeling well and confirms to have (grade 1 peripheral sensory neuropathy) mild numbness to her toes, per Dr. Ernestina Penna assessment.   PROs: Questionnaires were given to patient to complete in clinic upon her arrival. Collected questionnaires and checked for completeness and accuracy.  Labs: Patient did not consent to optional research labs.  Physician Assessments: CTCAE and Treatment Burden forms reviewed and completed by Dr. Burr Medico.  History of Falls: Patient denies having any falls in the last 6 months. Supplements, Topical Agents and other treatments: Reviewed with patient, she denies any changes from last study assessment.  Assessment for Interventions for CIPN: Reviewed with patient and CRFs completed. Neuropen Assessment: Completed per protocol by this certified research RN, time recording completed by Clabe Seal, CRC. T[place] Assessment: Completed per protocol by this Film/video editor, time recording completed by Clabe Seal, [place]. Timed Get Up and Go Test: completed, 9.3 seconds.  Plan: Informed patient of next study assessments will be at 52 weeks. Plan for this visit to be approximately in October 2022  Patient denied having any questions at this time. Patient thanked for her time and continued support of tudy and was encouraged to call clinic for any questions or concerns she may have prior to her next appointment. Maxwell Marion, RN, BSN, [facility] Clinical Research 12/27/2020 9:49 AM

## 2020-12-30 ENCOUNTER — Telehealth: Payer: Self-pay | Admitting: Hematology

## 2020-12-30 ENCOUNTER — Ambulatory Visit
Admission: RE | Admit: 2020-12-30 | Discharge: 2020-12-30 | Disposition: A | Discharge status: Home / Self Care | Payer: Medicare Other | Source: Ambulatory Visit | Attending: Radiation Oncology | Admitting: Radiation Oncology

## 2020-12-30 ENCOUNTER — Other Ambulatory Visit: Payer: Self-pay

## 2020-12-30 DIAGNOSIS — C50212 Malignant neoplasm of upper-inner quadrant of left female breast: Secondary | ICD-10-CM | POA: Diagnosis not present

## 2020-12-30 DIAGNOSIS — Z510 Encounter for antineoplastic radiation therapy: Secondary | ICD-10-CM | POA: Diagnosis not present

## 2020-12-30 DIAGNOSIS — Z170 Estrogen receptor positive status [ER+]: Secondary | ICD-10-CM | POA: Diagnosis not present

## 2020-12-30 NOTE — Telephone Encounter (Signed)
Left message with follow-up appointment per 2/25 los. Gave option to call back to reschedule if needed.

## 2020-12-31 ENCOUNTER — Ambulatory Visit
Admission: RE | Admit: 2020-12-31 | Discharge: 2020-12-31 | Disposition: A | Discharge status: Home / Self Care | Payer: Medicare Other | Source: Ambulatory Visit | Attending: Radiation Oncology | Admitting: Radiation Oncology

## 2020-12-31 ENCOUNTER — Other Ambulatory Visit: Payer: Self-pay

## 2020-12-31 ENCOUNTER — Encounter: Payer: Self-pay | Admitting: *Deleted

## 2020-12-31 DIAGNOSIS — C50212 Malignant neoplasm of upper-inner quadrant of left female breast: Secondary | ICD-10-CM | POA: Insufficient documentation

## 2020-12-31 DIAGNOSIS — Z510 Encounter for antineoplastic radiation therapy: Secondary | ICD-10-CM | POA: Diagnosis not present

## 2020-12-31 DIAGNOSIS — Z170 Estrogen receptor positive status [ER+]: Secondary | ICD-10-CM | POA: Insufficient documentation

## 2021-01-01 ENCOUNTER — Ambulatory Visit
Admission: RE | Admit: 2021-01-01 | Discharge: 2021-01-01 | Disposition: A | Discharge status: Home / Self Care | Payer: Medicare Other | Source: Ambulatory Visit | Attending: Radiation Oncology | Admitting: Radiation Oncology

## 2021-01-01 ENCOUNTER — Other Ambulatory Visit: Payer: Self-pay

## 2021-01-01 ENCOUNTER — Encounter: Payer: Self-pay | Admitting: Radiation Oncology

## 2021-01-01 DIAGNOSIS — Z510 Encounter for antineoplastic radiation therapy: Secondary | ICD-10-CM | POA: Diagnosis not present

## 2021-01-01 DIAGNOSIS — C50212 Malignant neoplasm of upper-inner quadrant of left female breast: Secondary | ICD-10-CM | POA: Diagnosis not present

## 2021-01-01 DIAGNOSIS — Z170 Estrogen receptor positive status [ER+]: Secondary | ICD-10-CM | POA: Diagnosis not present

## 2021-01-06 NOTE — Progress Notes (Signed)
  Patient Name: Doris Ellis MRN: [id number] DOB: 10/10/1941 Referring Physician: Lucia Gaskins DAVID (Profile Not Attached) Date of Service: 01/01/2021 [facility], [place]                                                        End Of Treatment Note  Diagnoses: C50.212-Malignant neoplasm of upper-inner quadrant of left female breast  Cancer Staging: Stage IA, pT1cN0M0, grade 2, ER positive invasive ductal carcinoma of the left breast. Dr. Lisbeth Renshaw discusses the final results of pathology and reviews the nature of left breast disease.   Intent: Curative  Radiation Treatment Dates: 12/05/2020 through 01/01/2021 Site Technique Total Dose (Gy) Dose per Fx (Gy) Completed Fx Beam Energies  Breast, Left: Breast_Lt 3D 42.56/42.56 2.66 16/16 6X  Breast, Left: Breast_Lt_Bst 3D 8/8 2 4/4 6X, 10X   Narrative: The patient tolerated radiation therapy relatively well. She developed anticipated treatment changes of the skin in the treatment field.  Plan: The patient will receive a call in about one month from the radiation oncology department. She will continue follow up with Dr. Burr Medico as well.   ________________________________________________    Carola Rhine, [facility]

## 2021-01-16 ENCOUNTER — Other Ambulatory Visit: Payer: Self-pay

## 2021-01-16 ENCOUNTER — Ambulatory Visit (HOSPITAL_COMMUNITY)
Admission: RE | Admit: 2021-01-16 | Discharge: 2021-01-16 | Disposition: A | Discharge status: Home / Self Care | Payer: Medicare Other | Source: Ambulatory Visit | Attending: Hematology | Admitting: Hematology

## 2021-01-16 ENCOUNTER — Other Ambulatory Visit: Payer: Self-pay | Admitting: Hematology

## 2021-01-16 DIAGNOSIS — E2839 Other primary ovarian failure: Secondary | ICD-10-CM | POA: Diagnosis not present

## 2021-01-16 DIAGNOSIS — M85852 Other specified disorders of bone density and structure, left thigh: Secondary | ICD-10-CM | POA: Diagnosis not present

## 2021-01-17 ENCOUNTER — Other Ambulatory Visit: Payer: Self-pay | Admitting: Hematology

## 2021-01-17 MED ORDER — CYCLOBENZAPRINE HCL 10 MG PO TABS: ORAL_TABLET | ORAL | 0 refills | Status: DC

## 2021-01-22 NOTE — Progress Notes (Signed)
[place]   [phone] [phone]   Clinic Follow up Note   Patient Care Team: Asencion Noble, MD as PCP - General (Internal Medicine) Rockwell Germany, RN as Oncology Nurse Navigator Mauro Kaufmann, RN as Oncology Nurse Navigator Alphonsa Overall, MD as Consulting Physician (General Surgery) Kyung Rudd, MD as Consulting Physician (Radiation Oncology) Truitt Merle, MD as Consulting Physician (Hematology) Alla Feeling, NP as Nurse Practitioner (Nurse Practitioner)  Date of Service:  01/24/2021  I connected with Doris Ellis on 01/24/2021 at  3:00 PM EDT by telephone visit and verified that I am speaking with the correct person using two identifiers.  I discussed the limitations, risks, security and privacy concerns of performing an evaluation and management service by telephone and the availability of in person appointments. I also discussed with the patient that there may be a patient responsible charge related to this service. The patient expressed understanding and agreed to proceed.   Other persons participating in the visit and their role in the encounter:  None  Patient's location:  Her home  Provider's location:  My Office   CHIEF COMPLAINT: F/u of left breast cancer  SUMMARY OF ONCOLOGIC HISTORY: Oncology History Overview Note  Cancer Staging Malignant neoplasm of upper-inner quadrant of female breast ([place]) Staging form: Breast, AJCC 8th Edition - Clinical stage from 06/11/2020: Stage IA (cT1b, cN0, cM0, G2, ER+, PR-, HER2: Equivocal) - Signed by Alla Feeling, NP on 07/01/2020 - Pathologic stage from 07/05/2020: Stage IA (pT1c, pN0, cM0, G2, ER+, PR-, HER2-, Oncotype DX score: 33) - Signed by Truitt Merle, MD on 07/29/2020    Malignant neoplasm of upper-inner quadrant of female breast ([place])  05/28/2020 Mammogram   Diagnostic left mammogram/US  -On physical exam, I palpate discrete focal soft thickening in the 10-11 o'clock location of the LEFT  breast. -Targeted ultrasound is performed, showing an irregular taller than wide hypoechoic mass with indistinct margins in the 10 o'clock location of the LEFT breast 8 centimeters from the nipple. There is associated posterior acoustic shadowing. No significant internal blood flow identified on Doppler evaluation.  IMPRESSION: Suspicious mass in the 10 o'clock location of the LEFT breast. No LEFT axillary adenopathy.     06/11/2020 Cancer Staging   Staging form: Breast, AJCC 8th Edition - Clinical stage from 06/11/2020: Stage IA (cT1b, cN0, cM0, G2, ER+, PR-, HER2: Equivocal) - Signed by Alla Feeling, NP on 07/01/2020   06/11/2020 Initial Biopsy   FINAL MICROSCOPIC DIAGNOSIS: A. BREAST, 10:00, LEFT, BIOPSY: - Invasive ductal carcinoma The carcinoma appears Nottingham grade 1-2 of 3 and measures 0.9 cm in greatest linear extent  PROGNOSTIC INDICATOR RESULTS: The tumor cells are EQUIVOCAL for Her2 (2+) Estrogen Receptor: 90%, MODERATE STAINING INTENSITY Progesterone Receptor: NEGATIVE Proliferation Marker Ki-67: 5%  FLOURESCENCE IN-SITU HYBRIDIZATION RESULTS: GROUP 5: HER2 **NEGATIVE** RATIO of HER2/CEN 17 SIGNALS: 1.18 AVERAGE HER2 COPY NUMBER PER CELL: 1.30   07/01/2020 Initial Diagnosis   Malignant neoplasm of upper-inner quadrant of female breast ([place])   07/05/2020 Surgery   LEFT BREAST LUMPECTOMY WITH RADIOACTIVE SEED AND LEFT AXILLARY SENTINEL LYMPH NODE BIOPSY by Dr Lucia Gaskins    07/05/2020 Pathology Results   FINAL MICROSCOPIC DIAGNOSIS:   A. BREAST, LEFT, LUMPECTOMY:  - Invasive ductal carcinoma, 1.8 cm.  - Margins not involved.  - Biopsy site and biopsy clip.  - See oncology table.   B. LYMPH NODE, LEFT AXILLARY, SENTINEL, EXCISION:  - One lymph node with no metastatic carcinoma (0/1).  07/05/2020 Oncotype testing   Oncotype  Recurrence Score 33 Distant recurrence risk at 9 years with Tamoxifen alone is 21% There is >15% benefit of adjuvant chemotherapy.     07/05/2020 Cancer Staging   Staging form: Breast, AJCC 8th Edition - Pathologic stage from 07/05/2020: Stage IA (pT1c, pN0, cM0, G2, ER+, PR-, HER2-, Oncotype DX score: 33) - Signed by Truitt Merle, MD on 07/29/2020   08/09/2020 - 10/24/2020 Chemotherapy   Weekly Taxol for 12 weeks starting 08/09/20-10/24/20   12/06/2020 - 01/01/2021 Radiation Therapy   Adjuvant Radiation with Dr Lisbeth Renshaw    01/2021 -  Anti-estrogen oral therapy   Tamoxifen 35m daily starting in late 01/2021      CURRENT THERAPY:  Tamoxifen 220mdaily starting in late 01/2021  INTERVAL HISTORY:  Doris Ellis scheduled for a phone visit for a follow up. She completed radiation about 3 weeks ago. She notes recent bout of fatigue, taste change and joint pain. She notes her joint pain just started in the last 2 weeks. She notes she has arthritis and takes flexeril. She notes her taste change and fatigue is improving. She notes she took estrogen after her hysterectomy until she as [id number] plans to f/u with her new surgeon May 10th.     REVIEW OF SYSTEMS:   Constitutional: Denies fevers, chills or abnormal weight loss Eyes: Denies blurriness of vision Ears, nose, mouth, throat, and face: Denies mucositis or sore throat Respiratory: Denies cough, dyspnea or wheezes Cardiovascular: Denies palpitation, chest discomfort or lower extremity swelling Gastrointestinal:  Denies nausea, heartburn or change in bowel habits Skin: Denies abnormal skin rashes Lymphatics: Denies new lymphadenopathy or easy bruising Neurological:Denies numbness, tingling or new weaknesses Behavioral/Psych: Mood is stable, no new changes  All other systems were reviewed with the patient and are negative.  MEDICAL HISTORY:  Past Medical History:  Diagnosis Date  . Anxiety   . Arthritis   . [place] ([facility] [place]   left breast IDC  . Concussion 1985  . Depression   . Hyperlipidemia   . Hypertension   . Mild concussion 2002   tripped over bag at work  .  Osteopenia 07/2011   T score -1.8 FRAX 11%/1.5%  . Panic attacks   . Seasonal allergies     SURGICAL HISTORY: Past Surgical History:  Procedure Laterality Date  . ABDOMINAL HYSTERECTOMY  april 1990   TAH BSO  . BACK SURGERY  1996   ruputured disc L-5  . BLADDER SUSPENSION  1982  . BREAST BIOPSY Left   . BREAST LUMPECTOMY WITH RADIOACTIVE SEED AND SENTINEL LYMPH NODE BIOPSY Left 07/05/2020   Procedure: LEFT BREAST LUMPECTOMY WITH RADIOACTIVE SEED AND LEFT AXILLARY SENTINEL LYMPH NODE BIOPSY;  Surgeon: NeAlphonsa OverallMD;  [facility] Service: General;  Laterality: Left;  LEFT BREAST CANCER  . BREAST SURGERY  1984   cyst-benign  . COLONOSCOPY N/A 03/30/2013   Procedure: COLONOSCOPY;  Surgeon: NaRogene HoustonMD;  Location: [facility];  Service: Endoscopy;  Laterality: N/A;  930  . DILATION AND CURETTAGE OF UTERUS  12/09/1988   abnormal uterine bleeding  . IR IMAGING GUIDED PORT INSERTION  08/05/2020    I have reviewed the social history and family history with the patient and they are unchanged from previous note.  ALLERGIES:  is allergic to lipitor [atorvastatin calcium], zocor [simvastatin - high dose], and benadryl [diphenhydramine].  MEDICATIONS:  Current Outpatient Medications  Medication Sig Dispense Refill  . tamoxifen (  NOLVADEX) 20 MG tablet Take 1 tablet (20 mg total) by mouth daily. 30 tablet 3  . ALPRAZolam (XANAX) 1 MG tablet Take 1 mg by mouth at bedtime as needed for sleep.    Marland Kitchen amLODipine (NORVASC) 2.5 MG tablet Take 2.5 mg by mouth daily.    . cyclobenzaprine (FLEXERIL) 10 MG tablet TAKE 1 TABLET(10 MG) BY MOUTH THREE TIMES DAILY AS NEEDED FOR MUSCLE SPASMS 30 tablet 0  . fluticasone (FLONASE) 50 MCG/ACT nasal spray Place into both nostrils daily.    Marland Kitchen lidocaine-prilocaine (EMLA) cream Apply to affected area once 30 g 3  . multivitamin (THERAGRAN) per tablet Take 1 tablet by mouth daily.    . naproxen sodium (ALEVE) 220 MG tablet Take 220  mg by mouth.    . ondansetron (ZOFRAN) 8 MG tablet Take 1 tablet (8 mg total) by mouth 2 (two) times daily as needed (Nausea or vomiting). 30 tablet 1  . pravastatin (PRAVACHOL) 10 MG tablet Take 10 mg by mouth daily.    . prochlorperazine (COMPAZINE) 10 MG tablet Take 1 tablet (10 mg total) by mouth every 6 (six) hours as needed (Nausea or vomiting). 30 tablet 1  . Pyridoxine HCl (VITAMIN B-6 PO) Take 1 tablet by mouth daily.     . sertraline (ZOLOFT) 50 MG tablet Take 50 mg by mouth daily.    . traMADol (ULTRAM) 50 MG tablet Take 1 tablet (50 mg total) by mouth every 6 (six) hours as needed for moderate pain or severe pain. (Patient not taking: Reported on 11/27/2020) 12 tablet 0   No current facility-administered medications for this visit.    PHYSICAL EXAMINATION: ECOG PERFORMANCE STATUS: 1 - Symptomatic but completely ambulatory   No vitals taken today, Exam not performed today  LABORATORY DATA:  I have reviewed the data as listed CBC Latest Ref Rng & Units 10/24/2020 10/18/2020 10/11/2020  WBC 4.0 - 10.5 K/uL 4.6 4.8 5.2  Hemoglobin 12.0 - 15.0 g/dL 11.0(L) 10.8(L) 11.2(L)  Hematocrit 36.0 - 46.0 % 32.9(L) 32.2(L) 34.1(L)  Platelets 150 - 400 K/uL 399 356 365     CMP Latest Ref Rng & Units 10/24/2020 10/18/2020 10/11/2020  Glucose 70 - 99 mg/dL 113(H) 107(H) 108(H)  BUN 8 - 23 mg/dL _0 Creatinine 0.44 - 1.00 mg/dL 0.71 0.70 0.74  Sodium 135 - 145 mmol/L 135 132(L) 137  Potassium 3.5 - 5.1 mmol/L 4.3 4.2 4.2  Chloride 98 - 111 mmol/L 104 101 107  CO2 22 - 32 mmol/L _1 Calcium 8.9 - 10.3 mg/dL 9.4 9.5 9.2  Total Protein 6.5 - 8.1 g/dL 7.3 7.3 7.2  Total Bilirubin 0.3 - 1.2 mg/dL 0.4 0.4 0.4  Alkaline Phos 38 - 126 U/L 43 40 41  AST 15 - 41 U/L 21 32 24  ALT 0 - 44 U/L _2 RADIOGRAPHIC STUDIES: I have personally reviewed the radiological images as listed and agreed with the findings in the report. No results found.   ASSESSMENT & PLAN:  Doris Ellis is a 80 y.o. female with    1.Left breastcancer, grade 1-2, ER+/PR-/HER2- Ki-67 5%,pT1cN0M0, Grade 1,stage IA, RS 33, -She was diagnosed in 06/2020 with grade 1-2 invasive ductal carcinoma of the left breast.  -She underwent left lumpectomy with SLNB with Dr Lucia Gaskins on 07/05/20.Her surgicalpathology showeda 1.8 cm tumor with 1negativelymph node. Surgical margins were negative.HerOncotype Dx testingshowed high risk disease with recurrence score of 33. -She completed  adjuvant Chemo with weekly Taxol 08/09/20-10/24/20 and adjuvant radiation 12/06/20-01/01/21.   -She is still recovering from treatment with fatigue, taste change and joint pain.  -Given the strong ER and PR positivity, I do recommend adjuvant aromatase inhibitor or tamoxifen to reduce her risk of cancer recurrence. I reviewed side effects with her again. Due to her arthralgia, osteopenia, and history of hysterectomy, I recommend tamoxifen.   ---The potential side effects, which includes but not limited to, hot flash, skin and vaginal dryness, slightly increased risk of cardiovascular disease and cataract, were discussed with her in great details. Preventive strategies for thrombosis, such as being physically active, using compression stocks, avoid cigarette smoking, etc., were reviewed with her.  She is agreed to try Tamoxifen. Plan to start in 01/2021 when she recovers better  -Will proceed with survivorship clinic with NP [name] in 3 months. She will f/u with me in 6 months    2.Osteopenia -DEXA on 04/01/2017 shows osteopenia in the right femur neck with T score -1.6.Her FRAX score shows a 16.9% probability of a major osteoporotic fracture in 10 years and 3.1% risk for hip fracture -We encouraged her to begin calcium and vitamin D once daily and continue weightbearing exercise -01/16/21 DEXA shows osteopenia with -1.4 at left hip with higher risk of hip fracture.  -Will monitor on Tamoxifen which can strengthen her  bones.  3. Peripheral neuropathy, grade 1 -She reports mild numbness on his toes since completed chemotherapy, no impact on her walking or function.  No significant neuropathy of her fingers.  This is likely related to Taxol -I encouraged her exercise, and take vitamin B complex  PLAN: -I called in Tamoxifen today. Plan to start in 2-4 weeks -Survivorship clinic with NP [name] in 3 months  -Lab and f/u with me in 6 months     No problem-specific Assessment & Plan notes found for this encounter.   No orders of the defined types were placed in this encounter.  I discussed the assessment and treatment plan with the patient. The patient was provided an opportunity to ask questions and all were answered. The patient agreed with the plan and demonstrated an understanding of the instructions.  The patient was advised to call back or seek an in-person evaluation if the symptoms worsen or if the condition fails to improve as anticipated.  The total time spent in the appointment was 22 minutes.     Truitt Merle, MD 01/24/2021   I, Joslyn Devon, am acting as scribe for Truitt Merle, MD.   I have reviewed the above documentation for accuracy and completeness, and I agree with the above.

## 2021-01-24 ENCOUNTER — Inpatient Hospital Stay: Payer: Medicare Other | Attending: Nurse Practitioner | Admitting: Hematology

## 2021-01-24 ENCOUNTER — Encounter: Payer: Self-pay | Admitting: Hematology

## 2021-01-24 DIAGNOSIS — Z170 Estrogen receptor positive status [ER+]: Secondary | ICD-10-CM

## 2021-01-24 DIAGNOSIS — C50212 Malignant neoplasm of upper-inner quadrant of left female breast: Secondary | ICD-10-CM

## 2021-01-24 MED ORDER — TAMOXIFEN CITRATE 20 MG PO TABS: 20.0000 mg | ORAL_TABLET | Freq: Every day | ORAL | 3 refills | Status: DC

## 2021-01-28 ENCOUNTER — Telehealth: Payer: Self-pay | Admitting: Hematology

## 2021-01-28 NOTE — Telephone Encounter (Signed)
Scheduled follow-up appointments per 3/25 los. Patient is aware.

## 2021-03-10 ENCOUNTER — Ambulatory Visit
Admission: RE | Admit: 2021-03-10 | Discharge: 2021-03-10 | Disposition: A | Discharge status: Home / Self Care | Payer: Medicare Other | Source: Ambulatory Visit | Attending: Radiation Oncology | Admitting: Radiation Oncology

## 2021-03-10 DIAGNOSIS — C50212 Malignant neoplasm of upper-inner quadrant of left female breast: Secondary | ICD-10-CM | POA: Insufficient documentation

## 2021-03-10 DIAGNOSIS — Z170 Estrogen receptor positive status [ER+]: Secondary | ICD-10-CM | POA: Insufficient documentation

## 2021-03-10 NOTE — Progress Notes (Signed)
  Radiation Oncology         [phone] ________________________________  Name: Doris Ellis MRN: [id number]  Date of Service: 03/10/2021  DOB: [birth year]  Post Treatment Telephone Note  Diagnosis:   Stage IA,pT1cN0M0, grade 2, ER positiveinvasive ductal carcinoma of the left breast.  Interval Since Last Radiation:  10 weeks   12/05/2020 through 01/01/2021 Site Technique Total Dose (Gy) Dose per Fx (Gy) Completed Fx Beam Energies  Breast, Left: Breast_Lt 3D 42.56/42.56 2.66 16/16 6X  Breast, Left: Breast_Lt_Bst 3D 8/8 2 4/4 6X, 10X     Narrative:  The patient was contacted today for routine follow-up. During treatment she did very well with radiotherapy and did not have significant desquamation. She reports she is doing well and fatigue is improved but still present.  Impression/Plan: 1. Stage IA,pT1cN0M0, grade 2, ER positiveinvasive ductal carcinoma of the left breast. The patient has been doing well since completion of radiotherapy. We discussed that we would be happy to continue to follow her as needed, but she will also continue to follow up with Dr. Burr Medico in medical oncology. She was counseled on skin care as well as measures to avoid sun exposure to this area.  2. Survivorship. We discussed the importance of survivorship evaluation and encouraged her to attend her upcoming visit with that clinic.     Carola Rhine, PAC

## 2021-03-11 ENCOUNTER — Ambulatory Visit: Payer: Self-pay | Admitting: Surgery

## 2021-03-11 DIAGNOSIS — Z170 Estrogen receptor positive status [ER+]: Secondary | ICD-10-CM | POA: Diagnosis not present

## 2021-03-11 DIAGNOSIS — Z95828 Presence of other vascular implants and grafts: Secondary | ICD-10-CM | POA: Diagnosis not present

## 2021-03-11 DIAGNOSIS — C50912 Malignant neoplasm of unspecified site of left female breast: Secondary | ICD-10-CM | POA: Diagnosis not present

## 2021-03-11 NOTE — H&P (Signed)
Doris Ellis Appointment: 03/11/2021 4:30 PM Location: [name] Patient #: [id number] DOB: 14-May-1941 Divorced / Language: [name] / Race: White Female  History of Present Illness Marcello Moores A. Joylyn Duggin MD; 03/11/2021 5:10 PM) Patient words: The patient is a 80 year old female who presents with a complaint of breast cancer. The PCP is Dr. Asencion Noble     she is doing well. She is done with chemotx.  She is done with her Port-A-Cath as well. She would like to have it removed. She had some fatigue with radiation therapy. That is resolving since she started tamoxifen last month. So far she's had no side effects that she knows of.    Past Medical History: 1. Left breast cancer Oncology - Dr. Burr Medico and [facility] Left breast on 06/11/2020 at 10 o'clock [phone])- IDC, ER - 90%, PR - negative , Ki67 - 5% , Her2Neu - negative left breast lumpectomy - 07/05/2020 - 1.8 cm IDC, margins clear, 0/1 node Ms Widmann's oncotype is 33. She is getting chemotx 2. Colonoscopy - 2014 - [facility] 3. History of anxiety/depression 4. HTN 5. History of head injury in 1985 On Zoloft 6. History of back surgery - 1996 for disk disease 7. History of hysterectomy/bladder tack 8. IR - power port placement - 08/05/2020  Social History: Divorced x 3. She lives in [place]. She has 2 children. Her daughter, 75 yo, lives in [place], [place]. Her granddaughter, Denyse Amass, was with her on the initial visit. 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  The patient is a 79 year old female.   Problem List/Past Medical Sharyn Lull R. Brooks, CMA; 03/11/2021 4:22 PM) VISIT FOR WOUND CHECK (Z51.89) CELLULITIS OF AXILLA, LEFT (L03.112) MALIGNANT NEOPLASM OF LEFT BREAST, STAGE 1, ESTROGEN RECEPTOR POSITIVE (C50.912) Left breast on 06/11/2020 at 10 o'clock [phone])- IDC, ER - 90%, PR - negative , Ki67 - 5% , Her2Neu - negative Left breast lumpectomy  - 07/05/2020 - 1.8 cm IDC, margins clear, 0/1 node Ms Frechette's Oncotype is 33.  Oncology - Burr Medico and [place]  Past Surgical History Sharyn Lull R. Brooks, CMA; 03/11/2021 4:22 PM) Breast Biopsy Left. Breast Mass; Local Excision Left. Cataract Surgery Bilateral. Colon Polyp Removal - Colonoscopy Hysterectomy (due to cancer) - Complete Hysterectomy (not due to cancer) - Complete Spinal Surgery - Lower Back Tonsillectomy  Diagnostic Studies History [name], CMA; 03/11/2021 4:22 PM) Colonoscopy 5-10 years ago Mammogram within last year Pap Smear >5 years ago  Allergies Sharyn Lull R. Brooks, [place]; 03/11/2021 4:22 PM) Benadryl *DERMATOLOGICALS* Rash.  Medication History Sharyn Lull R. Brooks, CMA; 03/11/2021 4:22 PM) amLODIPine Besylate (2.5MG Tablet, Oral) Active. Ondansetron (8MG Tablet, Oral) Active. Prochlorperazine Maleate (5MG Tablet, Oral) Active. ALPRAZolam (0.5MG Tablet, Oral) Active. Pravastatin Sodium (10MG Tablet, Oral) Active. Fluticasone Propionate (50MCG/ACT Suspension, Nasal) Active. Cyclobenzaprine HCl (10MG Tablet, Oral) Active. Medications Reconciled  Social History Sharyn Lull R. Brooks, CMA; 03/11/2021 4:22 PM) Alcohol use Occasional alcohol use. Caffeine use Coffee, Tea. Illicit drug use Prefer to discuss with provider.  Family History [name], CMA; 03/11/2021 4:22 PM) Alcohol Abuse Father. Arthritis Mother. Prostate Cancer Brother.  Pregnancy / Birth History [name], CMA; 03/11/2021 4:22 PM) Age at menarche 30 years. Age of menopause 48-50 Gravida 2 Para 2  Other Problems Sharyn Lull R. Brooks, CMA; 03/11/2021 4:22 PM) Anxiety Disorder Arthritis Back Pain Cancer Depression Hemorrhoids High blood pressure Lump In Breast     Physical Exam (Adhrit Krenz A. Katrena Stehlin MD; 03/11/2021 5:11 PM)  The physical exam findings are  as follows: Note:General: WN older WF who is alert and generally healthy  appearing. HEENT: Normal. Pupils equal.  Neck: Supple. No mass. No thyroid mass.  Lymph Nodes: No supraclavicular or cervical nodes. She has some mild redness of her right axillary incision Note, she got a flu shot in the left arm this week - not sure how much of the change is this.  Lungs: Clear to auscultation and symmetric breath sounds. Heart: RRR. No murmur or rub.  Breasts: Right - no mass or nodule Left - Incision in UIQ of the left breast looks good.  Extremities: Good strength and ROM in upper and lower extremities.  Chest and Lung Exam Note: Port noted right clavicular region    Assessment & Plan (Shantina Chronister A. Christiane Sistare MD; 03/11/2021 5:12 PM)  MALIGNANT NEOPLASM OF LEFT BREAST, STAGE 1, ESTROGEN RECEPTOR POSITIVE (C50.912) Story: Left breast on 06/11/2020 at 10 o'clock [phone])- IDC, ER - 90%, PR - negative , Ki67 - 5% , Her2Neu - negative Left breast lumpectomy - 07/05/2020 - 1.8 cm IDC, margins clear, 0/1 node Ms Borski's Oncotype is 33.  Oncology - Burr Medico and [place] Impression: Doing well Plan for port removal as outpatient.  Current Plans I recommended surgery to remove the catheter. I explained the technique of removal with use of local anesthesia & possible need for more aggressive sedation/anesthesia for patient comfort.  Risks such as bleeding, infection, and other risks were discussed. Post-operative dressing/incision care was discussed. I noted a good likelihood this will help address the problem. We will work to minimize complications. Questions were answered. The patient expresses understanding & wishes to proceed with surgery.   PORT-A-CATH IN PLACE (D80.063) Impression: plan removal in OR

## 2021-03-17 ENCOUNTER — Ambulatory Visit: Payer: Medicare Other

## 2021-04-09 ENCOUNTER — Other Ambulatory Visit: Payer: Self-pay

## 2021-04-09 ENCOUNTER — Encounter (HOSPITAL_BASED_OUTPATIENT_CLINIC_OR_DEPARTMENT_OTHER): Payer: Self-pay | Admitting: Surgery

## 2021-04-14 ENCOUNTER — Other Ambulatory Visit (HOSPITAL_COMMUNITY): Payer: Medicare Other

## 2021-04-14 ENCOUNTER — Other Ambulatory Visit: Payer: Self-pay | Admitting: Hematology

## 2021-04-14 ENCOUNTER — Encounter (HOSPITAL_BASED_OUTPATIENT_CLINIC_OR_DEPARTMENT_OTHER)
Admission: RE | Admit: 2021-04-14 | Discharge: 2021-04-14 | Disposition: A | Discharge status: Home / Self Care | Payer: Medicare Other | Source: Ambulatory Visit | Attending: Surgery | Admitting: Surgery

## 2021-04-14 DIAGNOSIS — Z01812 Encounter for preprocedural laboratory examination: Secondary | ICD-10-CM | POA: Insufficient documentation

## 2021-04-14 LAB — COMPREHENSIVE METABOLIC PANEL
ALT: 10 U/L (ref 0–44)
AST: 27 U/L (ref 15–41)
Albumin: 3.8 g/dL (ref 3.5–5.0)
Alkaline Phosphatase: 30 U/L — ABNORMAL LOW (ref 38–126)
Anion gap: 9 (ref 5–15)
BUN: 14 mg/dL (ref 8–23)
CO2: 25 mmol/L (ref 22–32)
Calcium: 9.9 mg/dL (ref 8.9–10.3)
Chloride: 98 mmol/L (ref 98–111)
Creatinine, Ser: 0.79 mg/dL (ref 0.44–1.00)
GFR, Estimated: >60 mL/min (ref >60)
Glucose, Bld: 109 mg/dL — ABNORMAL HIGH (ref 70–99)
Potassium: 4.6 mmol/L (ref 3.5–5.1)
Sodium: 132 mmol/L — ABNORMAL LOW (ref 135–145)
Total Bilirubin: 0.7 mg/dL (ref 0.3–1.2)
Total Protein: 7.5 g/dL (ref 6.5–8.1)

## 2021-04-14 LAB — CBC WITH DIFFERENTIAL/PLATELET
Abs Immature Granulocytes: 0.03 10*3/uL (ref 0.00–0.07)
Basophils Absolute: 0.1 10*3/uL (ref 0.0–0.1)
Basophils Relative: 1 %
Eosinophils Absolute: 0.1 10*3/uL (ref 0.0–0.5)
Eosinophils Relative: 1 %
HCT: 39.6 % (ref 36.0–46.0)
Hemoglobin: 13.2 g/dL (ref 12.0–15.0)
Immature Granulocytes: 0 %
Lymphocytes Relative: 17 %
Lymphs Abs: 1.2 10*3/uL (ref 0.7–4.0)
MCH: 30.7 pg (ref 26.0–34.0)
MCHC: 33.3 g/dL (ref 30.0–36.0)
MCV: 92.1 fL (ref 80.0–100.0)
Monocytes Absolute: 0.4 10*3/uL (ref 0.1–1.0)
Monocytes Relative: 6 %
Neutro Abs: 5.1 10*3/uL (ref 1.7–7.7)
Neutrophils Relative %: 75 %
Platelets: 187 10*3/uL (ref 150–400)
RBC: 4.3 MIL/uL (ref 3.87–5.11)
RDW: 13.9 % (ref 11.5–15.5)
WBC: 6.9 10*3/uL (ref 4.0–10.5)
nRBC: 0 % (ref 0.0–0.2)

## 2021-04-14 NOTE — Progress Notes (Signed)

## 2021-04-17 ENCOUNTER — Ambulatory Visit (HOSPITAL_BASED_OUTPATIENT_CLINIC_OR_DEPARTMENT_OTHER)
Admission: RE | Admit: 2021-04-17 | Discharge: 2021-04-17 | Disposition: A | Discharge status: Home / Self Care | Payer: Medicare Other | Attending: Surgery | Admitting: Surgery

## 2021-04-17 ENCOUNTER — Ambulatory Visit (HOSPITAL_BASED_OUTPATIENT_CLINIC_OR_DEPARTMENT_OTHER): Payer: Medicare Other | Admitting: Anesthesiology

## 2021-04-17 ENCOUNTER — Other Ambulatory Visit: Payer: Self-pay

## 2021-04-17 ENCOUNTER — Encounter (HOSPITAL_BASED_OUTPATIENT_CLINIC_OR_DEPARTMENT_OTHER): Payer: Self-pay | Admitting: Surgery

## 2021-04-17 ENCOUNTER — Encounter (HOSPITAL_BASED_OUTPATIENT_CLINIC_OR_DEPARTMENT_OTHER)
Admission: RE | Disposition: A | Discharge status: Home / Self Care | Payer: Self-pay | Source: Home / Self Care | Attending: Surgery

## 2021-04-17 DIAGNOSIS — Z923 Personal history of irradiation: Secondary | ICD-10-CM | POA: Insufficient documentation

## 2021-04-17 DIAGNOSIS — C50912 Malignant neoplasm of unspecified site of left female breast: Secondary | ICD-10-CM | POA: Diagnosis not present

## 2021-04-17 DIAGNOSIS — Z888 Allergy status to other drugs, medicaments and biological substances status: Secondary | ICD-10-CM | POA: Diagnosis not present

## 2021-04-17 DIAGNOSIS — Z452 Encounter for adjustment and management of vascular access device: Secondary | ICD-10-CM | POA: Diagnosis not present

## 2021-04-17 DIAGNOSIS — Z79899 Other long term (current) drug therapy: Secondary | ICD-10-CM | POA: Insufficient documentation

## 2021-04-17 DIAGNOSIS — Z79810 Long term (current) use of selective estrogen receptor modulators (SERMs): Secondary | ICD-10-CM | POA: Diagnosis not present

## 2021-04-17 DIAGNOSIS — Z170 Estrogen receptor positive status [ER+]: Secondary | ICD-10-CM | POA: Diagnosis not present

## 2021-04-17 DIAGNOSIS — I10 Essential (primary) hypertension: Secondary | ICD-10-CM | POA: Diagnosis not present

## 2021-04-17 DIAGNOSIS — E785 Hyperlipidemia, unspecified: Secondary | ICD-10-CM | POA: Diagnosis not present

## 2021-04-17 HISTORY — PX: PORT-A-CATH REMOVAL: SHX5289

## 2021-04-17 SURGERY — REMOVAL PORT-A-CATH
Anesthesia: Monitor Anesthesia Care | Site: Chest | Laterality: Right

## 2021-04-17 MED ORDER — OXYCODONE HCL 5 MG/5ML PO SOLN: 5.0000 mg | Freq: Once | ORAL | Status: DC | PRN

## 2021-04-17 MED ORDER — ONDANSETRON HCL 4 MG/2ML IJ SOLN: 4.0000 mg | Freq: Once | INTRAMUSCULAR | Status: DC | PRN

## 2021-04-17 MED ORDER — ACETAMINOPHEN 160 MG/5ML PO SOLN: 325.0000 mg | ORAL | Status: DC | PRN

## 2021-04-17 MED ORDER — CEFAZOLIN SODIUM-DEXTROSE 2-4 GM/100ML-% IV SOLN
INTRAVENOUS | Status: AC
  Filled 2021-04-17: qty 100

## 2021-04-17 MED ORDER — ONDANSETRON HCL 4 MG/2ML IJ SOLN
INTRAMUSCULAR | Status: AC
  Filled 2021-04-17: qty 2

## 2021-04-17 MED ORDER — MIDAZOLAM HCL 2 MG/2ML IJ SOLN
INTRAMUSCULAR | Status: AC
  Filled 2021-04-17: qty 2

## 2021-04-17 MED ORDER — FENTANYL CITRATE (PF) 100 MCG/2ML IJ SOLN
INTRAMUSCULAR | Status: DC | PRN
  Administered 2021-04-17: 50 ug via INTRAVENOUS

## 2021-04-17 MED ORDER — ONDANSETRON HCL 4 MG/2ML IJ SOLN
INTRAMUSCULAR | Status: DC | PRN
  Administered 2021-04-17: 4 mg via INTRAVENOUS

## 2021-04-17 MED ORDER — ACETAMINOPHEN 500 MG PO TABS
1000.0000 mg | ORAL_TABLET | ORAL | Status: AC
  Administered 2021-04-17: 1000 mg via ORAL

## 2021-04-17 MED ORDER — ACETAMINOPHEN 325 MG PO TABS: 325.0000 mg | ORAL_TABLET | ORAL | Status: DC | PRN

## 2021-04-17 MED ORDER — FENTANYL CITRATE (PF) 100 MCG/2ML IJ SOLN: 25.0000 ug | INTRAMUSCULAR | Status: DC | PRN

## 2021-04-17 MED ORDER — FENTANYL CITRATE (PF) 100 MCG/2ML IJ SOLN
INTRAMUSCULAR | Status: AC
  Filled 2021-04-17: qty 2

## 2021-04-17 MED ORDER — CHLORHEXIDINE GLUCONATE CLOTH 2 % EX PADS: 6.0000 | MEDICATED_PAD | Freq: Once | CUTANEOUS | Status: DC

## 2021-04-17 MED ORDER — BUPIVACAINE-EPINEPHRINE 0.25% -1:200000 IJ SOLN
INTRAMUSCULAR | Status: DC | PRN
  Administered 2021-04-17: 10 mL

## 2021-04-17 MED ORDER — PROPOFOL 10 MG/ML IV BOLUS
INTRAVENOUS | Status: DC | PRN
  Administered 2021-04-17 (×2): 20 mg via INTRAVENOUS

## 2021-04-17 MED ORDER — IBUPROFEN 800 MG PO TABS: 800.0000 mg | ORAL_TABLET | Freq: Three times a day (TID) | ORAL | 0 refills | Status: DC | PRN

## 2021-04-17 MED ORDER — ACETAMINOPHEN 500 MG PO TABS
ORAL_TABLET | ORAL | Status: AC
  Filled 2021-04-17: qty 2

## 2021-04-17 MED ORDER — CEFAZOLIN SODIUM-DEXTROSE 2-4 GM/100ML-% IV SOLN
2.0000 g | INTRAVENOUS | Status: AC
  Administered 2021-04-17: 2 g via INTRAVENOUS

## 2021-04-17 MED ORDER — MIDAZOLAM HCL 5 MG/5ML IJ SOLN
INTRAMUSCULAR | Status: DC | PRN
  Administered 2021-04-17: 2 mg via INTRAVENOUS

## 2021-04-17 MED ORDER — OXYCODONE HCL 5 MG PO TABS: 5.0000 mg | ORAL_TABLET | Freq: Once | ORAL | Status: DC | PRN

## 2021-04-17 MED ORDER — PROPOFOL 500 MG/50ML IV EMUL
INTRAVENOUS | Status: AC
  Filled 2021-04-17: qty 50

## 2021-04-17 MED ORDER — LACTATED RINGERS IV SOLN: INTRAVENOUS | Status: DC

## 2021-04-17 SURGICAL SUPPLY — 39 items
ADH SKN CLS APL DERMABOND .7 (GAUZE/BANDAGES/DRESSINGS) ×1
APL PRP STRL LF DISP 70% ISPRP (MISCELLANEOUS) ×1
APL SKNCLS STERI-STRIP NONHPOA (GAUZE/BANDAGES/DRESSINGS)
BENZOIN TINCTURE PRP APPL 2/3 (GAUZE/BANDAGES/DRESSINGS) IMPLANT
BLADE SURG 15 STRL LF DISP TIS (BLADE) ×1 IMPLANT
BLADE SURG 15 STRL SS (BLADE) ×3
CHLORAPREP W/TINT 26 (MISCELLANEOUS) ×3 IMPLANT
CLOSURE WOUND 1/2 X4 (GAUZE/BANDAGES/DRESSINGS)
COVER BACK TABLE 60X90IN (DRAPES) ×3 IMPLANT
COVER MAYO STAND STRL (DRAPES) ×3 IMPLANT
DECANTER SPIKE VIAL GLASS SM (MISCELLANEOUS) ×3 IMPLANT
DERMABOND ADVANCED (GAUZE/BANDAGES/DRESSINGS) ×2
DERMABOND ADVANCED .7 DNX12 (GAUZE/BANDAGES/DRESSINGS) ×1 IMPLANT
DRAPE LAPAROTOMY 100X72 PEDS (DRAPES) ×3 IMPLANT
DRAPE UTILITY XL STRL (DRAPES) ×3 IMPLANT
ELECT REM PT RETURN 9FT ADLT (ELECTROSURGICAL) ×3
ELECTRODE REM PT RTRN 9FT ADLT (ELECTROSURGICAL) ×1 IMPLANT
GLOVE SRG 8 PF TXTR STRL LF DI (GLOVE) ×1 IMPLANT
GLOVE SURG LTX SZ8 (GLOVE) ×3 IMPLANT
GLOVE SURG POLYISO LF SZ6.5 (GLOVE) ×2 IMPLANT
GLOVE SURG UNDER POLY LF SZ6.5 (GLOVE) ×2 IMPLANT
GLOVE SURG UNDER POLY LF SZ7 (GLOVE) ×2 IMPLANT
GLOVE SURG UNDER POLY LF SZ8 (GLOVE) ×3
GOWN STRL REUS W/ TWL LRG LVL3 (GOWN DISPOSABLE) ×2 IMPLANT
GOWN STRL REUS W/ TWL XL LVL3 (GOWN DISPOSABLE) ×1 IMPLANT
GOWN STRL REUS W/TWL LRG LVL3 (GOWN DISPOSABLE) ×6
GOWN STRL REUS W/TWL XL LVL3 (GOWN DISPOSABLE) ×3
NDL HYPO 25X1 1.5 SAFETY (NEEDLE) ×1 IMPLANT
NEEDLE HYPO 25X1 1.5 SAFETY (NEEDLE) ×3 IMPLANT
NS IRRIG 1000ML POUR BTL (IV SOLUTION) ×3 IMPLANT
PACK BASIN DAY SURGERY FS (CUSTOM PROCEDURE TRAY) ×3 IMPLANT
PENCIL SMOKE EVACUATOR (MISCELLANEOUS) IMPLANT
SLEEVE SCD COMPRESS KNEE MED (STOCKING) IMPLANT
SPONGE LAP 4X18 RFD (DISPOSABLE) ×3 IMPLANT
STRIP CLOSURE SKIN 1/2X4 (GAUZE/BANDAGES/DRESSINGS) IMPLANT
SUT MON AB 4-0 PC3 18 (SUTURE) ×3 IMPLANT
SUT VICRYL 3-0 CR8 SH (SUTURE) ×3 IMPLANT
SYR CONTROL 10ML LL (SYRINGE) ×3 IMPLANT
TOWEL GREEN STERILE FF (TOWEL DISPOSABLE) ×3 IMPLANT

## 2021-04-17 NOTE — Transfer of Care (Signed)
Immediate Anesthesia Transfer of Care Note  Patient: Doris Ellis  Procedure(s) Performed: REMOVAL PORT-A-CATH (Right: Chest)  Patient Location: PACU   Anesthesia Type:MAC  Level of Consciousness: awake  Airway & Oxygen Therapy: Patient Spontanous Breathing and Patient connected to face mask oxygen  Post-op Assessment: Report given to RN and Post -op Vital signs reviewed and stable  Post vital signs: Reviewed and stable  Last Vitals:  Vitals Value Taken Time  BP    Temp    Pulse 66 04/17/21 1128  Resp 12 04/17/21 1128  SpO2 86 % 04/17/21 1128  Vitals shown include unvalidated device data.  Last Pain:  Vitals:   04/17/21 1002  TempSrc: Oral  PainSc: 0-No pain         Complications: No notable events documented.

## 2021-04-17 NOTE — Discharge Instructions (Addendum)
GENERAL SURGERY: POST OP INSTRUCTIONS  ######################################################################  EAT Gradually transition to a high fiber diet with a fiber supplement over the next few weeks after discharge.  Start with a pureed / full liquid diet (see below)  WALK Walk an hour a day.  Control your pain to do that.    CONTROL PAIN Control pain so that you can walk, sleep, tolerate sneezing/coughing, go up/down stairs.  HAVE A BOWEL MOVEMENT DAILY Keep your bowels regular to avoid problems.  OK to try a laxative to override constipation.  OK to use an antidairrheal to slow down diarrhea.  Call if not better after 2 tries  CALL IF YOU HAVE PROBLEMS/CONCERNS Call if you are still struggling despite following these instructions. Call if you have concerns not answered by these instructions  ######################################################################    DIET: Follow a light bland diet & liquids the first 24 hours after arrival home, such as soup, liquids, starches, etc.  Be sure to drink plenty of fluids.  Quickly advance to a usual solid diet within a few days.  Avoid fast food or heavy meals as your are more likely to get nauseated or have irregular bowels.  A low-fat, high-fiber diet for the rest of your life is ideal.   Take your usually prescribed home medications unless otherwise directed. PAIN CONTROL: Pain is best controlled by a usual combination of three different methods TOGETHER: Ice/Heat Over the counter pain medication Prescription pain medication Most patients will experience some swelling and bruising around the incisions.  Ice packs or heating pads (30-60 minutes up to 6 times a day) will help. Use ice for the first few days to help decrease swelling and bruising, then switch to heat to help relax tight/sore spots and speed recovery.  Some people prefer to use ice alone, heat alone, alternating between ice & heat.  Experiment to what works for you.   Swelling and bruising can take several weeks to resolve.   It is helpful to take an over-the-counter pain medication regularly for the first few weeks.  Choose one of the following that works best for you: Naproxen (Aleve, etc)  Two 220mg tabs twice a day Ibuprofen (Advil, etc) Three 200mg tabs four times a day (every meal & bedtime) Acetaminophen (Tylenol, etc) 500-650mg four times a day (every meal & bedtime) A  prescription for pain medication (such as oxycodone, hydrocodone, etc) should be given to you upon discharge.  Take your pain medication as prescribed.  If you are having problems/concerns with the prescription medicine (does not control pain, nausea, vomiting, rash, itching, etc), please call us [phone] to see if we need to switch you to a different pain medicine that will work better for you and/or control your side effect better. If you need a refill on your pain medication, please contact your pharmacy.  They will contact our office to request authorization. Prescriptions will not be filled after 5 pm or on week-ends. Avoid getting constipated.  Between the surgery and the pain medications, it is common to experience some constipation.  Increasing fluid intake and taking a fiber supplement (such as Metamucil, Citrucel, FiberCon, MiraLax, etc) 1-2 times a day regularly will usually help prevent this problem from occurring.  A mild laxative (prune juice, Milk of Magnesia, MiraLax, etc) should be taken according to package directions if there are no bowel movements after 48 hours.   Wash / shower every day.  You may shower over the dressings as they are waterproof.  Continue to   shower over incision(s) after the dressing is off. Remove your waterproof bandages 5 days after surgery.  You may leave the incision open to air.  You may have skin tapes (Steri Strips) covering the incision(s).  Leave them on until one week, then remove.  You may replace a dressing/Band-Aid to cover the incision  for comfort if you wish.      ACTIVITIES as tolerated:   You may resume regular (light) daily activities beginning the next day--such as daily self-care, walking, climbing stairs--gradually increasing activities as tolerated.  If you can walk 30 minutes without difficulty, it is safe to try more intense activity such as jogging, treadmill, bicycling, low-impact aerobics, swimming, etc. Save the most intensive and strenuous activity for last such as sit-ups, heavy lifting, contact sports, etc  Refrain from any heavy lifting or straining until you are off narcotics for pain control.   DO NOT PUSH THROUGH PAIN.  Let pain be your guide: If it hurts to do something, don't do it.  Pain is your body warning you to avoid that activity for another week until the pain goes down. You may drive when you are no longer taking prescription pain medication, you can comfortably wear a seatbelt, and you can safely maneuver your car and apply brakes. You may have sexual intercourse when it is comfortable.  FOLLOW UP in our office Please call [facility] at [phone] to set up an appointment to see your surgeon in the office for a follow-up appointment approximately 2-3 weeks after your surgery. Make sure that you call for this appointment the day you arrive home to insure a convenient appointment time. 9. IF YOU HAVE DISABILITY OR FAMILY LEAVE FORMS, BRING THEM TO THE OFFICE FOR PROCESSING.  DO NOT GIVE THEM TO YOUR DOCTOR.   WHEN TO CALL us [phone]: Poor pain control Reactions / problems with new medications (rash/itching, nausea, etc)  Fever over 101.5 F (38.5 C) Worsening swelling or bruising Continued bleeding from incision. Increased pain, redness, or drainage from the incision Difficulty breathing / swallowing   The clinic staff is available to answer your questions during regular business hours (8:30am-5pm).  Please don't hesitate to call and ask to speak to one of our nurses for clinical concerns.    If you have a medical emergency, go to the nearest emergency room or call 911.  A surgeon from [facility] is always on call at the [facility], [place], [place], [place],   [address] ? MAIN: [phone] ? TOLL FREE: [phone] ?  FAX [phone] [email or web address]   No Tylenol until 4:05 pm   Post Anesthesia Home Care Instructions  Activity: Get plenty of rest for the remainder of the day. A responsible individual must stay with you for 24 hours following the procedure.  For the next 24 hours, DO NOT: -Drive a car -Paediatric nurse -Drink alcoholic beverages -Take any medication unless instructed by your physician -Make any legal decisions or sign important papers.  Meals: Start with liquid foods such as gelatin or soup. Progress to regular foods as tolerated. Avoid greasy, spicy, heavy foods. If nausea and/or vomiting occur, drink only clear liquids until the nausea and/or vomiting subsides. Call your physician if vomiting continues.  Special Instructions/Symptoms: Your throat may feel dry or sore from the anesthesia or the breathing tube placed in your throat during surgery. If this causes discomfort, gargle with warm salt water. The discomfort should disappear within  24 hours.  If you had a scopolamine patch placed behind your ear for the management of post- operative nausea and/or vomiting:  1. The medication in the patch is effective for 72 hours, after which it should be removed.  Wrap patch in a tissue and discard in the trash. Wash hands thoroughly with soap and water. 2. You may remove the patch earlier than 72 hours if you experience unpleasant side effects which may include dry mouth, dizziness or visual disturbances. 3. Avoid touching the patch. Wash your hands with soap and water after contact with the patch.

## 2021-04-17 NOTE — Op Note (Signed)

## 2021-04-17 NOTE — H&P (Signed)
Doris Ellis Location: [facility] Patient #: [id number] DOB: [birth year] Divorced / Language: Doris Ellis / Race: White Female   History of Present Illness (Patient words: The patient is a 80 year old female who presents with a complaint of breast cancer. The PCP is Dr. Asencion Noble         she is doing well.  She is done with  chemotx.      She is done with her Port-A-Cath as well.  She would like to have it removed.  She had some fatigue with radiation therapy.  That is resolving since she started tamoxifen last month.  So far she's had no side effects that she knows of.       Past Medical History: 1.  Left breast cancer      Oncology - Dr. Burr Medico and [facility]       Left breast on 06/11/2020 at 10 o'clock [phone])- IDC, ER - 90%,  PR - negative , Ki67 - 5% , Her2Neu - negative      left breast lumpectomy - 07/05/2020 - 1.8 cm IDC, margins clear, 0/1 node      [name]'s oncotype is 33.      She is getting chemotx 2.  Colonoscopy - 2014 - [facility] 3.  History of anxiety/depression 4.  HTN 5.  History of head injury in 1985     On Zoloft 6.  History of back surgery - 1996 for disk disease 7.  History of hysterectomy/bladder tack 8.  IR - power port placement - 08/05/2020   Social History: Divorced x 3.   She lives in [place].    She has 2 children.  Her daughter, 24 yo, lives in [place], [place].    Her granddaughter, Denyse Amass, was with her on the initial visit.    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   The patient is a 80 year old female.     Problem List/Past Medical  4:22 PM) VISIT FOR WOUND CHECK (Z51.89)  CELLULITIS OF AXILLA, LEFT (L03.112)  MALIGNANT NEOPLASM OF LEFT BREAST, STAGE 1, ESTROGEN RECEPTOR POSITIVE (C50.912)  Left breast on 06/11/2020 at 10 o'clock [phone])- IDC, ER - 90%, PR - negative , Ki67 - 5% , Her2Neu - negative Left breast lumpectomy - 07/05/2020 - 1.8 cm IDC, margins clear, 0/1 node [name]'s Oncotype is 33.   Oncology - Burr Medico  and [place]   Past Surgical History PM) Breast Biopsy  Left. Breast Mass; Local Excision  Left. Cataract Surgery  Bilateral. Colon Polyp Removal - Colonoscopy  Hysterectomy (due to cancer) - Complete  Hysterectomy (not due to cancer) - Complete  Spinal Surgery - Lower Back  Tonsillectomy    Diagnostic Studies History [name], CMA; 03/11/2021 4:22 PM) Colonoscopy  5-10 years ago Mammogram  within last year Pap Smear  >5 years ago   Allergies  Benadryl *DERMATOLOGICALS*  Rash.   Medication History ) amLODIPine Besylate  (2.5MG Tablet, Oral) Active. Ondansetron  (8MG Tablet, Oral) Active. Prochlorperazine Maleate  (5MG Tablet, Oral) Active. ALPRAZolam  (0.5MG Tablet, Oral) Active. Pravastatin Sodium  (10MG Tablet, Oral) Active. Fluticasone Propionate  (50MCG/ACT Suspension, Nasal) Active. Cyclobenzaprine HCl  (10MG Tablet, Oral) Active. Medications Reconciled    Social History ( Alcohol use  Occasional alcohol use. Caffeine use  Coffee, Tea. Illicit drug use  Prefer to discuss with provider.   Family History () Alcohol Abuse  Father. Arthritis  Mother. Prostate Cancer  Brother.   Pregnancy /  Birth History (2 Age at menarche  67 years. Age of menopause  55-50 Gravida  2 Para  2   Other Problems ( Anxiety Disorder  Arthritis  Back Pain  Cancer  Depression  Hemorrhoids  High blood pressure  Lump In Breast          Physical Exam )   The physical exam findings are as follows: Note: General:  WN older WF who is alert and generally healthy appearing. HEENT: Normal. Pupils equal.   Neck: Supple. No mass.  No thyroid mass.   Lymph Nodes:  No supraclavicular or cervical nodes.      She has some mild redness of her right axillary incision      Note, she got a flu shot in the left arm this week - not sure how much of the change is this.   Lungs: Clear to auscultation and symmetric breath sounds. Heart:  RRR. No murmur or rub.   Breasts:  Right - no  mass or nodule      Left - Incision in UIQ of the left breast looks good.   Extremities:  Good strength and ROM  in upper and lower extremities.   Chest and Lung Exam Note:  Port noted right clavicular region       Assessment & Plan MALIGNANT NEOPLASM OF LEFT BREAST, STAGE 1, ESTROGEN RECEPTOR POSITIVE (C50.912) Story: Left breast on 06/11/2020 at 10 o'clock [phone])- IDC, ER - 90%, PR - negative , Ki67 - 5% , Her2Neu - negative Left breast lumpectomy - 07/05/2020 - 1.8 cm IDC, margins clear, 0/1 node Ms Rase's Oncotype is 33.   Oncology - Burr Medico and [place] Impression: Doing well Plan for port removal as outpatient.   Current Plans I recommended surgery to remove the catheter.  I explained the technique of removal with use of local anesthesia & possible need for more aggressive sedation/anesthesia for patient comfort.    Risks such as bleeding, infection, and other risks were discussed.  Post-operative dressing/incision care was discussed.  I noted a good likelihood this will help address the problem.   We will work to minimize complications. Questions were answered.  The patient expresses understanding & wishes to proceed with surgery.     PORT-A-CATH IN PLACE (C12.751) Impression: plan removal in OR

## 2021-04-17 NOTE — Anesthesia Postprocedure Evaluation (Signed)
Anesthesia Post Note  Patient: Doris Ellis  Procedure(s) Performed: REMOVAL PORT-A-CATH (Right: Chest)     Patient location during evaluation: Phase II Anesthesia Type: MAC Level of consciousness: awake Pain management: pain level controlled Vital Signs Assessment: post-procedure vital signs reviewed and stable Respiratory status: spontaneous breathing Cardiovascular status: stable Postop Assessment: no apparent nausea or vomiting Anesthetic complications: no   No notable events documented.  Last Vitals:  Vitals:   04/17/21 1129 04/17/21 1130  BP: 112/60 (!) 93/46  Pulse: 66 (!) 58  Resp: 12 18  Temp: 36.6 C   SpO2: 100% 100%    Last Pain:  Vitals:   04/17/21 1130  TempSrc:   PainSc: 0-No pain                 Huston Foley

## 2021-04-17 NOTE — Anesthesia Preprocedure Evaluation (Signed)
Anesthesia Evaluation  Patient identified by MRN, date of birth, ID band Patient awake    Reviewed: Allergy & Precautions, NPO status , Patient's Chart, lab work & pertinent test results  Airway Mallampati: I       Dental   Pulmonary neg pulmonary ROS,    Pulmonary exam normal        Cardiovascular hypertension, Pt. on medications Normal cardiovascular exam     Neuro/Psych PSYCHIATRIC DISORDERS Anxiety Depression negative neurological ROS     GI/Hepatic negative GI ROS, Neg liver ROS,   Endo/Other  negative endocrine ROS  Renal/GU negative Renal ROS  negative genitourinary   Musculoskeletal   Abdominal Normal abdominal exam  (+)   Peds  Hematology negative hematology ROS (+)   Anesthesia Other Findings   Reproductive/Obstetrics                             Anesthesia Physical Anesthesia Plan  ASA: 2  Anesthesia Plan: MAC   Post-op Pain Management:    Induction:   PONV Risk Score and Plan: 2 and Propofol infusion and TIVA  Airway Management Planned: Natural Airway, Simple Face Mask and Nasal Cannula  Additional Equipment: None  Intra-op Plan:   Post-operative Plan:   Informed Consent: I have reviewed the patients History and Physical, chart, labs and discussed the procedure including the risks, benefits and alternatives for the proposed anesthesia with the patient or authorized representative who has indicated his/her understanding and acceptance.       Plan Discussed with: CRNA  Anesthesia Plan Comments:         Anesthesia Quick Evaluation

## 2021-04-18 ENCOUNTER — Other Ambulatory Visit: Payer: Self-pay | Admitting: Hematology

## 2021-04-18 MED ORDER — CYCLOBENZAPRINE HCL 10 MG PO TABS: ORAL_TABLET | ORAL | 0 refills | Status: AC

## 2021-04-20 ENCOUNTER — Encounter (HOSPITAL_BASED_OUTPATIENT_CLINIC_OR_DEPARTMENT_OTHER): Payer: Self-pay | Admitting: Surgery

## 2021-04-23 ENCOUNTER — Other Ambulatory Visit: Payer: Self-pay | Admitting: Hematology

## 2021-04-28 ENCOUNTER — Encounter: Payer: Self-pay | Admitting: Nurse Practitioner

## 2021-04-28 ENCOUNTER — Inpatient Hospital Stay: Payer: Medicare Other | Attending: Nurse Practitioner | Admitting: Nurse Practitioner

## 2021-04-28 ENCOUNTER — Other Ambulatory Visit: Payer: Self-pay

## 2021-04-28 VITALS — BP 148/65 | HR 61 | Temp 96.5°F | Resp 18 | Ht 64.0 in | Wt 133.0 lb

## 2021-04-28 DIAGNOSIS — Z79810 Long term (current) use of selective estrogen receptor modulators (SERMs): Secondary | ICD-10-CM | POA: Insufficient documentation

## 2021-04-28 DIAGNOSIS — M8580 Other specified disorders of bone density and structure, unspecified site: Secondary | ICD-10-CM | POA: Insufficient documentation

## 2021-04-28 DIAGNOSIS — I10 Essential (primary) hypertension: Secondary | ICD-10-CM | POA: Insufficient documentation

## 2021-04-28 DIAGNOSIS — Z170 Estrogen receptor positive status [ER+]: Secondary | ICD-10-CM | POA: Insufficient documentation

## 2021-04-28 DIAGNOSIS — Z79899 Other long term (current) drug therapy: Secondary | ICD-10-CM | POA: Diagnosis not present

## 2021-04-28 DIAGNOSIS — E785 Hyperlipidemia, unspecified: Secondary | ICD-10-CM | POA: Insufficient documentation

## 2021-04-28 DIAGNOSIS — C50212 Malignant neoplasm of upper-inner quadrant of left female breast: Secondary | ICD-10-CM | POA: Diagnosis not present
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
## 2021-05-06 DIAGNOSIS — R7303 Prediabetes: Secondary | ICD-10-CM | POA: Diagnosis not present

## 2021-05-06 DIAGNOSIS — I10 Essential (primary) hypertension: Secondary | ICD-10-CM | POA: Diagnosis not present

## 2021-05-06 DIAGNOSIS — E785 Hyperlipidemia, unspecified: Secondary | ICD-10-CM | POA: Diagnosis not present

## 2021-05-13 DIAGNOSIS — Z6824 Body mass index (BMI) 24.0-24.9, adult: Secondary | ICD-10-CM | POA: Diagnosis not present

## 2021-05-13 DIAGNOSIS — I10 Essential (primary) hypertension: Secondary | ICD-10-CM | POA: Diagnosis not present

## 2021-05-13 DIAGNOSIS — Z853 Personal history of malignant neoplasm of breast: Secondary | ICD-10-CM | POA: Diagnosis not present

## 2021-05-13 DIAGNOSIS — E785 Hyperlipidemia, unspecified: Secondary | ICD-10-CM | POA: Diagnosis not present

## 2021-05-15 ENCOUNTER — Encounter (INDEPENDENT_AMBULATORY_CARE_PROVIDER_SITE_OTHER): Payer: Self-pay | Admitting: *Deleted

## 2021-05-21 ENCOUNTER — Ambulatory Visit
Admission: RE | Admit: 2021-05-21 | Discharge: 2021-05-21 | Disposition: A | Discharge status: Home / Self Care | Payer: Medicare Other | Source: Ambulatory Visit | Attending: Nurse Practitioner | Admitting: Nurse Practitioner

## 2021-05-21 ENCOUNTER — Other Ambulatory Visit: Payer: Self-pay

## 2021-05-21 DIAGNOSIS — R922 Inconclusive mammogram: Secondary | ICD-10-CM | POA: Diagnosis not present

## 2021-05-21 DIAGNOSIS — Z170 Estrogen receptor positive status [ER+]: Secondary | ICD-10-CM

## 2021-05-21 HISTORY — DX: Personal history of antineoplastic chemotherapy: Z92.21

## 2021-05-21 HISTORY — DX: Personal history of irradiation: Z92.3

## 2021-07-28 ENCOUNTER — Inpatient Hospital Stay: Payer: Medicare Other | Attending: Nurse Practitioner | Admitting: Hematology

## 2021-07-28 ENCOUNTER — Other Ambulatory Visit: Payer: Self-pay

## 2021-07-28 ENCOUNTER — Encounter: Payer: Self-pay | Admitting: Hematology

## 2021-07-28 ENCOUNTER — Encounter: Payer: Self-pay | Admitting: *Deleted

## 2021-07-28 ENCOUNTER — Inpatient Hospital Stay: Payer: Medicare Other

## 2021-07-28 VITALS — BP 136/61 | HR 67 | Temp 98.1°F | Resp 18 | Ht 64.0 in | Wt 137.9 lb

## 2021-07-28 DIAGNOSIS — I10 Essential (primary) hypertension: Secondary | ICD-10-CM | POA: Diagnosis not present

## 2021-07-28 DIAGNOSIS — M8580 Other specified disorders of bone density and structure, unspecified site: Secondary | ICD-10-CM | POA: Diagnosis not present

## 2021-07-28 DIAGNOSIS — Z923 Personal history of irradiation: Secondary | ICD-10-CM | POA: Diagnosis not present

## 2021-07-28 DIAGNOSIS — Z79899 Other long term (current) drug therapy: Secondary | ICD-10-CM | POA: Diagnosis not present

## 2021-07-28 DIAGNOSIS — Z9221 Personal history of antineoplastic chemotherapy: Secondary | ICD-10-CM | POA: Diagnosis not present

## 2021-07-28 DIAGNOSIS — Z170 Estrogen receptor positive status [ER+]: Secondary | ICD-10-CM | POA: Diagnosis not present

## 2021-07-28 DIAGNOSIS — G629 Polyneuropathy, unspecified: Secondary | ICD-10-CM | POA: Diagnosis not present

## 2021-07-28 DIAGNOSIS — Z79810 Long term (current) use of selective estrogen receptor modulators (SERMs): Secondary | ICD-10-CM | POA: Insufficient documentation

## 2021-07-28 DIAGNOSIS — C50212 Malignant neoplasm of upper-inner quadrant of left female breast: Secondary | ICD-10-CM

## 2021-07-28 LAB — COMPREHENSIVE METABOLIC PANEL
ALT: 6 U/L (ref 0–44)
AST: 25 U/L (ref 15–41)
Albumin: 3.9 g/dL (ref 3.5–5.0)
Alkaline Phosphatase: 29 U/L — ABNORMAL LOW (ref 38–126)
Anion gap: 7 (ref 5–15)
BUN: 15 mg/dL (ref 8–23)
CO2: 27 mmol/L (ref 22–32)
Calcium: 9.7 mg/dL (ref 8.9–10.3)
Chloride: 102 mmol/L (ref 98–111)
Creatinine, Ser: 0.79 mg/dL (ref 0.44–1.00)
GFR, Estimated: >60 mL/min (ref >60)
Glucose, Bld: 102 mg/dL — ABNORMAL HIGH (ref 70–99)
Potassium: 4.4 mmol/L (ref 3.5–5.1)
Sodium: 136 mmol/L (ref 135–145)
Total Bilirubin: 0.4 mg/dL (ref 0.3–1.2)
Total Protein: 7.8 g/dL (ref 6.5–8.1)

## 2021-07-28 LAB — CBC WITH DIFFERENTIAL/PLATELET
Abs Immature Granulocytes: 0.01 10*3/uL (ref 0.00–0.07)
Basophils Absolute: 0.1 10*3/uL (ref 0.0–0.1)
Basophils Relative: 1 %
Eosinophils Absolute: 0.1 10*3/uL (ref 0.0–0.5)
Eosinophils Relative: 2 %
HCT: 39 % (ref 36.0–46.0)
Hemoglobin: 13.3 g/dL (ref 12.0–15.0)
Immature Granulocytes: 0 %
Lymphocytes Relative: 21 %
Lymphs Abs: 1.4 10*3/uL (ref 0.7–4.0)
MCH: 30.6 pg (ref 26.0–34.0)
MCHC: 34.1 g/dL (ref 30.0–36.0)
MCV: 89.9 fL (ref 80.0–100.0)
Monocytes Absolute: 0.5 10*3/uL (ref 0.1–1.0)
Monocytes Relative: 8 %
Neutro Abs: 4.3 10*3/uL (ref 1.7–7.7)
Neutrophils Relative %: 68 %
Platelets: 277 10*3/uL (ref 150–400)
RBC: 4.34 MIL/uL (ref 3.87–5.11)
RDW: 13.1 % (ref 11.5–15.5)
WBC: 6.3 10*3/uL (ref 4.0–10.5)
nRBC: 0 % (ref 0.0–0.2)

## 2021-07-28 NOTE — Research (Signed)
P8099, A PROSPECTIVE OBSERVATIONAL COHORT STUDY TO DEVELOP A PREDICTIVE MODEL OF TAXANE-INDUCED PERIPHERAL NEUROPATHY IN CANCER PATIENTS. 52 Weeks ASSESSMENTS:    PROs; Questionnaires given to patient to complete in clinic. Collected questionnaires and checked for completeness and accuracy.  Labs; Patient did not consent to optional labs. Solicited Neuropathy Events Form; Patient reports ongoing numbness in her feet reporting they feel like they are asleep but not painful. She denies any numbness or tingling in her hands or fingertips. Patient is not taking any medication for the neuropathy symptoms. Form completed and signed by Dr. Lindi Adie. Physician Toxicity Burden Form; Completed by Dr. Lindi Adie.  Timed Get Up and Go Test; Completed per protocol.  History of Falls; Patient denies any falls in the past 6 months.  Concomitant Medications;  Reviewed medications with patient. She is taking a multivitamin called [employer] Hair, Skin [employer] Nails once daily. She reports taking for at least the past 5 years, maybe longer. Found ingredient list online and confirmed with patient it is the same supplement she is taking. The ingredients include Vitamin B12  8 mcg and Vitamin E 6.7 mg. Patient also taking a Vitamin B6 supplement 100 mg daily and anti-depressant (Sertraline). She is not taking any gabapentin, narcotics, tramadol, duloxetine, tylenol, ibuprofen, glutamine or fish oil. Topical Agents and other Treatments Patient is not using any topical agents or creams.  Patient has not used any complementary or alternative medicines since last study visit.  Neuropen Assessment; Completed per protocol by this certified research RN with assistance recording by research nurse, Doristine Johns, RN.    [place] Assessment; Completed per protocol by this certified research RN with assistance timing and recording by research nurse Doristine Johns.   Treatment:  Patient has completed treatment.  Plan: Thanked patient for  her participation in this study today. Next study visit due in one year and can be completed by phone or clinic. Research staff will be in contact for that visit.  Encouraged patient to call clinic if any questions or concerns prior to next contact. She verbalized understanding.   Foye Spurling, BSN, RN Clinical Research Nurse 07/28/2021 3:10 PM

## 2021-07-28 NOTE — Progress Notes (Signed)
Doris Ellis   [phone] Fax:(336) [phone]   Clinic Follow up Note   Patient Care Team: Asencion Noble, MD as PCP - General (Internal Medicine) Rockwell Germany, RN as Oncology Nurse Navigator Mauro Kaufmann, RN as Oncology Nurse Navigator Alphonsa Overall, MD as Consulting Physician (General Surgery) Kyung Rudd, MD as Consulting Physician (Radiation Oncology) Truitt Merle, MD as Consulting Physician (Hematology) Alla Feeling, NP as Nurse Practitioner (Nurse Practitioner)  Date of Service:  07/28/2021  CHIEF COMPLAINT: f/u of left beast cancer  CURRENT THERAPY:  Tamoxifen 32m daily starting in late 01/2021 Clinical Study S1714  ASSESSMENT & PLAN:  Doris HOLTSis a 80y.o. female with   1. Left breast cancer, grade 1-2, ER+/PR-/HER2- Ki-67 5%, pT1cN0M0, Grade 1, stage IA, RS 33,  -She was diagnosed in 06/2020 with grade 1-2 invasive ductal carcinoma of the left breast.  -She underwent left lumpectomy with SLNB with Dr NLucia Gaskinson 07/05/20. Her surgical pathology showed a 1.8 cm tumor with 1 negative lymph node.  Surgical margins were negative. Her Oncotype Dx testing showed high risk disease with recurrence score of 33. -She completed adjuvant Chemo with weekly Taxol 08/09/20-10/24/20 and adjuvant radiation 12/06/20-01/01/21.   -She began tamoxifen in 01/2021. -most recent mammogram from 05/21/21 was benign. -she is clinically doing well. Labs reviewed, overall WNL. Physical exam was unremarkable. There is no clinical concern for recurrence. -lab and f/u in 4 months   2. Osteopenia  -DEXA on 04/01/2017 shows osteopenia in the right femur neck with T score -1.6. Her FRAX score shows a 16.9% probability of a major osteoporotic fracture in 10 years and 3.1% risk for hip fracture -We encouraged her to begin calcium and vitamin D once daily and continue weightbearing exercise -01/16/21 DEXA shows osteopenia with -1.4 at left hip with higher risk of hip fracture.  -I  discussed the option of Zometa with her today and provided her with printed material. I discussed the small risk of jaw necrosis and advised her to check up with her dentist, and we will plan to start in 4 months if cleared.   3. Peripheral neuropathy, grade 1 -She reports mild numbness on his toes since completed chemotherapy, no impact on her walking or function.  No significant neuropathy of her fingers.  This is likely related to Taxol -I encouraged her exercise, and take vitamin B complex    PLAN: -continue tamoxifen -f/u with dentist in anticipation of beginning Zometa -lab and f/u in 4 months with Zometa infusion   No problem-specific Assessment & Plan notes found for this encounter.   SUMMARY OF ONCOLOGIC HISTORY: Oncology History Overview Note  Cancer Staging Malignant neoplasm of upper-inner quadrant of female breast ([place] Staging form: Breast, AJCC 8th Edition - Clinical stage from 06/11/2020: Stage IA (cT1b, cN0, cM0, G2, ER+, PR-, HER2: Equivocal) - Signed by BAlla Feeling NP on 07/01/2020 - Pathologic stage from 07/05/2020: Stage IA (pT1c, pN0, cM0, G2, ER+, PR-, HER2-, Oncotype DX score: 33) - Signed by FTruitt Merle MD on 07/29/2020    Malignant neoplasm of upper-inner quadrant of female breast ([place]  05/28/2020 Mammogram   Diagnostic left mammogram/US  -On physical exam, I palpate discrete focal soft thickening in the 10-11 o'clock location of the LEFT breast. -Targeted ultrasound is performed, showing an irregular taller than wide hypoechoic mass with indistinct margins in the 10 o'clock location of the LEFT breast 8 centimeters from the nipple. There is associated posterior acoustic shadowing. No significant  internal blood flow identified on Doppler evaluation.  IMPRESSION: Suspicious mass in the 10 o'clock location of the LEFT breast. No LEFT axillary adenopathy.     06/11/2020 Cancer Staging   Staging form: Breast, AJCC 8th Edition - Clinical stage from  06/11/2020: Stage IA (cT1b, cN0, cM0, G2, ER+, PR-, HER2: Equivocal) - Signed by Alla Feeling, NP on 07/01/2020   06/11/2020 Initial Biopsy   FINAL MICROSCOPIC DIAGNOSIS: A. BREAST, 10:00, LEFT, BIOPSY: - Invasive ductal carcinoma The carcinoma appears Nottingham grade 1-2 of 3 and measures 0.9 cm in greatest linear extent  PROGNOSTIC INDICATOR RESULTS: The tumor cells are EQUIVOCAL for Her2 (2+) Estrogen Receptor: 90%, MODERATE STAINING INTENSITY Progesterone Receptor: NEGATIVE Proliferation Marker Ki-67: 5%  FLOURESCENCE IN-SITU HYBRIDIZATION RESULTS: GROUP 5: HER2 **NEGATIVE** RATIO of HER2/CEN 17 SIGNALS: 1.18 AVERAGE HER2 COPY NUMBER PER CELL: 1.30   07/01/2020 Initial Diagnosis   Malignant neoplasm of upper-inner quadrant of female breast ([place])   07/05/2020 Surgery   LEFT BREAST LUMPECTOMY WITH RADIOACTIVE SEED AND LEFT AXILLARY SENTINEL LYMPH NODE BIOPSY by Dr Lucia Gaskins    07/05/2020 Pathology Results   FINAL MICROSCOPIC DIAGNOSIS:   A. BREAST, LEFT, LUMPECTOMY:  - Invasive ductal carcinoma, 1.8 cm.  - Margins not involved.  - Biopsy site and biopsy clip.  - See oncology table.   B. LYMPH NODE, LEFT AXILLARY, SENTINEL, EXCISION:  - One lymph node with no metastatic carcinoma (0/1).    07/05/2020 Oncotype testing   Oncotype  Recurrence Score 33 Distant recurrence risk at 9 years with Tamoxifen alone is 21% There is >15% benefit of adjuvant chemotherapy.    07/05/2020 Cancer Staging   Staging form: Breast, AJCC 8th Edition - Pathologic stage from 07/05/2020: Stage IA (pT1c, pN0, cM0, G2, ER+, PR-, HER2-, Oncotype DX score: 33) - Signed by Truitt Merle, MD on 07/29/2020   08/09/2020 - 10/24/2020 Chemotherapy   Weekly Taxol for 12 weeks starting 08/09/20-10/24/20   12/06/2020 - 01/01/2021 Radiation Therapy   Adjuvant Radiation with Dr Lisbeth Renshaw    01/2021 -  Anti-estrogen oral therapy   Tamoxifen 40m daily starting in late 01/2021   04/28/2021 Survivorship   SCP delivered by LCira Rue NP       INTERVAL HISTORY:  Doris Ellis here for a follow up of breast cancer. She was last seen by me on 12/27/20, with phone visit on 01/24/21 in the interim. She presents to the clinic accompanied by our study nurse. She reports pain in her hands from arthritis.   All other systems were reviewed with the patient and are negative.  MEDICAL HISTORY:  Past Medical History:  Diagnosis Date   Anxiety    Arthritis    Cancer ([place] 05/2020   left breast IDC   Concussion 1985   Depression    Hyperlipidemia    Hypertension    Mild concussion 2002   tripped over bag at work   Osteopenia 07/2011   T score -1.8 FRAX 11%/1.5%   Panic attacks    Personal history of chemotherapy    Personal history of radiation therapy    Seasonal allergies     SURGICAL HISTORY: Past Surgical History:  Procedure Laterality Date   ABDOMINAL HYSTERECTOMY  april 1990   TAH BSO   BACK SURGERY  1996   ruputured disc L-5   BLADDER SUSPENSION  1982   BREAST BIOPSY Left    BREAST LUMPECTOMY WITH RADIOACTIVE SEED AND SENTINEL LYMPH NODE BIOPSY Left 07/05/2020   Procedure: LEFT BREAST  LUMPECTOMY WITH RADIOACTIVE SEED AND LEFT AXILLARY SENTINEL LYMPH NODE BIOPSY;  Surgeon: Alphonsa Overall, MD;  Location: [place];  Service: General;  Laterality: Left;  LEFT BREAST CANCER   BREAST SURGERY  1984   cyst-benign   COLONOSCOPY N/A 03/30/2013   Procedure: COLONOSCOPY;  Surgeon: Rogene Houston, MD;  [facility];  Service: Endoscopy;  Laterality: N/A;  [place] OF UTERUS  12/09/1988   abnormal uterine bleeding   IR IMAGING GUIDED PORT INSERTION  08/05/2020   PORT-A-CATH REMOVAL Right 04/17/2021   Procedure: REMOVAL PORT-A-CATH;  Surgeon: Erroll Luna, MD;  Location: [place];  Service: General;  Laterality: Right;    I have reviewed the social history and family history with the patient and they are unchanged from previous  note.  ALLERGIES:  is allergic to lipitor [atorvastatin calcium], zocor [simvastatin - high dose], and benadryl [diphenhydramine].  MEDICATIONS:  Current Outpatient Medications  Medication Sig Dispense Refill   ALPRAZolam (XANAX) 0.5 MG tablet Take 0.5 mg by mouth 2 (two) times daily.     amLODipine (NORVASC) 2.5 MG tablet Take 2.5 mg by mouth daily.     cyclobenzaprine (FLEXERIL) 10 MG tablet TAKE 1 TABLET(10 MG) BY MOUTH THREE TIMES DAILY AS NEEDED FOR MUSCLE SPASMS 30 tablet 0   fluticasone (FLONASE) 50 MCG/ACT nasal spray Place into both nostrils daily.     multivitamin (THERAGRAN) per tablet Take 1 tablet by mouth daily.     naproxen sodium (ALEVE) 220 MG tablet Take 220 mg by mouth.     pravastatin (PRAVACHOL) 10 MG tablet Take 10 mg by mouth daily.     Pyridoxine HCl (VITAMIN B-6 PO) Take 1 tablet by mouth daily.      sertraline (ZOLOFT) 50 MG tablet Take 50 mg by mouth daily.     tamoxifen (NOLVADEX) 20 MG tablet TAKE 1 TABLET(20 MG) BY MOUTH DAILY 30 tablet 3   No current facility-administered medications for this visit.    PHYSICAL EXAMINATION: ECOG PERFORMANCE STATUS: 0 - Asymptomatic  Vitals:   07/28/21 1415  BP: 136/61  Pulse: 67  Resp: 18  Temp: 98.1 F (36.7 C)  SpO2: 97%   Wt Readings from Last 3 Encounters:  07/28/21 137 lb 14.4 oz (62.6 kg)  04/28/21 133 lb (60.3 kg)  04/17/21 134 lb 7.7 oz (61 kg)     GENERAL:alert, no distress and comfortable SKIN: skin color, texture, turgor are normal, no rashes or significant lesions EYES: normal, Conjunctiva are pink and non-injected, sclera clear  NECK: supple, thyroid normal size, non-tender, without nodularity LYMPH:  no palpable lymphadenopathy in the cervical, axillary  LUNGS: clear to auscultation and percussion with normal breathing effort HEART: regular rate & rhythm and no murmurs and no lower extremity edema ABDOMEN:abdomen soft, non-tender and normal bowel sounds Musculoskeletal:no cyanosis of digits  and no clubbing  NEURO: alert & oriented x 3 with fluent speech, no focal motor/sensory deficits BREAST: No palpable mass, nodules or adenopathy bilaterally. Breast exam benign.   LABORATORY DATA:  I have reviewed the data as listed CBC Latest Ref Rng & Units 07/28/2021 04/14/2021 10/24/2020  WBC 4.0 - 10.5 K/uL 6.3 6.9 4.6  Hemoglobin 12.0 - 15.0 g/dL 13.3 13.2 11.0(L)  Hematocrit 36.0 - 46.0 % 39.0 39.6 32.9(L)  Platelets 150 - 400 K/uL 277 187 399     CMP Latest Ref Rng & Units 07/28/2021 04/14/2021 10/24/2020  Glucose 70 - 99 mg/dL 102(H) 109(H) 113(H)  BUN 8 - 23 mg/dL '15 14 13  ' Creatinine 0.44 - 1.00 mg/dL 0.79 0.79 0.71  Sodium 135 - 145 mmol/L 136 132(L) 135  Potassium 3.5 - 5.1 mmol/L 4.4 4.6 4.3  Chloride 98 - 111 mmol/L 102 98 104  CO2 22 - 32 mmol/L '27 25 24  ' Calcium 8.9 - 10.3 mg/dL 9.7 9.9 9.4  Total Protein 6.5 - 8.1 g/dL 7.8 7.5 7.3  Total Bilirubin 0.3 - 1.2 mg/dL 0.4 0.7 0.4  Alkaline Phos 38 - 126 U/L 29(L) 30(L) 43  AST 15 - 41 U/L '25 27 21  ' ALT 0 - 44 U/L '6 10 10      ' RADIOGRAPHIC STUDIES: I have personally reviewed the radiological images as listed and agreed with the findings in the report. No results found.    No orders of the defined types were placed in this encounter.  All questions were answered. The patient knows to call the clinic with any problems, questions or concerns. No barriers to learning was detected. The total time spent in the appointment was 30 minutes.     Truitt Merle, MD 07/28/2021   I, Wilburn Mylar, am acting as scribe for Truitt Merle, MD.   I have reviewed the above documentation for accuracy and completeness, and I agree with the above.

## 2021-07-29 ENCOUNTER — Encounter: Payer: Self-pay | Admitting: Hematology

## 2021-09-11 DIAGNOSIS — H524 Presbyopia: Secondary | ICD-10-CM | POA: Diagnosis not present

## 2021-09-11 DIAGNOSIS — Z961 Presence of intraocular lens: Secondary | ICD-10-CM | POA: Diagnosis not present

## 2021-09-28 ENCOUNTER — Other Ambulatory Visit: Payer: Self-pay | Admitting: Hematology

## 2021-10-05 DIAGNOSIS — R52 Pain, unspecified: Secondary | ICD-10-CM | POA: Diagnosis not present

## 2021-11-07 ENCOUNTER — Telehealth: Payer: Self-pay | Admitting: *Deleted

## 2021-11-07 NOTE — Telephone Encounter (Signed)
Spoke to pt, confirmed and verified appt for 1/23. No further needs voiced at this time.

## 2021-11-13 DIAGNOSIS — I10 Essential (primary) hypertension: Secondary | ICD-10-CM | POA: Diagnosis not present

## 2021-11-21 ENCOUNTER — Other Ambulatory Visit: Payer: Self-pay

## 2021-11-21 DIAGNOSIS — Z170 Estrogen receptor positive status [ER+]: Secondary | ICD-10-CM

## 2021-11-21 DIAGNOSIS — C50212 Malignant neoplasm of upper-inner quadrant of left female breast: Secondary | ICD-10-CM

## 2021-11-24 ENCOUNTER — Inpatient Hospital Stay: Payer: Medicare Other | Attending: Hematology

## 2021-11-24 ENCOUNTER — Other Ambulatory Visit: Payer: Self-pay

## 2021-11-24 ENCOUNTER — Inpatient Hospital Stay: Payer: Medicare Other

## 2021-11-24 ENCOUNTER — Inpatient Hospital Stay: Payer: Medicare Other | Admitting: Hematology

## 2021-11-24 VITALS — BP 153/74 | HR 73 | Temp 98.3°F | Resp 17 | Wt 135.9 lb

## 2021-11-24 DIAGNOSIS — I10 Essential (primary) hypertension: Secondary | ICD-10-CM | POA: Diagnosis not present

## 2021-11-24 DIAGNOSIS — Z923 Personal history of irradiation: Secondary | ICD-10-CM | POA: Diagnosis not present

## 2021-11-24 DIAGNOSIS — Z9221 Personal history of antineoplastic chemotherapy: Secondary | ICD-10-CM | POA: Diagnosis not present

## 2021-11-24 DIAGNOSIS — C50212 Malignant neoplasm of upper-inner quadrant of left female breast: Secondary | ICD-10-CM

## 2021-11-24 DIAGNOSIS — Z95828 Presence of other vascular implants and grafts: Secondary | ICD-10-CM

## 2021-11-24 DIAGNOSIS — M85852 Other specified disorders of bone density and structure, left thigh: Secondary | ICD-10-CM | POA: Insufficient documentation

## 2021-11-24 DIAGNOSIS — Z170 Estrogen receptor positive status [ER+]: Secondary | ICD-10-CM | POA: Insufficient documentation

## 2021-11-24 DIAGNOSIS — Z79810 Long term (current) use of selective estrogen receptor modulators (SERMs): Secondary | ICD-10-CM | POA: Diagnosis not present

## 2021-11-24 DIAGNOSIS — Z79899 Other long term (current) drug therapy: Secondary | ICD-10-CM | POA: Insufficient documentation

## 2021-11-24 LAB — CMP (CANCER CENTER ONLY)
ALT: 13 U/L (ref 0–44)
AST: 28 U/L (ref 15–41)
Albumin: 4 g/dL (ref 3.5–5.0)
Alkaline Phosphatase: 29 U/L — ABNORMAL LOW (ref 38–126)
Anion gap: 10 (ref 5–15)
BUN: 11 mg/dL (ref 8–23)
CO2: 20 mmol/L — ABNORMAL LOW (ref 22–32)
Calcium: 9.2 mg/dL (ref 8.9–10.3)
Chloride: 103 mmol/L (ref 98–111)
Creatinine: 0.68 mg/dL (ref 0.44–1.00)
GFR, Estimated: >60 mL/min (ref >60)
Glucose, Bld: 129 mg/dL — ABNORMAL HIGH (ref 70–99)
Potassium: 3.9 mmol/L (ref 3.5–5.1)
Sodium: 133 mmol/L — ABNORMAL LOW (ref 135–145)
Total Bilirubin: 0.7 mg/dL (ref 0.3–1.2)
Total Protein: 8.2 g/dL — ABNORMAL HIGH (ref 6.5–8.1)

## 2021-11-24 LAB — CBC WITH DIFFERENTIAL/PLATELET
Abs Immature Granulocytes: 0.02 10*3/uL (ref 0.00–0.07)
Basophils Absolute: 0.1 10*3/uL (ref 0.0–0.1)
Basophils Relative: 1 %
Eosinophils Absolute: 0.1 10*3/uL (ref 0.0–0.5)
Eosinophils Relative: 2 %
HCT: 37.5 % (ref 36.0–46.0)
Hemoglobin: 13.1 g/dL (ref 12.0–15.0)
Immature Granulocytes: 0 %
Lymphocytes Relative: 20 %
Lymphs Abs: 1.4 10*3/uL (ref 0.7–4.0)
MCH: 30.7 pg (ref 26.0–34.0)
MCHC: 34.9 g/dL (ref 30.0–36.0)
MCV: 87.8 fL (ref 80.0–100.0)
Monocytes Absolute: 0.4 10*3/uL (ref 0.1–1.0)
Monocytes Relative: 6 %
Neutro Abs: 4.9 10*3/uL (ref 1.7–7.7)
Neutrophils Relative %: 71 %
Platelets: 287 10*3/uL (ref 150–400)
RBC: 4.27 MIL/uL (ref 3.87–5.11)
RDW: 12.9 % (ref 11.5–15.5)
WBC: 6.9 10*3/uL (ref 4.0–10.5)
nRBC: 0 % (ref 0.0–0.2)

## 2021-11-24 MED ORDER — SODIUM CHLORIDE 0.9 % IV SOLN: Freq: Once | INTRAVENOUS | Status: AC

## 2021-11-24 MED ORDER — ZOLEDRONIC ACID 4 MG/100ML IV SOLN
4.0000 mg | Freq: Once | INTRAVENOUS | Status: AC
  Administered 2021-11-24: 4 mg via INTRAVENOUS
  Filled 2021-11-24: qty 100

## 2021-11-24 NOTE — Progress Notes (Signed)
[place]   Telephone:(336) [phone] Fax:(336) [phone]   Clinic Follow up Note   Patient Care Team: Asencion Noble, MD as PCP - General (Internal Medicine) Rockwell Germany, RN as Oncology Nurse Navigator Mauro Kaufmann, RN as Oncology Nurse Navigator Alphonsa Overall, MD as Consulting Physician (General Surgery) Kyung Rudd, MD as Consulting Physician (Radiation Oncology) Truitt Merle, MD as Consulting Physician (Hematology) Alla Feeling, NP as Nurse Practitioner (Nurse Practitioner)  Date of Service:  11/24/2021  CHIEF COMPLAINT: f/u of left breast cancer  CURRENT THERAPY:  Tamoxifen 38m daily starting in late 01/2021 Clinical Study S1714  ASSESSMENT & PLAN:  Doris LEBERis a 81y.o. female with   1. Left breast cancer, grade 1-2, ER+/PR-/HER2- Ki-67 5%, pT1cN0M0, Grade 1, stage IA, RS 33,  -She was diagnosed in 06/2020. S/p left lumpectomy with SLNB with Dr NLucia Gaskinson 07/05/20, showing: 1.8 cm IDC; 1 negative lymph node; negative margins.  -Oncotype Dx RS of 33, showing high risk. She completed adjuvant Chemo with weekly Taxol 10/8-12/23/21 and adjuvant radiation 12/06/20-01/01/21.   -She began tamoxifen in 01/2021. She is tolerating well. -most recent mammogram from 05/21/21 was benign. -she is clinically doing well. Labs reviewed, overall WNL. Physical exam was unremarkable. There is no clinical concern for recurrence. -lab and f/u in 6 months   2. Osteopenia  -most recent DEXA on 01/16/21 shows osteopenia with -1.4 at left hip with higher risk of hip fracture. -she is taking calcium and vit D -she is scheduled to begin Zometa today.     PLAN: -proceed with Zometa today -continue tamoxifen -lab and f/u in 6 months with next Zometa infusion   No problem-specific Assessment & Plan notes found for this encounter.   SUMMARY OF ONCOLOGIC HISTORY: Oncology History Overview Note  Cancer Staging Malignant neoplasm of upper-inner quadrant of female breast  ([place] Staging form: Breast, AJCC 8th Edition - Clinical stage from 06/11/2020: Stage IA (cT1b, cN0, cM0, G2, ER+, PR-, HER2: Equivocal) - Signed by BAlla Feeling NP on 07/01/2020 - Pathologic stage from 07/05/2020: Stage IA (pT1c, pN0, cM0, G2, ER+, PR-, HER2-, Oncotype DX score: 33) - Signed by FTruitt Merle MD on 07/29/2020    Malignant neoplasm of upper-inner quadrant of female breast ([place]  05/28/2020 Mammogram   Diagnostic left mammogram/US  -On physical exam, I palpate discrete focal soft thickening in the 10-11 o'clock location of the LEFT breast. -Targeted ultrasound is performed, showing an irregular taller than wide hypoechoic mass with indistinct margins in the 10 o'clock location of the LEFT breast 8 centimeters from the nipple. There is associated posterior acoustic shadowing. No significant internal blood flow identified on Doppler evaluation.  IMPRESSION: Suspicious mass in the 10 o'clock location of the LEFT breast. No LEFT axillary adenopathy.     06/11/2020 Cancer Staging   Staging form: Breast, AJCC 8th Edition - Clinical stage from 06/11/2020: Stage IA (cT1b, cN0, cM0, G2, ER+, PR-, HER2: Equivocal) - Signed by BAlla Feeling NP on 07/01/2020    06/11/2020 Initial Biopsy   FINAL MICROSCOPIC DIAGNOSIS: A. BREAST, 10:00, LEFT, BIOPSY: - Invasive ductal carcinoma The carcinoma appears Nottingham grade 1-2 of 3 and measures 0.9 cm in greatest linear extent  PROGNOSTIC INDICATOR RESULTS: The tumor cells are EQUIVOCAL for Her2 (2+) Estrogen Receptor: 90%, MODERATE STAINING INTENSITY Progesterone Receptor: NEGATIVE Proliferation Marker Ki-67: 5%  FLOURESCENCE IN-SITU HYBRIDIZATION RESULTS: GROUP 5: HER2 **NEGATIVE** RATIO of HER2/CEN 17 SIGNALS: 1.18 AVERAGE HER2 COPY NUMBER PER CELL: 1.30  07/01/2020 Initial Diagnosis   Malignant neoplasm of upper-inner quadrant of female breast ([place])   07/05/2020 Surgery   LEFT BREAST LUMPECTOMY WITH RADIOACTIVE SEED AND LEFT  AXILLARY SENTINEL LYMPH NODE BIOPSY by Dr Lucia Gaskins    07/05/2020 Pathology Results   FINAL MICROSCOPIC DIAGNOSIS:   A. BREAST, LEFT, LUMPECTOMY:  - Invasive ductal carcinoma, 1.8 cm.  - Margins not involved.  - Biopsy site and biopsy clip.  - See oncology table.   B. LYMPH NODE, LEFT AXILLARY, SENTINEL, EXCISION:  - One lymph node with no metastatic carcinoma (0/1).    07/05/2020 Oncotype testing   Oncotype  Recurrence Score 33 Distant recurrence risk at 9 years with Tamoxifen alone is 21% There is >15% benefit of adjuvant chemotherapy.    07/05/2020 Cancer Staging   Staging form: Breast, AJCC 8th Edition - Pathologic stage from 07/05/2020: Stage IA (pT1c, pN0, cM0, G2, ER+, PR-, HER2-, Oncotype DX score: 33) - Signed by Truitt Merle, MD on 07/29/2020    08/09/2020 - 10/24/2020 Chemotherapy   Weekly Taxol for 12 weeks starting 08/09/20-10/24/20   12/06/2020 - 01/01/2021 Radiation Therapy   Adjuvant Radiation with Dr Lisbeth Renshaw    01/2021 -  Anti-estrogen oral therapy   Tamoxifen 18m daily starting in late 01/2021   04/28/2021 Survivorship   SCP delivered by LCira Rue NP       INTERVAL HISTORY:  Doris Ellis here for a follow up of breast cancer. She was last seen by me on 07/28/21. She presents to the clinic alone. She reports she is doing well overall. She denies any noticeable side effects from tamoxifen, including vaginal bleeding or discharge.  She does note pain and stiffness in her right hand, which is her dominant hand. She denies issues with her left hand. She has visible arthritis in her fingers.   All other systems were reviewed with the patient and are negative.  MEDICAL HISTORY:  Past Medical History:  Diagnosis Date   Anxiety    Arthritis    [place] 05/[place]   left breast IDC   Concussion 1985   Depression    Hyperlipidemia    Hypertension    Mild concussion 2002   tripped over bag at work   Osteopenia 07/2011   T score -1.8 FRAX 11%/1.5%   Panic attacks     Personal history of chemotherapy    Personal history of radiation therapy    Seasonal allergies     SURGICAL HISTORY: Past Surgical History:  Procedure Laterality Date   ABDOMINAL HYSTERECTOMY  april 1990   TAH BSO   BACK SURGERY  1996   ruputured disc L-5   BLADDER SUSPENSION  1982   BREAST BIOPSY Left    BREAST LUMPECTOMY WITH RADIOACTIVE SEED AND SENTINEL LYMPH NODE BIOPSY Left 07/05/2020   Procedure: LEFT BREAST LUMPECTOMY WITH RADIOACTIVE SEED AND LEFT AXILLARY SENTINEL LYMPH NODE BIOPSY;  Surgeon: NAlphonsa Overall MD;  [place]  Service: General;  Laterality: Left;  LEFT BREAST CANCER   BREAST SURGERY  1984   cyst-benign   COLONOSCOPY N/A 03/30/2013   Procedure: COLONOSCOPY;  Surgeon: NRogene Houston MD;  [facility];  Service: Endoscopy;  Laterality: N/A;  [place]  12/09/1988   abnormal uterine bleeding   IR IMAGING GUIDED PORT INSERTION  08/05/2020   PORT-A-CATH REMOVAL Right 04/17/2021   Procedure: REMOVAL PORT-A-CATH;  Surgeon: CErroll Luna MD;  [place]  Service:  General;  Laterality: Right;    I have reviewed the social history and family history with the patient and they are unchanged from previous note.  ALLERGIES:  is allergic to lipitor [atorvastatin calcium], zocor [simvastatin - high dose], and benadryl [diphenhydramine].  MEDICATIONS:  Current Outpatient Medications  Medication Sig Dispense Refill   ALPRAZolam (XANAX) 0.5 MG tablet Take 0.5 mg by mouth 2 (two) times daily.     amLODipine (NORVASC) 2.5 MG tablet Take 2.5 mg by mouth daily.     cyclobenzaprine (FLEXERIL) 10 MG tablet TAKE 1 TABLET(10 MG) BY MOUTH THREE TIMES DAILY AS NEEDED FOR MUSCLE SPASMS 30 tablet 0   fluticasone (FLONASE) 50 MCG/ACT nasal spray Place into both nostrils daily.     Multiple Vitamins-Minerals (HAIR SKIN AND NAILS FORMULA PO) Take 1 capsule by mouth daily. [employer] Brand Hair  Skin and Nails Supplement     naproxen sodium (ALEVE) 220 MG tablet Take 220 mg by mouth.     pravastatin (PRAVACHOL) 10 MG tablet Take 10 mg by mouth daily.     Pyridoxine HCl (VITAMIN B-6 PO) Take 100 mg by mouth daily.     sertraline (ZOLOFT) 50 MG tablet Take 50 mg by mouth daily.     tamoxifen (NOLVADEX) 20 MG tablet TAKE 1 TABLET(20 MG) BY MOUTH DAILY 30 tablet 3   No current facility-administered medications for this visit.    PHYSICAL EXAMINATION: ECOG PERFORMANCE STATUS: 0 - Asymptomatic  Vitals:   11/24/21 1106  BP: (!) 153/74  Pulse: 73  Resp: 17  Temp: 98.3 F (36.8 C)  SpO2: 99%   Wt Readings from Last 3 Encounters:  11/24/21 135 lb 14.4 oz (61.6 kg)  07/28/21 137 lb 14.4 oz (62.6 kg)  04/28/21 133 lb (60.3 kg)     GENERAL:alert, no distress and comfortable SKIN: skin color, texture, turgor are normal, no rashes or significant lesions EYES: normal, Conjunctiva are pink and non-injected, sclera clear  NECK: supple, thyroid normal size, non-tender, without nodularity LYMPH:  no palpable lymphadenopathy in the cervical, axillary  LUNGS: clear to auscultation and percussion with normal breathing effort HEART: regular rate & rhythm and no murmurs and no lower extremity edema ABDOMEN:abdomen soft, non-tender and normal bowel sounds Musculoskeletal:no cyanosis of digits and no clubbing  NEURO: alert & oriented x 3 with fluent speech, no focal motor/sensory deficits BREAST: No palpable mass, nodules or adenopathy bilaterally. Breast exam benign.   LABORATORY DATA:  I have reviewed the data as listed CBC Latest Ref Rng & Units 11/24/2021 07/28/2021 04/14/2021  WBC 4.0 - 10.5 K/uL 6.9 6.3 6.9  Hemoglobin 12.0 - 15.0 g/dL 13.1 13.3 13.2  Hematocrit 36.0 - 46.0 % 37.5 39.0 39.6  Platelets 150 - 400 K/uL 287 277 187     CMP Latest Ref Rng & Units 11/24/2021 07/28/2021 04/14/2021  Glucose 70 - 99 mg/dL 129(H) 102(H) 109(H)  BUN 8 - 23 mg/dL _0 Creatinine 0.44 -  1.00 mg/dL 0.68 0.79 0.79  Sodium 135 - 145 mmol/L 133(L) 136 132(L)  Potassium 3.5 - 5.1 mmol/L 3.9 4.4 4.6  Chloride 98 - 111 mmol/L 103 102 98  CO2 22 - 32 mmol/L 20(L) 27 25  Calcium 8.9 - 10.3 mg/dL 9.2 9.7 9.9  Total Protein 6.5 - 8.1 g/dL 8.2(H) 7.8 7.5  Total Bilirubin 0.3 - 1.2 mg/dL 0.7 0.4 0.7  Alkaline Phos 38 - 126 U/L 29(L) 29(L) 30(L)  AST 15 - 41 U/L _1 ALT  0 - 44 U/L _0 RADIOGRAPHIC STUDIES: I have personally reviewed the radiological images as listed and agreed with the findings in the report. No results found.    Orders Placed This Encounter  Procedures   CMP ([place] only)   All questions were answered. The patient knows to call the clinic with any problems, questions or concerns. No barriers to learning was detected. The total time spent in the appointment was 30 minutes.     Truitt Merle, MD 11/24/2021   I, Wilburn Mylar, am acting as scribe for Truitt Merle, MD.   I have reviewed the above documentation for accuracy and completeness, and I agree with the above.

## 2021-11-24 NOTE — Patient Instructions (Signed)

## 2021-11-25 ENCOUNTER — Encounter: Payer: Self-pay | Admitting: Hematology

## 2021-11-27 ENCOUNTER — Encounter (HOSPITAL_COMMUNITY): Payer: Self-pay

## 2021-12-04 ENCOUNTER — Telehealth: Payer: Self-pay | Admitting: Hematology

## 2021-12-04 ENCOUNTER — Telehealth: Payer: Self-pay | Admitting: *Deleted

## 2021-12-04 NOTE — Telephone Encounter (Signed)
Pt called with c/o bil hand pain and inability to "open jars" Per Dr. Burr Medico, pt to stop tamoxifen and will be seen with labs next week. Informed pt not to take further Tamoxifen. Received verbal understanding. No further needs or concerns voiced

## 2021-12-04 NOTE — Telephone Encounter (Signed)
Sch per 2/2 inbasket, pt aware  °

## 2021-12-09 ENCOUNTER — Inpatient Hospital Stay: Payer: Medicare Other | Admitting: Hematology

## 2021-12-09 ENCOUNTER — Inpatient Hospital Stay: Payer: Medicare Other | Attending: Hematology

## 2021-12-09 ENCOUNTER — Encounter: Payer: Self-pay | Admitting: Hematology

## 2021-12-09 ENCOUNTER — Other Ambulatory Visit: Payer: Self-pay

## 2021-12-09 VITALS — BP 136/74 | HR 83 | Temp 98.1°F | Resp 18 | Ht 64.0 in | Wt 136.0 lb

## 2021-12-09 DIAGNOSIS — I10 Essential (primary) hypertension: Secondary | ICD-10-CM | POA: Insufficient documentation

## 2021-12-09 DIAGNOSIS — Z923 Personal history of irradiation: Secondary | ICD-10-CM | POA: Diagnosis not present

## 2021-12-09 DIAGNOSIS — R531 Weakness: Secondary | ICD-10-CM | POA: Insufficient documentation

## 2021-12-09 DIAGNOSIS — C50212 Malignant neoplasm of upper-inner quadrant of left female breast: Secondary | ICD-10-CM | POA: Insufficient documentation

## 2021-12-09 DIAGNOSIS — Z79899 Other long term (current) drug therapy: Secondary | ICD-10-CM | POA: Insufficient documentation

## 2021-12-09 DIAGNOSIS — M79642 Pain in left hand: Secondary | ICD-10-CM | POA: Insufficient documentation

## 2021-12-09 DIAGNOSIS — Z170 Estrogen receptor positive status [ER+]: Secondary | ICD-10-CM

## 2021-12-09 DIAGNOSIS — M85852 Other specified disorders of bone density and structure, left thigh: Secondary | ICD-10-CM | POA: Insufficient documentation

## 2021-12-09 LAB — CBC WITH DIFFERENTIAL/PLATELET
Abs Immature Granulocytes: 0.01 10*3/uL (ref 0.00–0.07)
Basophils Absolute: 0.1 10*3/uL (ref 0.0–0.1)
Basophils Relative: 1 %
Eosinophils Absolute: 0.2 10*3/uL (ref 0.0–0.5)
Eosinophils Relative: 3 %
HCT: 40.4 % (ref 36.0–46.0)
Hemoglobin: 13.5 g/dL (ref 12.0–15.0)
Immature Granulocytes: 0 %
Lymphocytes Relative: 26 %
Lymphs Abs: 1.6 10*3/uL (ref 0.7–4.0)
MCH: 30.1 pg (ref 26.0–34.0)
MCHC: 33.4 g/dL (ref 30.0–36.0)
MCV: 90 fL (ref 80.0–100.0)
Monocytes Absolute: 0.5 10*3/uL (ref 0.1–1.0)
Monocytes Relative: 8 %
Neutro Abs: 3.8 10*3/uL (ref 1.7–7.7)
Neutrophils Relative %: 62 %
Platelets: 322 10*3/uL (ref 150–400)
RBC: 4.49 MIL/uL (ref 3.87–5.11)
RDW: 13 % (ref 11.5–15.5)
WBC: 6.2 10*3/uL (ref 4.0–10.5)
nRBC: 0 % (ref 0.0–0.2)

## 2021-12-09 LAB — COMPREHENSIVE METABOLIC PANEL
ALT: 8 U/L (ref 0–44)
AST: 22 U/L (ref 15–41)
Albumin: 4.2 g/dL (ref 3.5–5.0)
Alkaline Phosphatase: 29 U/L — ABNORMAL LOW (ref 38–126)
Anion gap: 9 (ref 5–15)
BUN: 13 mg/dL (ref 8–23)
CO2: 26 mmol/L (ref 22–32)
Calcium: 9.9 mg/dL (ref 8.9–10.3)
Chloride: 100 mmol/L (ref 98–111)
Creatinine, Ser: 0.79 mg/dL (ref 0.44–1.00)
GFR, Estimated: >60 mL/min (ref >60)
Glucose, Bld: 114 mg/dL — ABNORMAL HIGH (ref 70–99)
Potassium: 4.1 mmol/L (ref 3.5–5.1)
Sodium: 135 mmol/L (ref 135–145)
Total Bilirubin: 0.4 mg/dL (ref 0.3–1.2)
Total Protein: 8 g/dL (ref 6.5–8.1)

## 2021-12-09 NOTE — Progress Notes (Signed)
[place]   [phone] Fax:(336) [phone]   Clinic Follow up Note   Patient Care Team: Asencion Noble, MD as PCP - General (Internal Medicine) Rockwell Germany, RN as Oncology Nurse Navigator Mauro Kaufmann, RN as Oncology Nurse Navigator Alphonsa Overall, MD as Consulting Physician (General Surgery) Kyung Rudd, MD as Consulting Physician (Radiation Oncology) Truitt Merle, MD as Consulting Physician (Hematology) Alla Feeling, NP as Nurse Practitioner (Nurse Practitioner)  Date of Service:  12/09/2021  CHIEF COMPLAINT: Bilateral hand pain and weakness  CURRENT THERAPY:  Tamoxifen 69m daily starting in late 01/2021 Zometa every 6 months started on 11/24/2021 Clinical Study [id number]  ASSESSMENT & PLAN:  Doris Ellis a 81y.o. female with    B/l hand pain and weakness  -She started to experience bilateral hand weakness and pain, 3 days after her first Zometa infusion.  No joint edema, skin change, or neuropathy. Exam was unremarkable -This is possible related to Zometa infusion, plan to stop it -Her symptom has gradually improved, she will continue to wear the wrist brace, okay to take Tylenol as needed   2. Left breast cancer, grade 1-2, ER+/PR-/HER2- Ki-67 5%, pT1cN0M0, Grade 1, stage IA, RS 33,  -She was diagnosed in 06/2020. S/p left lumpectomy with SLNB with Dr NLucia Gaskinson 07/05/20, showing: 1.8 cm IDC; 1 negative lymph node; negative margins.  -Oncotype Dx RS of 33, showing high risk. She completed adjuvant Chemo with weekly Taxol 10/8-12/23/21 and adjuvant radiation 12/06/20-01/01/21.   -She began tamoxifen in 01/2021. She is tolerating well. -most recent mammogram from 05/21/21 was benign. -Due to her new onset of bilateral hand pain, I told her to stop tamoxifen on 12/04/2021 -if her hand symptoms resolves in a few month, OK to restart    2. Osteopenia  -most recent DEXA on 01/16/21 shows osteopenia with -1.4 at left hip with higher risk of hip fracture. -she  is taking calcium and vit D -She received 1 dose of Zometa, but developed bilateral hand pain and weakness, I will stop. -I discussed that tamoxifen is likely going to stress her bone.     PLAN: -Continue holding tamoxifen, if her hand symptoms completely resolve in a few months, okay to restart, [place] -I will cancel her next Zometa infusion -I will see her back as scheduled in July   No problem-specific Assessment & Plan notes found for this encounter.   SUMMARY OF ONCOLOGIC HISTORY: Oncology History Overview Note  Cancer Staging Malignant neoplasm of upper-inner quadrant of female breast ([facility] Staging form: Breast, AJCC 8th Edition - Clinical stage from 06/11/2020: Stage IA (cT1b, cN0, cM0, G2, ER+, PR-, HER2: Equivocal) - Signed by BAlla Feeling NP on 07/01/2020 - Pathologic stage from 07/05/2020: Stage IA (pT1c, pN0, cM0, G2, ER+, PR-, HER2-, Oncotype DX score: 33) - Signed by FTruitt Merle MD on 07/29/2020    Malignant neoplasm of upper-inner quadrant of female breast ([facility]  05/28/2020 Mammogram   Diagnostic left mammogram/US  -On physical exam, I palpate discrete focal soft thickening in the 10-11 o'clock location of the LEFT breast. -Targeted ultrasound is performed, showing an irregular taller than wide hypoechoic mass with indistinct margins in the 10 o'clock location of the LEFT breast 8 centimeters from the nipple. There is associated posterior acoustic shadowing. No significant internal blood flow identified on Doppler evaluation.  IMPRESSION: Suspicious mass in the 10 o'clock location of the LEFT breast. No LEFT axillary adenopathy.     06/11/2020  Cancer Staging   Staging form: Breast, AJCC 8th Edition - Clinical stage from 06/11/2020: Stage IA (cT1b, cN0, cM0, G2, ER+, PR-, HER2: Equivocal) - Signed by Alla Feeling, NP on 07/01/2020    06/11/2020 Initial Biopsy   FINAL MICROSCOPIC DIAGNOSIS: A. BREAST, 10:00, LEFT, BIOPSY: - Invasive ductal  carcinoma The carcinoma appears Nottingham grade 1-2 of 3 and measures 0.9 cm in greatest linear extent  PROGNOSTIC INDICATOR RESULTS: The tumor cells are EQUIVOCAL for Her2 (2+) Estrogen Receptor: 90%, MODERATE STAINING INTENSITY Progesterone Receptor: NEGATIVE Proliferation Marker Ki-67: 5%  FLOURESCENCE IN-SITU HYBRIDIZATION RESULTS: GROUP 5: HER2 **NEGATIVE** RATIO of HER2/CEN 17 SIGNALS: 1.18 AVERAGE HER2 COPY NUMBER PER CELL: 1.30   07/01/2020 Initial Diagnosis   Malignant neoplasm of upper-inner quadrant of female breast ([place])   07/05/2020 Surgery   LEFT BREAST LUMPECTOMY WITH RADIOACTIVE SEED AND LEFT AXILLARY SENTINEL LYMPH NODE BIOPSY by Dr Lucia Gaskins    07/05/2020 Pathology Results   FINAL MICROSCOPIC DIAGNOSIS:   A. BREAST, LEFT, LUMPECTOMY:  - Invasive ductal carcinoma, 1.8 cm.  - Margins not involved.  - Biopsy site and biopsy clip.  - See oncology table.   B. LYMPH NODE, LEFT AXILLARY, SENTINEL, EXCISION:  - One lymph node with no metastatic carcinoma (0/1).    07/05/2020 Oncotype testing   Oncotype  Recurrence Score 33 Distant recurrence risk at 9 years with Tamoxifen alone is 21% There is >15% benefit of adjuvant chemotherapy.    07/05/2020 Cancer Staging   Staging form: Breast, AJCC 8th Edition - Pathologic stage from 07/05/2020: Stage IA (pT1c, pN0, cM0, G2, ER+, PR-, HER2-, Oncotype DX score: 33) - Signed by Truitt Merle, MD on 07/29/2020    08/09/2020 - 10/24/2020 Chemotherapy   Weekly Taxol for 12 weeks starting 08/09/20-10/24/20   12/06/2020 - 01/01/2021 Radiation Therapy   Adjuvant Radiation with Dr Lisbeth Renshaw    01/2021 -  Anti-estrogen oral therapy   Tamoxifen 22m daily starting in late 01/2021   04/28/2021 Survivorship   SCP delivered by LCira Rue NP       INTERVAL HISTORY:  Doris Ellis here for a follow up of breast cancer. She was last seen by me on 11/24/2021.  She received her first dose Zometa infusion on last visit.  She developed a bilateral hand  pain and weakness 3 to 4 days after Zometa infusion, she was not able to hold a plate with one hand, and could not open a jar etc. denies any joint edema, or skin change.  She started wearing wrist brace a few days ago, and stopped tamoxifen last week as we instructed.  Her symptom has improved this week.   All other systems were reviewed with the patient and are negative.  MEDICAL HISTORY:  Past Medical History:  Diagnosis Date   Anxiety    Arthritis    Cancer ([place] 05/2020   left breast IDC   Concussion 1985   Depression    Hyperlipidemia    Hypertension    Mild concussion 2002   tripped over bag at work   Osteopenia 07/2011   T score -1.8 FRAX 11%/1.5%   Panic attacks    Personal history of chemotherapy    Personal history of radiation therapy    Seasonal allergies     SURGICAL HISTORY: Past Surgical History:  Procedure Laterality Date   ABDOMINAL HYSTERECTOMY  april 1990   TAH BSO   BACK SURGERY  1996   ruputured disc L-5   BLADDER SUSPENSION  1982  BREAST BIOPSY Left    BREAST LUMPECTOMY WITH RADIOACTIVE SEED AND SENTINEL LYMPH NODE BIOPSY Left 07/05/2020   Procedure: LEFT BREAST LUMPECTOMY WITH RADIOACTIVE SEED AND LEFT AXILLARY SENTINEL LYMPH NODE BIOPSY;  Surgeon: Alphonsa Overall, MD;  Location: [facility];  Service: General;  Laterality: Left;  LEFT BREAST CANCER   BREAST SURGERY  1984   cyst-benign   COLONOSCOPY N/A 03/30/2013   Procedure: COLONOSCOPY;  Surgeon: Rogene Houston, MD;  [facility];  Service: Endoscopy;  Laterality: N/A;  [place] OF UTERUS  12/09/1988   abnormal uterine bleeding   IR IMAGING GUIDED PORT INSERTION  08/05/2020   PORT-A-CATH REMOVAL Right 04/17/2021   Procedure: REMOVAL PORT-A-CATH;  Surgeon: Erroll Luna, MD;  Location: [facility];  Service: General;  Laterality: Right;    I have reviewed the social history and family history with the patient and they are unchanged from  previous note.  ALLERGIES:  is allergic to lipitor [atorvastatin calcium], zocor [simvastatin - high dose], and benadryl [diphenhydramine].  MEDICATIONS:  Current Outpatient Medications  Medication Sig Dispense Refill   ALPRAZolam (XANAX) 0.5 MG tablet Take 0.5 mg by mouth 2 (two) times daily.     amLODipine (NORVASC) 2.5 MG tablet Take 2.5 mg by mouth daily.     cyclobenzaprine (FLEXERIL) 10 MG tablet TAKE 1 TABLET(10 MG) BY MOUTH THREE TIMES DAILY AS NEEDED FOR MUSCLE SPASMS 30 tablet 0   fluticasone (FLONASE) 50 MCG/ACT nasal spray Place into both nostrils daily.     Multiple Vitamins-Minerals (HAIR SKIN AND NAILS FORMULA PO) Take 1 capsule by mouth daily. [employer]'s Bounty Brand Hair Skin and Nails Supplement     naproxen sodium (ALEVE) 220 MG tablet Take 220 mg by mouth.     pravastatin (PRAVACHOL) 10 MG tablet Take 10 mg by mouth daily.     Pyridoxine HCl (VITAMIN B-6 PO) Take 100 mg by mouth daily.     sertraline (ZOLOFT) 50 MG tablet Take 50 mg by mouth daily.     tamoxifen (NOLVADEX) 20 MG tablet TAKE 1 TABLET(20 MG) BY MOUTH DAILY 30 tablet 3   No current facility-administered medications for this visit.    PHYSICAL EXAMINATION: ECOG PERFORMANCE STATUS: 1  Vitals:   12/09/21 0900  BP: 136/74  Pulse: 83  Resp: 18  Temp: 98.1 F (36.7 C)  SpO2: 99%   Wt Readings from Last 3 Encounters:  12/09/21 136 lb (61.7 kg)  11/24/21 135 lb 14.4 oz (61.6 kg)  07/28/21 137 lb 14.4 oz (62.6 kg)     GENERAL:alert, no distress and comfortable SKIN: skin color, texture, turgor are normal, no rashes or significant lesions HAND: no join edema, mild tenderness at some joins   LABORATORY DATA:  I have reviewed the data as listed CBC Latest Ref Rng & Units 12/09/2021 11/24/2021 07/28/2021  WBC 4.0 - 10.5 K/uL 6.2 6.9 6.3  Hemoglobin 12.0 - 15.0 g/dL 13.5 13.1 13.3  Hematocrit 36.0 - 46.0 % 40.4 37.5 39.0  Platelets 150 - 400 K/uL 322 287 277     CMP Latest Ref Rng & Units 11/24/2021  07/28/2021 04/14/2021  Glucose 70 - 99 mg/dL 129(H) 102(H) 109(H)  BUN 8 - 23 mg/dL '11 15 14  ' Creatinine 0.44 - 1.00 mg/dL 0.68 0.79 0.79  Sodium 135 - 145 mmol/L 133(L) 136 132(L)  Potassium 3.5 - 5.1 mmol/L 3.9 4.4 4.6  Chloride 98 - 111 mmol/L 103 102 98  CO2 22 -  32 mmol/L 20(L) 27 25  Calcium 8.9 - 10.3 mg/dL 9.2 9.7 9.9  Total Protein 6.5 - 8.1 g/dL 8.2(H) 7.8 7.5  Total Bilirubin 0.3 - 1.2 mg/dL 0.7 0.4 0.7  Alkaline Phos 38 - 126 U/L 29(L) 29(L) 30(L)  AST 15 - 41 U/L '28 25 27  ' ALT 0 - 44 U/L '13 6 10      ' RADIOGRAPHIC STUDIES: I have personally reviewed the radiological images as listed and agreed with the findings in the report. No results found.    No orders of the defined types were placed in this encounter.  All questions were answered. The patient knows to call the clinic with any problems, questions or concerns. No barriers to learning was detected. The total time spent in the appointment was 25 minutes.     Truitt Merle, MD 12/09/2021

## 2021-12-30 DIAGNOSIS — I10 Essential (primary) hypertension: Secondary | ICD-10-CM | POA: Diagnosis not present

## 2021-12-30 DIAGNOSIS — E785 Hyperlipidemia, unspecified: Secondary | ICD-10-CM | POA: Diagnosis not present

## 2022-01-06 ENCOUNTER — Encounter (INDEPENDENT_AMBULATORY_CARE_PROVIDER_SITE_OTHER): Payer: Self-pay | Admitting: *Deleted

## 2022-01-22 ENCOUNTER — Encounter (INDEPENDENT_AMBULATORY_CARE_PROVIDER_SITE_OTHER): Payer: Self-pay | Admitting: *Deleted

## 2022-01-23 ENCOUNTER — Other Ambulatory Visit (INDEPENDENT_AMBULATORY_CARE_PROVIDER_SITE_OTHER): Payer: Self-pay

## 2022-01-23 DIAGNOSIS — Z1211 Encounter for screening for malignant neoplasm of colon: Secondary | ICD-10-CM

## 2022-01-25 ENCOUNTER — Other Ambulatory Visit: Payer: Self-pay | Admitting: Hematology

## 2022-01-26 ENCOUNTER — Telehealth (INDEPENDENT_AMBULATORY_CARE_PROVIDER_SITE_OTHER): Payer: Self-pay | Admitting: *Deleted

## 2022-01-26 ENCOUNTER — Encounter (INDEPENDENT_AMBULATORY_CARE_PROVIDER_SITE_OTHER): Payer: Self-pay | Admitting: *Deleted

## 2022-01-26 MED ORDER — PEG 3350-KCL-NA BICARB-NACL 420 G PO SOLR: 4000.0000 mL | Freq: Once | ORAL | 0 refills | Status: AC

## 2022-01-26 NOTE — Telephone Encounter (Signed)
Patient needs trilyte 

## 2022-02-10 ENCOUNTER — Telehealth (INDEPENDENT_AMBULATORY_CARE_PROVIDER_SITE_OTHER): Payer: Self-pay | Admitting: *Deleted

## 2022-02-10 NOTE — Telephone Encounter (Signed)
Referring MD/PCP: fagan ? ?Procedure: tcs ? ?Reason/Indication:  screening ? ?Has patient had this procedure before?  Yes, 2014 ? If so, when, by whom and where?   ? ?Is there a family history of colon cancer?  no ? Who?  What age when diagnosed?   ? ?Is patient diabetic? If yes, Type 1 or Type 2   no ?     ?Does patient have prosthetic heart valve or mechanical valve?  no ? ?Do you have a pacemaker/defibrillator?  no ? ?Has patient ever had endocarditis/atrial fibrillation? no ? ?Does patient use oxygen? no ? ?Has patient had joint replacement within last 12 months?  no ? ?Is patient constipated or do they take laxatives? no ? ?Does patient have a history of alcohol/drug use?  yes ? ?Have you had a stroke/heart attack last 6 mths? no ? ?Do you take medicine for weight loss?  no ? ?For female patients,: have you had a hysterectomy yes ?                     are you post menopausal yes ?                     do you still have your menstrual cycle no ? ?Is patient on blood thinner such as Coumadin, Plavix and/or Aspirin? yes ? ?Medications: asa 81 mg daily, amlodipine 2.5 mg daily, sertraline 50 mg daily, pravastatin 10 mg daily, alprazolam 0.5 mg daily, cyclobenzaprine 10 mg prn, tamoxifen 20 mg daily ? ?Allergies: see epic ? ?Medication Adjustment per Dr Rehman/Dr Jenetta Downer asa 2 days ? ?Procedure date & time: 03/04/22 ? ? ?

## 2022-03-02 ENCOUNTER — Encounter (HOSPITAL_COMMUNITY): Payer: Self-pay

## 2022-03-02 ENCOUNTER — Encounter (HOSPITAL_COMMUNITY)
Admission: RE | Admit: 2022-03-02 | Discharge: 2022-03-02 | Disposition: A | Discharge status: Home / Self Care | Payer: Medicare Other | Source: Ambulatory Visit | Attending: Internal Medicine | Admitting: Internal Medicine

## 2022-03-02 DIAGNOSIS — Z01810 Encounter for preprocedural cardiovascular examination: Secondary | ICD-10-CM | POA: Insufficient documentation

## 2022-03-02 NOTE — Patient Instructions (Addendum)
? ? ? ? ? ? [facility] ? 03/02/2022  ?  ? '@PREFPERIOPPHARMACY'$ @ ? ? Your procedure is scheduled on  03/04/2022. ? ? Report to [facility] at  0700 A.M. ? ? Call this number if you have problems the morning of surgery: ? [phone] ? ? Remember: ? Follow the diet and prep instructions given to you  by the office. ?  ? Take these medicines the morning of surgery with A SIP OF WATER  ? ?                            amlodipine, flexeril, zoloft. ?  ? ? Do not wear jewelry, make-up or nail polish. ? Do not wear lotions, powders, or perfumes, or deodorant. ? Do not shave 48 hours prior to surgery.  Men may shave face and neck. ? Do not bring valuables to the hospital. ? [facility] is not responsible for any belongings or valuables. ? ?Contacts, dentures or bridgework may not be worn into surgery.  Leave your suitcase in the car.  After surgery it may be brought to your room. ? ?For patients admitted to the hospital, discharge time will be determined by your treatment team. ? ?Patients discharged the day of surgery will not be allowed to drive home and must have someone with them for 24 hours.  ? ? ?Special instructions:   DO NOT smoke tobacco or vape for 24 hours before your procedure. ? ?Please read over the following fact sheets that you were given. ?Anesthesia Post-op Instructions and Care and Recovery After Surgery ? ?  ? ? ? Colonoscopy, Adult, Care After ?The following information offers guidance on how to care for yourself after your procedure. Your health care provider may also give you more specific instructions. If you have problems or questions, contact your health care provider. ?What can I expect after the procedure? ?After the procedure, it is common to have: ?A small amount of blood in your stool for 24 hours after the procedure. ?Some gas. ?Mild cramping or bloating of your abdomen. ?Follow these instructions at home: ?Eating and drinking ? ?Drink enough fluid to keep your urine pale yellow. ?Follow  instructions from your health care provider about eating or drinking restrictions. ?Resume your normal diet as told by your health care provider. Avoid heavy or fried foods that are hard to digest. ?Activity ?Rest as told by your health care provider. ?Avoid sitting for a long time without moving. Get up to take short walks every 1-2 hours. This is important to improve blood flow and breathing. Ask for help if you feel weak or unsteady. ?Return to your normal activities as told by your health care provider. Ask your health care provider what activities are safe for you. ?Managing cramping and bloating ? ?Try walking around when you have cramps or feel bloated. ?If directed, apply heat to your abdomen as told by your health care provider. Use the heat source that your health care provider recommends, such as a moist heat pack or a heating pad. ?Place a towel between your skin and the heat source. ?Leave the heat on for 20-30 minutes. ?Remove the heat if your skin turns bright red. This is especially important if you are unable to feel pain, heat, or cold. You have a greater risk of getting burned. ?General instructions ?If you were given a sedative during the procedure, it can affect you for several hours. Do not drive  or operate machinery until your health care provider says that it is safe. ?For the first 24 hours after the procedure: ?Do not sign important documents. ?Do not drink alcohol. ?Do your regular daily activities at a slower pace than normal. ?Eat soft foods that are easy to digest. ?Take over-the-counter and prescription medicines only as told by your health care provider. ?Keep all follow-up visits. This is important. ?Contact a health care provider if: ?You have blood in your stool 2-3 days after the procedure. ?Get help right away if: ?You have more than a small spotting of blood in your stool. ?You have large blood clots in your stool. ?You have swelling of your abdomen. ?You have nausea or  vomiting. ?You have a fever. ?You have increasing pain in your abdomen that is not relieved with medicine. ?These symptoms may be an emergency. Get help right away. Call 911. ?Do not wait to see if the symptoms will go away. ?Do not drive yourself to the hospital. ?Summary ?After the procedure, it is common to have a small amount of blood in your stool. You may also have mild cramping and bloating of your abdomen. ?If you were given a sedative during the procedure, it can affect you for several hours. Do not drive or operate machinery until your health care provider says that it is safe. ?Get help right away if you have a lot of blood in your stool, nausea or vomiting, a fever, or increased pain in your abdomen. ?This information is not intended to replace advice given to you by your health care provider. Make sure you discuss any questions you have with your health care provider. ?Document Revised: 06/11/2021 Document Reviewed: 06/11/2021 ?Elsevier Patient Education ? [place]. ?Monitored Anesthesia Care, Care After ?This sheet gives you information about how to care for yourself after your procedure. Your health care provider may also give you more specific instructions. If you have problems or questions, contact your health care provider. ?What can I expect after the procedure? ?After the procedure, it is common to have: ?Tiredness. ?Forgetfulness about what happened after the procedure. ?Impaired judgment for important decisions. ?Nausea or vomiting. ?Some difficulty with balance. ?Follow these instructions at home: ?For the time period you were told by your health care provider: ? ?  ? ?Rest as needed. ?Do not participate in activities where you could fall or become injured. ?Do not drive or use machinery. ?Do not drink alcohol. ?Do not take sleeping pills or medicines that cause drowsiness. ?Do not make important decisions or sign legal documents. ?Do not take care of children on your own. ?Eating and  drinking ?Follow the diet that is recommended by your health care provider. ?Drink enough fluid to keep your urine pale yellow. ?If you vomit: ?Drink water, juice, or soup when you can drink without vomiting. ?Make sure you have little or no nausea before eating solid foods. ?General instructions ?Have a responsible adult stay with you for the time you are told. It is important to have someone help care for you until you are awake and alert. ?Take over-the-counter and prescription medicines only as told by your health care provider. ?If you have sleep apnea, surgery and certain medicines can increase your risk for breathing problems. Follow instructions from your health care provider about wearing your sleep device: ?Anytime you are sleeping, including during daytime naps. ?While taking prescription pain medicines, sleeping medicines, or medicines that make you drowsy. ?Avoid smoking. ?Keep all follow-up visits as told by your  health care provider. This is important. ?Contact a health care provider if: ?You keep feeling nauseous or you keep vomiting. ?You feel light-headed. ?You are still sleepy or having trouble with balance after 24 hours. ?You develop a rash. ?You have a fever. ?You have redness or swelling around the IV site. ?Get help right away if: ?You have trouble breathing. ?You have new-onset confusion at home. ?Summary ?For several hours after your procedure, you may feel tired. You may also be forgetful and have poor judgment. ?Have a responsible adult stay with you for the time you are told. It is important to have someone help care for you until you are awake and alert. ?Rest as told. Do not drive or operate machinery. Do not drink alcohol or take sleeping pills. ?Get help right away if you have trouble breathing, or if you suddenly become confused. ?This information is not intended to replace advice given to you by your health care provider. Make sure you discuss any questions you have with your  health care provider. ?Document Revised: 09/23/2021 Document Reviewed: 09/21/2019 ?Elsevier Patient Education ? [place]. ? ?

## 2022-03-04 ENCOUNTER — Encounter (INDEPENDENT_AMBULATORY_CARE_PROVIDER_SITE_OTHER): Payer: Self-pay | Admitting: *Deleted

## 2022-03-04 ENCOUNTER — Ambulatory Visit (HOSPITAL_BASED_OUTPATIENT_CLINIC_OR_DEPARTMENT_OTHER): Payer: Medicare Other | Admitting: Anesthesiology

## 2022-03-04 ENCOUNTER — Ambulatory Visit (HOSPITAL_COMMUNITY): Payer: Medicare Other | Admitting: Anesthesiology

## 2022-03-04 ENCOUNTER — Encounter (HOSPITAL_COMMUNITY): Payer: Self-pay | Admitting: Internal Medicine

## 2022-03-04 ENCOUNTER — Encounter (HOSPITAL_COMMUNITY)
Admission: RE | Disposition: A | Discharge status: Home / Self Care | Payer: Self-pay | Source: Home / Self Care | Attending: Internal Medicine

## 2022-03-04 ENCOUNTER — Ambulatory Visit (HOSPITAL_COMMUNITY)
Admission: RE | Admit: 2022-03-04 | Discharge: 2022-03-04 | Disposition: A | Discharge status: Home / Self Care | Payer: Medicare Other | Attending: Internal Medicine | Admitting: Internal Medicine

## 2022-03-04 DIAGNOSIS — F32A Depression, unspecified: Secondary | ICD-10-CM | POA: Insufficient documentation

## 2022-03-04 DIAGNOSIS — Z853 Personal history of malignant neoplasm of breast: Secondary | ICD-10-CM | POA: Diagnosis not present

## 2022-03-04 DIAGNOSIS — D125 Benign neoplasm of sigmoid colon: Secondary | ICD-10-CM | POA: Insufficient documentation

## 2022-03-04 DIAGNOSIS — K621 Rectal polyp: Secondary | ICD-10-CM

## 2022-03-04 DIAGNOSIS — D128 Benign neoplasm of rectum: Secondary | ICD-10-CM | POA: Insufficient documentation

## 2022-03-04 DIAGNOSIS — K635 Polyp of colon: Secondary | ICD-10-CM | POA: Insufficient documentation

## 2022-03-04 DIAGNOSIS — I10 Essential (primary) hypertension: Secondary | ICD-10-CM | POA: Diagnosis not present

## 2022-03-04 DIAGNOSIS — Z1211 Encounter for screening for malignant neoplasm of colon: Secondary | ICD-10-CM | POA: Diagnosis not present

## 2022-03-04 DIAGNOSIS — Z923 Personal history of irradiation: Secondary | ICD-10-CM | POA: Diagnosis not present

## 2022-03-04 DIAGNOSIS — K644 Residual hemorrhoidal skin tags: Secondary | ICD-10-CM | POA: Diagnosis not present

## 2022-03-04 DIAGNOSIS — Z09 Encounter for follow-up examination after completed treatment for conditions other than malignant neoplasm: Secondary | ICD-10-CM | POA: Diagnosis not present

## 2022-03-04 DIAGNOSIS — D120 Benign neoplasm of cecum: Secondary | ICD-10-CM | POA: Insufficient documentation

## 2022-03-04 DIAGNOSIS — F419 Anxiety disorder, unspecified: Secondary | ICD-10-CM | POA: Diagnosis not present

## 2022-03-04 DIAGNOSIS — Z86010 Personal history of colonic polyps: Secondary | ICD-10-CM | POA: Diagnosis not present

## 2022-03-04 DIAGNOSIS — Z139 Encounter for screening, unspecified: Secondary | ICD-10-CM | POA: Diagnosis not present

## 2022-03-04 HISTORY — PX: COLONOSCOPY WITH PROPOFOL: SHX5780

## 2022-03-04 HISTORY — PX: POLYPECTOMY: SHX5525

## 2022-03-04 LAB — HM COLONOSCOPY

## 2022-03-04 SURGERY — COLONOSCOPY WITH PROPOFOL
Anesthesia: General

## 2022-03-04 MED ORDER — PROPOFOL 500 MG/50ML IV EMUL
INTRAVENOUS | Status: DC | PRN
  Administered 2022-03-04: 200 ug/kg/min via INTRAVENOUS

## 2022-03-04 MED ORDER — LIDOCAINE 2% (20 MG/ML) 5 ML SYRINGE
INTRAMUSCULAR | Status: DC | PRN
  Administered 2022-03-04: 50 mg via INTRAVENOUS

## 2022-03-04 MED ORDER — PHENYLEPHRINE 80 MCG/ML (10ML) SYRINGE FOR IV PUSH (FOR BLOOD PRESSURE SUPPORT)
PREFILLED_SYRINGE | INTRAVENOUS | Status: DC | PRN
  Administered 2022-03-04 (×2): 80 ug via INTRAVENOUS
  Administered 2022-03-04: 160 ug via INTRAVENOUS

## 2022-03-04 MED ORDER — LACTATED RINGERS IV SOLN: INTRAVENOUS | Status: DC | PRN

## 2022-03-04 MED ORDER — PROPOFOL 10 MG/ML IV BOLUS
INTRAVENOUS | Status: DC | PRN
  Administered 2022-03-04: 20 mg via INTRAVENOUS
  Administered 2022-03-04: 50 mg via INTRAVENOUS
  Administered 2022-03-04: 20 mg via INTRAVENOUS
  Administered 2022-03-04: 30 mg via INTRAVENOUS
  Administered 2022-03-04: 20 mg via INTRAVENOUS

## 2022-03-04 NOTE — Discharge Instructions (Signed)
Resume aspirin on 03/05/2022. ?Remember to keep ibuprofen use to minimum. ?Resume other medications and diet as before. ?No driving for 24 hours. ?Physician will call with biopsy results. ?

## 2022-03-04 NOTE — Anesthesia Postprocedure Evaluation (Signed)
Anesthesia Post Note ? ?Patient: Doris Ellis ? ?Procedure(s) Performed: COLONOSCOPY WITH PROPOFOL ?POLYPECTOMY ? ?Patient location during evaluation: Phase II ?Anesthesia Type: General ?Level of consciousness: awake ?Pain management: pain level controlled ?Vital Signs Assessment: post-procedure vital signs reviewed and stable ?Respiratory status: spontaneous breathing and respiratory function stable ?Cardiovascular status: blood pressure returned to baseline and stable ?Postop Assessment: no headache and no apparent nausea or vomiting ?Anesthetic complications: no ?Comments: Late entry ? ? ?No notable events documented. ? ? ?Last Vitals:  ?Vitals:  ? 03/04/22 0702 03/04/22 0917  ?BP: 109/65 (!) 95/51  ?Pulse: 69 69  ?Resp: 18 14  ?Temp: 36.8 ?C 36.7 ?C  ?SpO2: 100% 98%  ?  ?Last Pain:  ?Vitals:  ? 03/04/22 0917  ?TempSrc: Oral  ?PainSc: 0-No pain  ? ? ?  ?  ?  ?  ?  ?  ? ?Louann Sjogren ? ? ? ? ?

## 2022-03-04 NOTE — Transfer of Care (Signed)
Immediate Anesthesia Transfer of Care Note ? ?Patient: Doris Ellis ? ?Procedure(s) Performed: COLONOSCOPY WITH PROPOFOL ?POLYPECTOMY ? ?Patient Location: Short Stay ? ?Anesthesia Type:MAC ? ?Level of Consciousness: awake and oriented ? ?Airway & Oxygen Therapy: Patient Spontanous Breathing and Patient connected to nasal cannula oxygen ? ?Post-op Assessment: Report given to RN, Post -op Vital signs reviewed and stable and Patient moving all extremities ? ?Post vital signs: Reviewed and stable ? ?Last Vitals:  ?Vitals Value Taken Time  ?BP 95/51 03/04/22 0917  ?Temp 36.7 ?C 03/04/22 0917  ?Pulse 69 03/04/22 0917  ?Resp 14 03/04/22 0917  ?SpO2 98 % 03/04/22 0917  ? ? ?Last Pain:  ?Vitals:  ? 03/04/22 0917  ?TempSrc: Oral  ?PainSc: 0-No pain  ?   ? ?Patients Stated Pain Goal: 7 (03/04/22 9242) ? ?Complications: No notable events documented. ?

## 2022-03-04 NOTE — Op Note (Signed)
[facility] ?Patient Name: Doris Ellis ?Procedure Date: 03/04/2022 8:12 AM ?MRN: [id number] ?Date of Birth: 1941-05-18 ?Attending MD: Hildred Laser , MD ?CSN: [id number] ?Age: 81 ?Admit Type: Outpatient ?Procedure:                Colonoscopy ?Indications:              High risk colon cancer surveillance: Personal  ?                          history of colonic polyps ?Providers:                Hildred Laser, MD, Lurline Del, RN, Randa Spike,  ?                          Technician ?Referring MD:             Asencion Noble, MD ?Medicines:                Propofol per Anesthesia ?Complications:            No immediate complications. ?Estimated Blood Loss:     Estimated blood loss was minimal. ?Procedure:                Pre-Anesthesia Assessment: ?                          - Prior to the procedure, a History and Physical  ?                          was performed, and patient medications and  ?                          allergies were reviewed. The patient's tolerance of  ?                          previous anesthesia was also reviewed. The risks  ?                          and benefits of the procedure and the sedation  ?                          options and risks were discussed with the patient.  ?                          All questions were answered, and informed consent  ?                          was obtained. Prior Anticoagulants: The patient has  ?                          taken no previous anticoagulant or antiplatelet  ?                          agents except for aspirin. ASA Grade Assessment: II  ?                          -  A patient with mild systemic disease. After  ?                          reviewing the risks and benefits, the patient was  ?                          deemed in satisfactory condition to undergo the  ?                          procedure. ?                          After obtaining informed consent, the colonoscope  ?                          was passed under direct vision. Throughout the  ?                           procedure, the patient's blood pressure, pulse, and  ?                          oxygen saturations were monitored continuously. The  ?                          [id number]-HQ190L ([id number]) scope was introduced through  ?                          the anus and advanced to the the cecum, identified  ?                          by appendiceal orifice and ileocecal valve. The  ?                          colonoscopy was performed without difficulty. The  ?                          patient tolerated the procedure well. The quality  ?                          of the bowel preparation was good. The ileocecal  ?                          valve, appendiceal orifice, and rectum were  ?                          photographed. ?Scope In: 8:47:22 AM ?Scope Out: 9:13:26 AM ?Scope Withdrawal Time: 0 hours 22 minutes 0 seconds  ?Total Procedure Duration: 0 hours 26 minutes 4 seconds  ?Findings: ?     The perianal and digital rectal examinations were normal. ?     A small polyp was found in the cecum. Biopsies were taken with a cold  ?     forceps for histology. The pathology specimen was placed into Bottle  ?     Number 1. ?     Two flat polyps were found in  the sigmoid colon and cecum. The polyps  ?     were small in size. These polyps were removed with a cold snare.  ?     Resection and retrieval were complete. The pathology specimen was placed  ?     into Bottle Number 1. ?     A 6 to 8 mm polyp was found in the mid rectum. The polyp was flat. The  ?     polyp was removed with a cold snare and The polyp was removed with a  ?     piecemeal technique using a cold snare. Resection and retrieval were  ?     complete. The pathology specimen was placed into Bottle Number 2. ?     A 6 to 9 mm polyp was found in the distal rectum. The polyp was  ?     multi-lobulated. The polyp was removed with a piecemeal technique using  ?     a cold snare. Resection and retrieval were complete. [Clip Device]. The  ?     pathology specimen was  placed into Bottle Number 3. ?     External hemorrhoids were found during retroflexion. The hemorrhoids  ?     were small. ?Impression:               - One small polyp in the cecum. Biopsied. ?                          - Two small polyps in the sigmoid colon and in the  ?                          cecum, removed with a cold snare. Resected and  ?                          retrieved. ?                          - One 6 to 8 mm polyp in the mid rectum, removed  ?                          with a cold snare and removed piecemeal using a  ?                          cold snare. Resected and retrieved. ?                          - One 6 to 9 mm polyp in the distal rectum, removed  ?                          piecemeal using a cold snare. Resected and  ?                          retrieved. ?                          - External hemorrhoids. ?Moderate Sedation: ?     Per Anesthesia Care ?Recommendation:           - Patient has a contact number available  for  ?                          emergencies. The signs and symptoms of potential  ?                          delayed complications were discussed with the  ?                          patient. Return to normal activities tomorrow.  ?                          Written discharge instructions were provided to the  ?                          patient. ?                          - Resume previous diet today. ?                          - Continue present medications. ?                          - No aspirin, ibuprofen, naproxen, or other  ?                          non-steroidal anti-inflammatory drugs for 1 day. ?                          - Await pathology results. ?                          - Repeat colonoscopy is recommended. The  ?                          colonoscopy date will be determined after pathology  ?                          results from today's exam become available for  ?                          review. ?Procedure Code(s):        --- Professional --- ?                           [phone], Colonoscopy, flexible; with removal of  ?                          tumor(s), polyp(s), or other lesion(s) by snare  ?                          technique ?                          45380, 67, Colonoscopy, flexible; with biopsy,  ?  single or multiple ?Diagnosis Code(s):        --- Professional --- ?                          Z86.010, Personal history of colonic polyps ?                          K63.5, Polyp of colon ?                          K62.1, Rectal polyp ?                          K64.4, Residual hemorrhoidal skin tags ?CPT copyright 2019 [employer]. All rights reserved. ?The codes documented in this report are preliminary and upon coder review may  ?be revised to meet current compliance requirements. ?Hildred Laser, MD ?Hildred Laser, MD ?03/04/2022 9:24:23 AM ?This report has been signed electronically. ?Number of Addenda: 0 ?

## 2022-03-04 NOTE — H&P (Signed)
Doris Ellis is an 81 y.o. female.   ?Chief Complaint: Patient is here for colonoscopy. ?HPI: Patient is 81 year old Caucasian female with history of colonic adenomas and is here for surveillance colonoscopy.  Last exam was in 2014 with removal of 2 tubular adenomas.  She is advised to return in 7 years.  She denies abdominal pain or rectal bleeding.  She has developed constipation following radiation therapy for breast carcinoma last year.  She is drinking plenty of fluids along with high-fiber diet and stool softener. ?Family history significant for colonic polyps in father. ?Personal history significant for breast carcinoma. ?Last aspirin dose was yesterday. ? ?Past Medical History:  ?Diagnosis Date  ? Anxiety   ? Arthritis   ? Cancer ([place]) 05/2020  ? left breast IDC  ? Concussion 1985  ? Depression   ? Hyperlipidemia   ? Hypertension   ? Mild concussion 2002  ? tripped over bag at work  ? Osteopenia 07/2011  ? T score -1.8 FRAX 11%/1.5%  ? Panic attacks   ? Personal history of chemotherapy   ? Personal history of radiation therapy   ? Seasonal allergies   ? ? ?Past Surgical History:  ?Procedure Laterality Date  ? ABDOMINAL HYSTERECTOMY  01/1989  ? TAH BSO  ? [place]  ? ruputured disc L-5  ? BLADDER SUSPENSION  1982  ? BREAST BIOPSY Left   ? BREAST LUMPECTOMY WITH RADIOACTIVE SEED AND SENTINEL LYMPH NODE BIOPSY Left 07/05/2020  ? Procedure: LEFT BREAST LUMPECTOMY WITH RADIOACTIVE SEED AND LEFT AXILLARY SENTINEL LYMPH NODE BIOPSY;  Surgeon: Alphonsa Overall, MD;  Location: [facility];  Service: General;  Laterality: Left;  LEFT BREAST CANCER  ? BREAST SURGERY  1984  ? cyst-benign  ? COLONOSCOPY N/A 03/30/2013  ? Procedure: COLONOSCOPY;  Surgeon: Rogene Houston, MD;  [facility];  Service: Endoscopy;  Laterality: N/A;  930  ? DILATION AND CURETTAGE OF UTERUS  12/09/1988  ? abnormal uterine bleeding  ? IR IMAGING GUIDED PORT INSERTION  08/05/2020  ? PORT-A-CATH REMOVAL Right  04/17/2021  ? Procedure: REMOVAL PORT-A-CATH;  Surgeon: Erroll Luna, MD;  Location: [place];  Service: General;  Laterality: Right;  ? TONSILLECTOMY    ? ? ?Family History  ?Problem Relation Age of Onset  ? Cancer Father   ?     non-hogkins lymphoma, & CLL  ? Diabetes Maternal Uncle   ? Hypertension Maternal Grandmother   ? Heart disease Maternal Grandfather   ? Cancer Maternal Grandfather   ?     lung cancer  ? Osteoporosis Maternal Aunt   ? ?Social History:  reports that she has never smoked. She has never used smokeless tobacco. She reports current alcohol use. She reports current drug use. Drug: Marijuana. ? ?Allergies:  ?Allergies  ?Allergen Reactions  ? Lipitor [Atorvastatin Calcium] Other (See Comments)  ?  Muscle weakness  ? Zocor [Simvastatin - High Dose]   ?  Muscle weakness  ? Benadryl [Diphenhydramine] Rash  ? ? ?Medications Prior to Admission  ?Medication Sig Dispense Refill  ? ALPRAZolam (XANAX) 0.5 MG tablet Take 0.5 mg by mouth See admin instructions. Take 0.5 mg at bedtime, may take a second 0.5 mg dose later in the night as needed for sleep    ? amLODipine (NORVASC) 2.5 MG tablet Take 2.5 mg by mouth daily.    ? aspirin EC 81 MG tablet Take 81 mg by mouth daily. Swallow whole.    ?  Calcium Carbonate-Vit D-Min (QC CALCIUM-MAGNESIUM-ZINC-D3 PO) Take 1 tablet by mouth daily.    ? Carboxymethylcellul-Glycerin (LUBRICATING EYE DROPS OP) Place 1 drop into both eyes daily as needed (dry eyes).    ? cyclobenzaprine (FLEXERIL) 10 MG tablet TAKE 1 TABLET(10 MG) BY MOUTH THREE TIMES DAILY AS NEEDED FOR MUSCLE SPASMS 30 tablet 0  ? docusate sodium (COLACE) 100 MG capsule Take 100 mg by mouth daily as needed for mild constipation.    ? ibuprofen (ADVIL) 800 MG tablet Take 800 mg by mouth every 8 (eight) hours as needed for pain.    ? Multiple Vitamin (MULTIVITAMIN WITH MINERALS) TABS tablet Take 1 tablet by mouth daily.    ? pravastatin (PRAVACHOL) 10 MG tablet Take 10 mg by mouth daily.     ? pyridOXINE (VITAMIN B-6) 100 MG tablet Take 100 mg by mouth daily.    ? sertraline (ZOLOFT) 50 MG tablet Take 50 mg by mouth daily.    ? Sodium Chloride-Sodium Bicarb (AYR SALINE NASAL RINSE NA) Place 1 Dose into the nose daily.    ? tamoxifen (NOLVADEX) 20 MG tablet TAKE 1 TABLET(20 MG) BY MOUTH DAILY 30 tablet 3  ? ? ?No results found for this or any previous visit (from the past 48 hour(s)). ?No results found. ? ?Review of Systems ? ?Blood pressure 109/65, pulse 69, temperature 98.2 ?F (36.8 ?C), temperature source Oral, resp. rate 18, height '5\' 4"'$  (1.626 m), weight 56.7 kg, last menstrual period 03/05/1989, SpO2 100 %. ?Physical Exam ?HENT:  ?   Mouth/Throat:  ?   Mouth: Mucous membranes are moist.  ?   Pharynx: Oropharynx is clear.  ?Eyes:  ?   General: No scleral icterus. ?   Conjunctiva/sclera: Conjunctivae normal.  ?Cardiovascular:  ?   Rate and Rhythm: Normal rate and regular rhythm.  ?   Heart sounds: Normal heart sounds. No murmur heard. ?Pulmonary:  ?   Effort: Pulmonary effort is normal.  ?   Breath sounds: Normal breath sounds.  ?Abdominal:  ?   General: There is no distension.  ?   Palpations: Abdomen is soft. There is no mass.  ?   Tenderness: There is no abdominal tenderness.  ?Musculoskeletal:     ?   General: No swelling.  ?   Cervical back: Neck supple.  ?Lymphadenopathy:  ?   Cervical: No cervical adenopathy.  ?Skin: ?   General: Skin is warm and dry.  ?Neurological:  ?   Mental Status: She is alert.  ?  ? ?Assessment/Plan ? ?History of colonic adenomas ?Surveillance colonoscopy. ? ?Hildred Laser, MD ?03/04/2022, 8:37 AM ? ? ? ?

## 2022-03-04 NOTE — Anesthesia Preprocedure Evaluation (Signed)
Anesthesia Evaluation  ?Patient identified by MRN, date of birth, ID band ?Patient awake ? ? ? ?Reviewed: ?Allergy & Precautions, H&P , NPO status , Patient's Chart, lab work & pertinent test results, reviewed documented beta blocker date and time  ? ?Airway ?Mallampati: II ? ?TM Distance: >3 FB ?Neck ROM: full ? ? ? Dental ?no notable dental hx. ? ?  ?Pulmonary ?neg pulmonary ROS,  ?  ?Pulmonary exam normal ?breath sounds clear to auscultation ? ? ? ? ? ? Cardiovascular ?Exercise Tolerance: Good ?hypertension, negative cardio ROS ? ? ?Rhythm:regular Rate:Normal ? ? ?  ?Neuro/Psych ?PSYCHIATRIC DISORDERS Anxiety Depression negative neurological ROS ?   ? GI/Hepatic ?negative GI ROS, Neg liver ROS,   ?Endo/Other  ?negative endocrine ROS ? Renal/GU ?negative Renal ROS  ?negative genitourinary ?  ?Musculoskeletal ? ? Abdominal ?  ?Peds ? Hematology ?negative hematology ROS ?(+)   ?Anesthesia Other Findings ? ? Reproductive/Obstetrics ?negative OB ROS ? ?  ? ? ? ? ? ? ? ? ? ? ? ? ? ?  ?  ? ? ? ? ? ? ? ? ?Anesthesia Physical ?Anesthesia Plan ? ?ASA: 2 ? ?Anesthesia Plan: General  ? ?Post-op Pain Management:   ? ?Induction:  ? ?PONV Risk Score and Plan: Propofol infusion ? ?Airway Management Planned:  ? ?Additional Equipment:  ? ?Intra-op Plan:  ? ?Post-operative Plan:  ? ?Informed Consent: I have reviewed the patients History and Physical, chart, labs and discussed the procedure including the risks, benefits and alternatives for the proposed anesthesia with the patient or authorized representative who has indicated his/her understanding and acceptance.  ? ? ? ?Dental Advisory Given ? ?Plan Discussed with: CRNA ? ?Anesthesia Plan Comments:   ? ? ? ? ? ? ?Anesthesia Quick Evaluation ? ?

## 2022-03-05 LAB — SURGICAL PATHOLOGY

## 2022-03-11 ENCOUNTER — Encounter (HOSPITAL_COMMUNITY): Payer: Self-pay | Admitting: Internal Medicine

## 2022-04-16 ENCOUNTER — Other Ambulatory Visit: Payer: Self-pay | Admitting: Hematology

## 2022-04-16 ENCOUNTER — Other Ambulatory Visit: Payer: Self-pay | Admitting: Nurse Practitioner

## 2022-04-16 DIAGNOSIS — Z1231 Encounter for screening mammogram for malignant neoplasm of breast: Secondary | ICD-10-CM

## 2022-05-08 DIAGNOSIS — R7303 Prediabetes: Secondary | ICD-10-CM | POA: Diagnosis not present

## 2022-05-08 DIAGNOSIS — Z79899 Other long term (current) drug therapy: Secondary | ICD-10-CM | POA: Diagnosis not present

## 2022-05-08 DIAGNOSIS — I10 Essential (primary) hypertension: Secondary | ICD-10-CM | POA: Diagnosis not present

## 2022-05-08 DIAGNOSIS — Z853 Personal history of malignant neoplasm of breast: Secondary | ICD-10-CM | POA: Diagnosis not present

## 2022-05-08 DIAGNOSIS — E785 Hyperlipidemia, unspecified: Secondary | ICD-10-CM | POA: Diagnosis not present

## 2022-05-15 DIAGNOSIS — I10 Essential (primary) hypertension: Secondary | ICD-10-CM | POA: Diagnosis not present

## 2022-05-15 DIAGNOSIS — E785 Hyperlipidemia, unspecified: Secondary | ICD-10-CM | POA: Diagnosis not present

## 2022-05-15 DIAGNOSIS — Z853 Personal history of malignant neoplasm of breast: Secondary | ICD-10-CM | POA: Diagnosis not present

## 2022-05-15 DIAGNOSIS — R7309 Other abnormal glucose: Secondary | ICD-10-CM | POA: Diagnosis not present

## 2022-05-15 DIAGNOSIS — Z0000 Encounter for general adult medical examination without abnormal findings: Secondary | ICD-10-CM | POA: Diagnosis not present

## 2022-05-15 DIAGNOSIS — Z6823 Body mass index (BMI) 23.0-23.9, adult: Secondary | ICD-10-CM | POA: Diagnosis not present

## 2022-05-17 IMAGING — MG MM PLC BREAST LOC DEV 1ST LESION INC MAMMO GUIDE*L*
7 series · 7 of 7 positions shown · non-contrast
Comparison: Previous exam(s).

CLINICAL DATA: 78-year-old biopsy-proven invasive ductal carcinoma
involving the UPPER INNER QUADRANT of the LEFT breast. Radioactive
seed localization is performed in anticipation of lumpectomy which
is scheduled for tomorrow.

EXAM:
MAMMOGRAPHIC GUIDED RADIOACTIVE SEED LOCALIZATION OF THE LEFT BREAST

[L ML (1 of 3)]
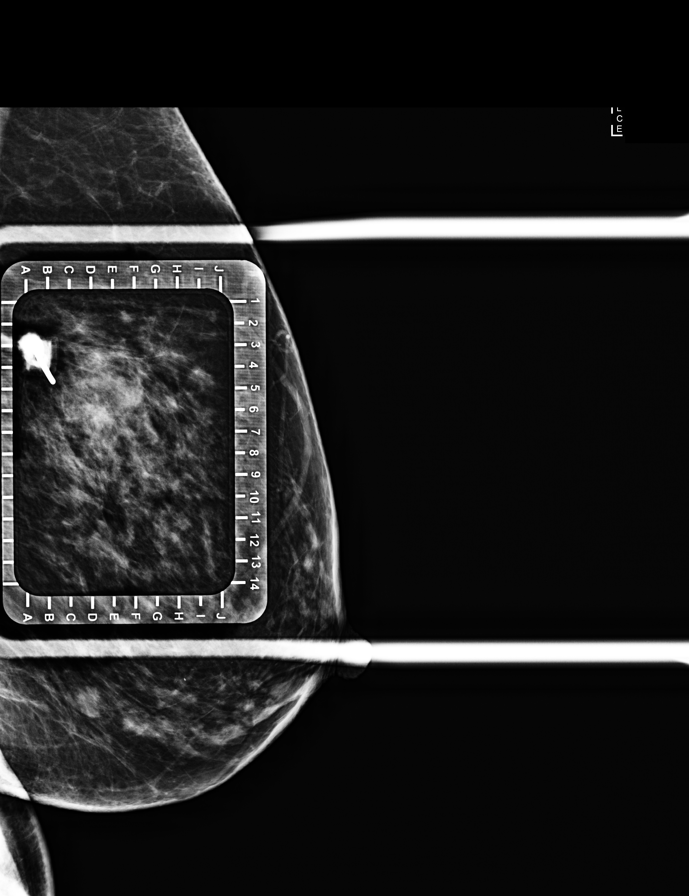

[L CC (1 of 4)]
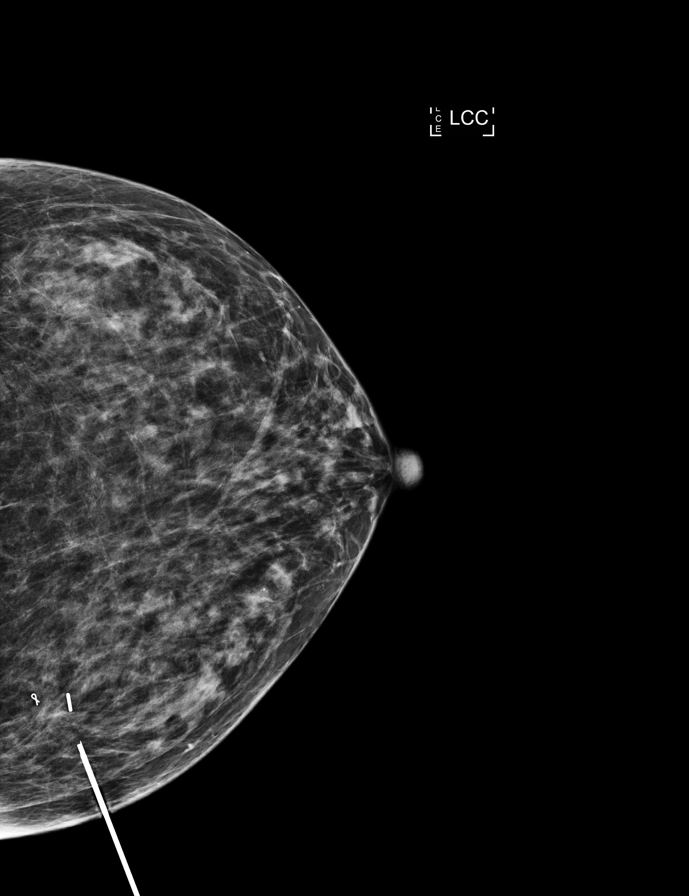

[L CC (2 of 4)]
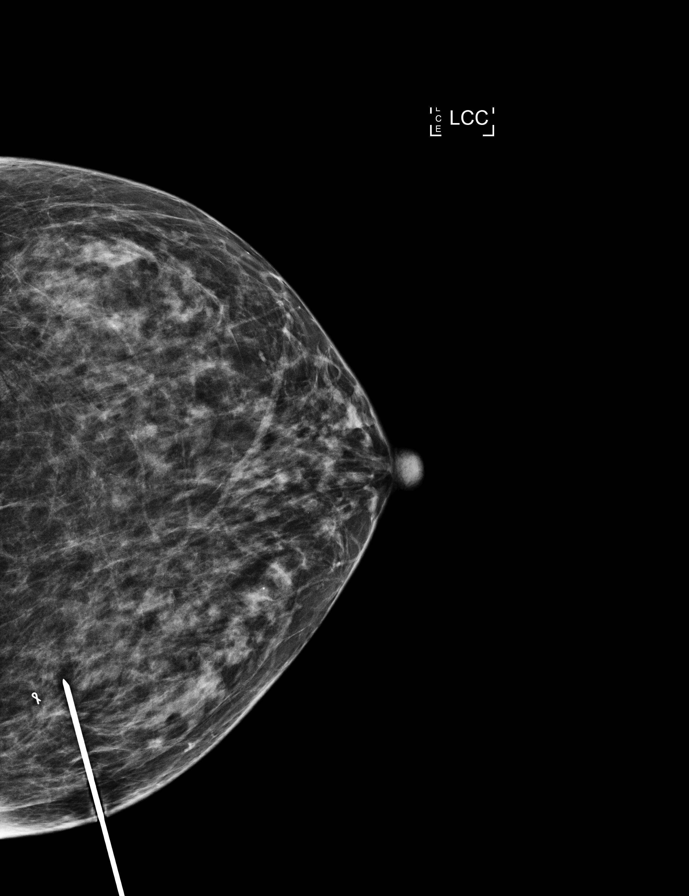

[L CC (3 of 4)]
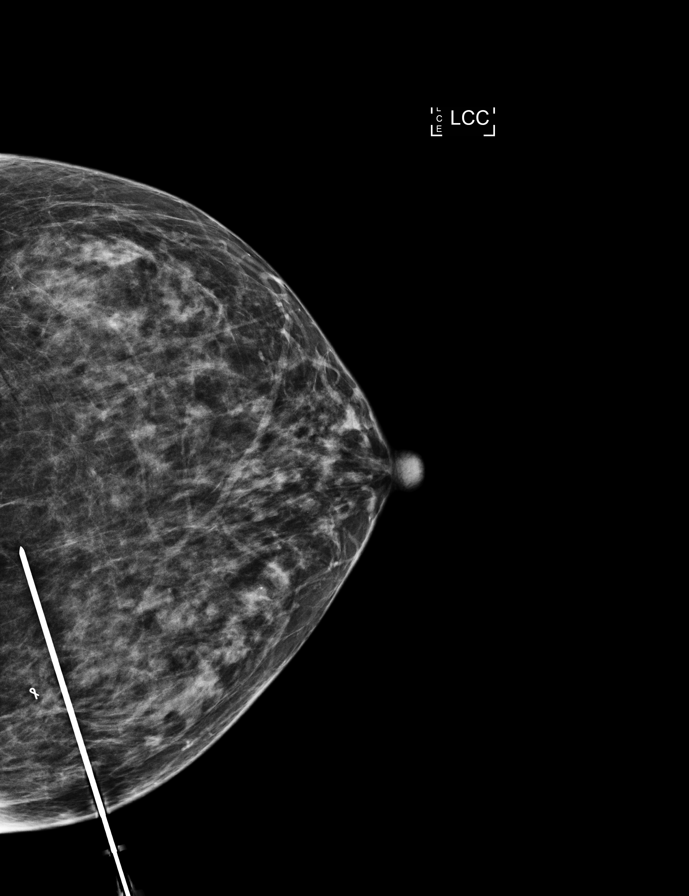

[L CC (4 of 4)]
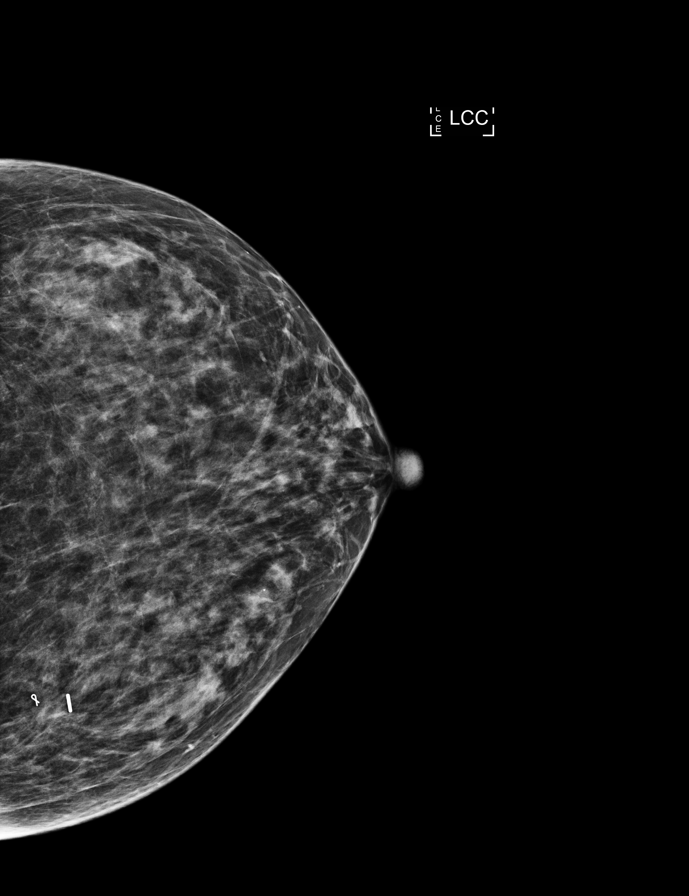

[L ML (2 of 3)]
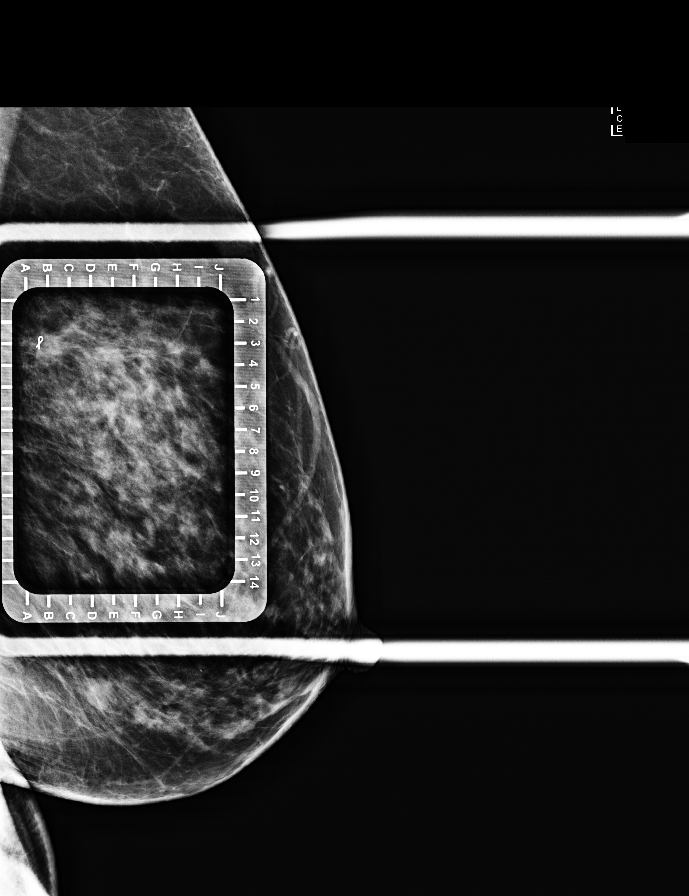

[L ML (3 of 3)]
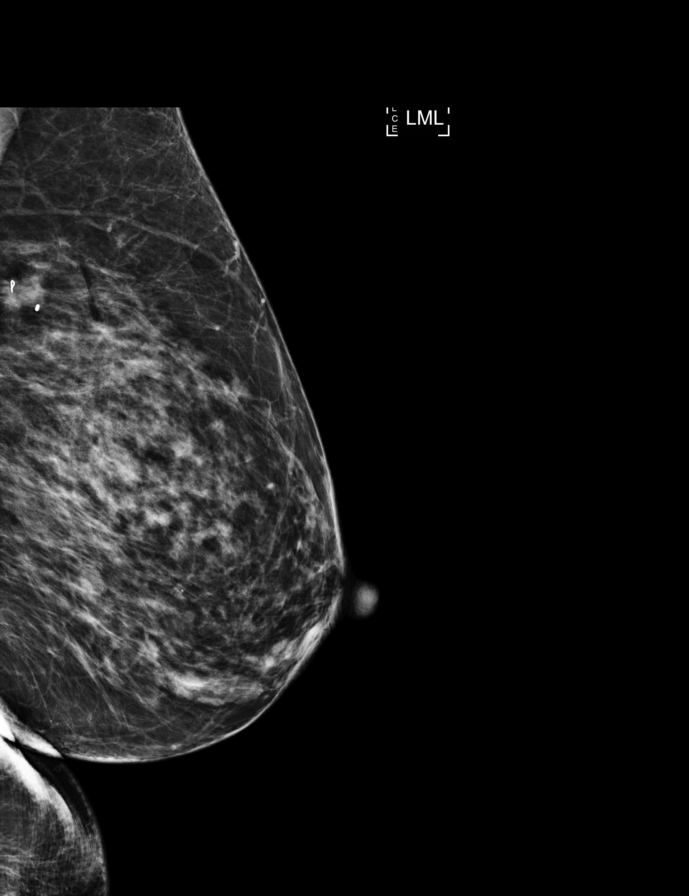

[7 of 7 positions shown; findings below may reference images not displayed]

FINDINGS: Patient presents for radioactive seed localization prior to LEFT
breast lumpectomy. I met with the patient and we discussed the
procedure of seed localization including benefits and alternatives.
We discussed the high likelihood of a successful procedure. We
discussed the risks of the procedure including infection, bleeding,
tissue injury and further surgery. We discussed the low dose of
radioactivity involved in the procedure. Informed, written consent
was given.

The usual time-out protocol was performed immediately prior to the
procedure.

Using mammographic guidance, sterile technique with chlorhexidine as
skin antisepsis, 1% lidocaine as local anesthetic an E-HMS
radioactive seed was used to localize the ribbon shaped tissue
marker clip associated with the biopsy-proven IDC in the UPPER INNER
QUADRANT of the LEFT breast using a medial approach. The follow-up
mammogram images confirm that the seed is appropriately positioned
at the anterior margin of the mass, approximately 6 mm anterior to
the clip (which is located at the posterior margin of the mass). The
images were marked for Dr. Burl.

Follow-up survey of the patient confirms presence of the radioactive
seed.

Order number of E-HMS seed: [id number]

Total activity: 0.249 mCi

Reference Date: 07/02/2020

The patient tolerated the procedure well and was released from the
[REDACTED]. She was given instructions regarding seed removal.
IMPRESSION: Radioactive seed localization of the biopsy-proven invasive ductal
carcinoma involving the UPPER INNER QUADRANT of the LEFT breast
breast. No apparent complications.

## 2022-05-18 IMAGING — DX MM BREAST SURGICAL SPECIMEN
1 series · 2 of 2 positions shown · non-contrast
Comparison: Previous exam(s).

CLINICAL DATA: Status post seed localized LEFT lumpectomy.

EXAM:
SPECIMEN RADIOGRAPH OF THE LEFT BREAST

[Series 2: specimen digital x-ray, derived · left · 0.07mm/px · 2 of 2 slices shown]
[im 1/2]
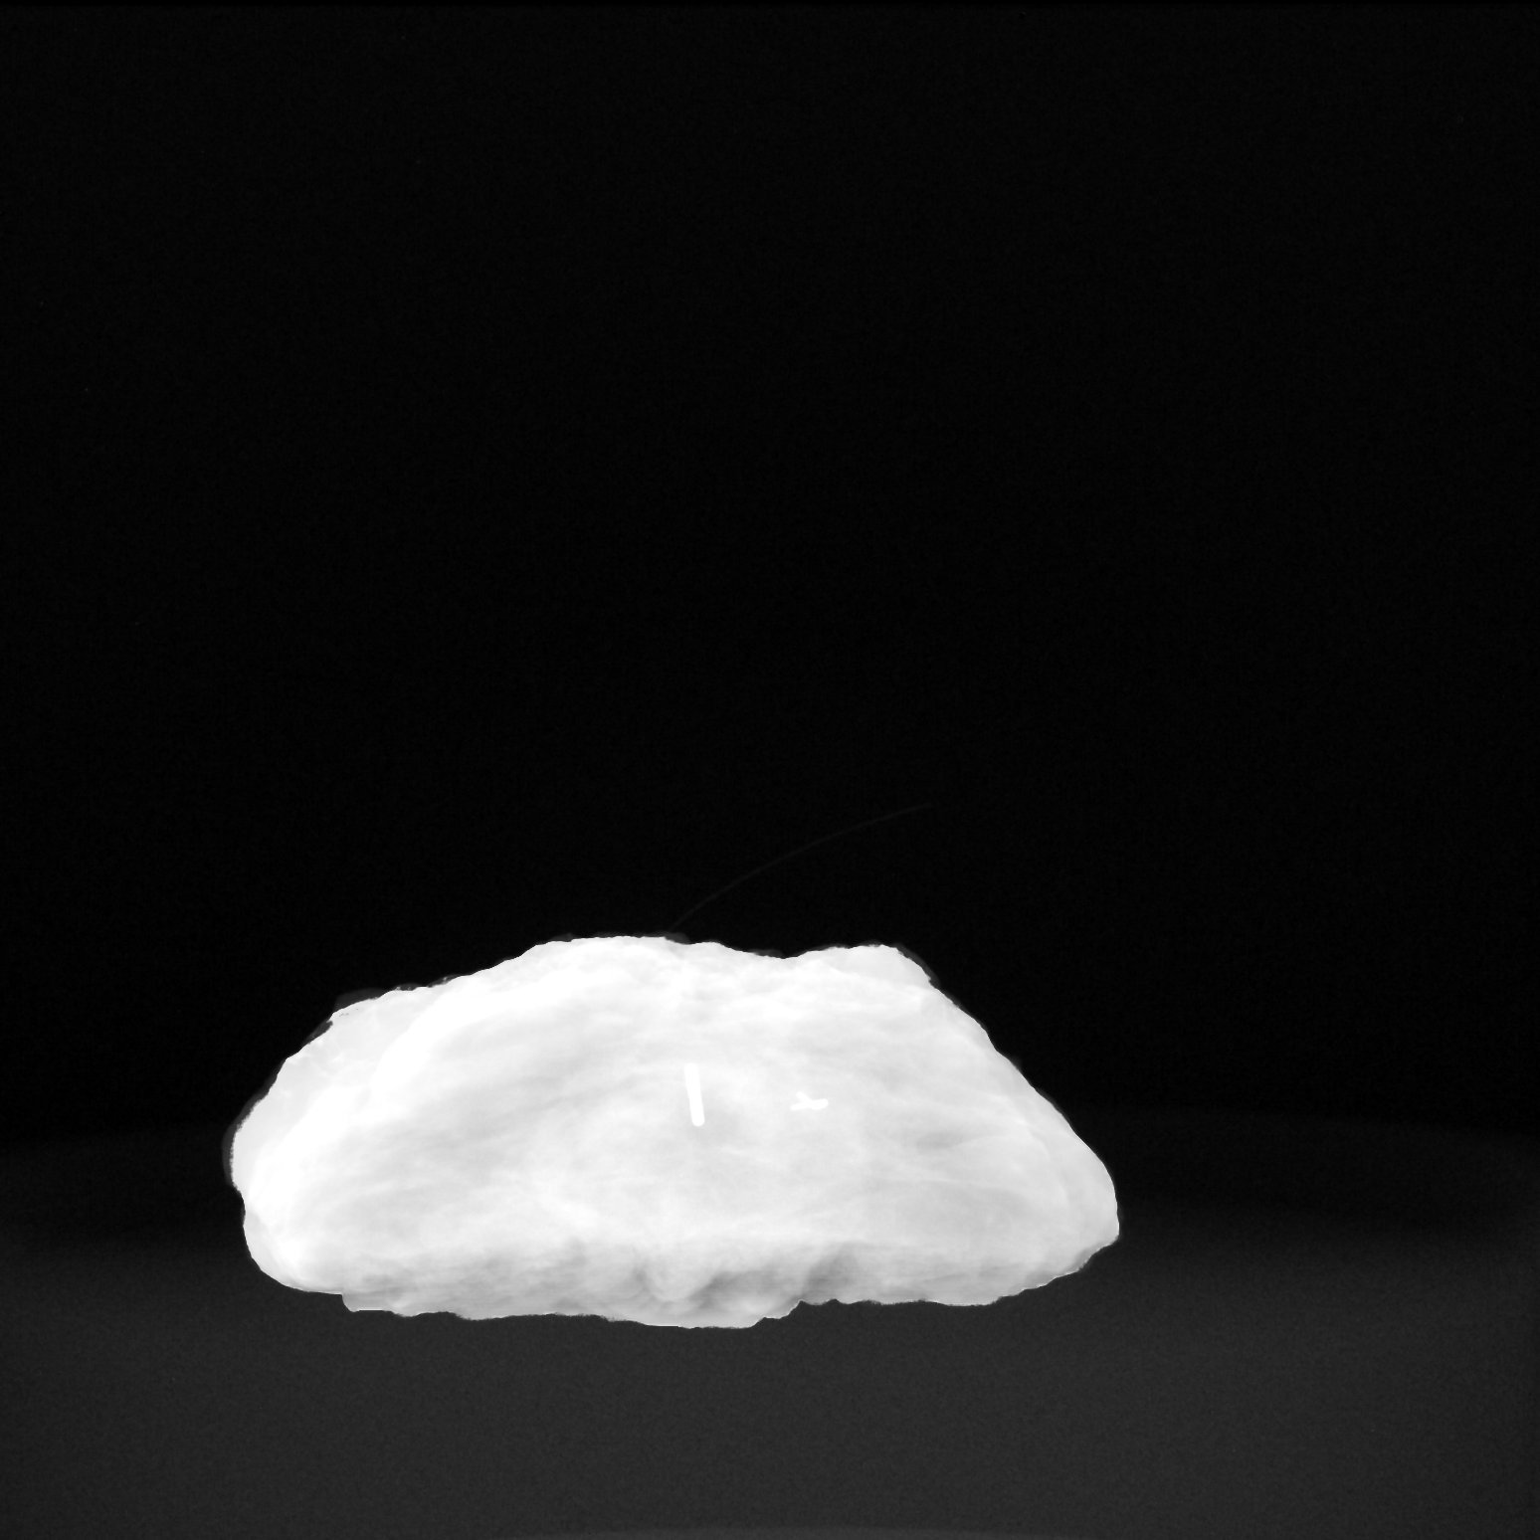
[im 2/2]
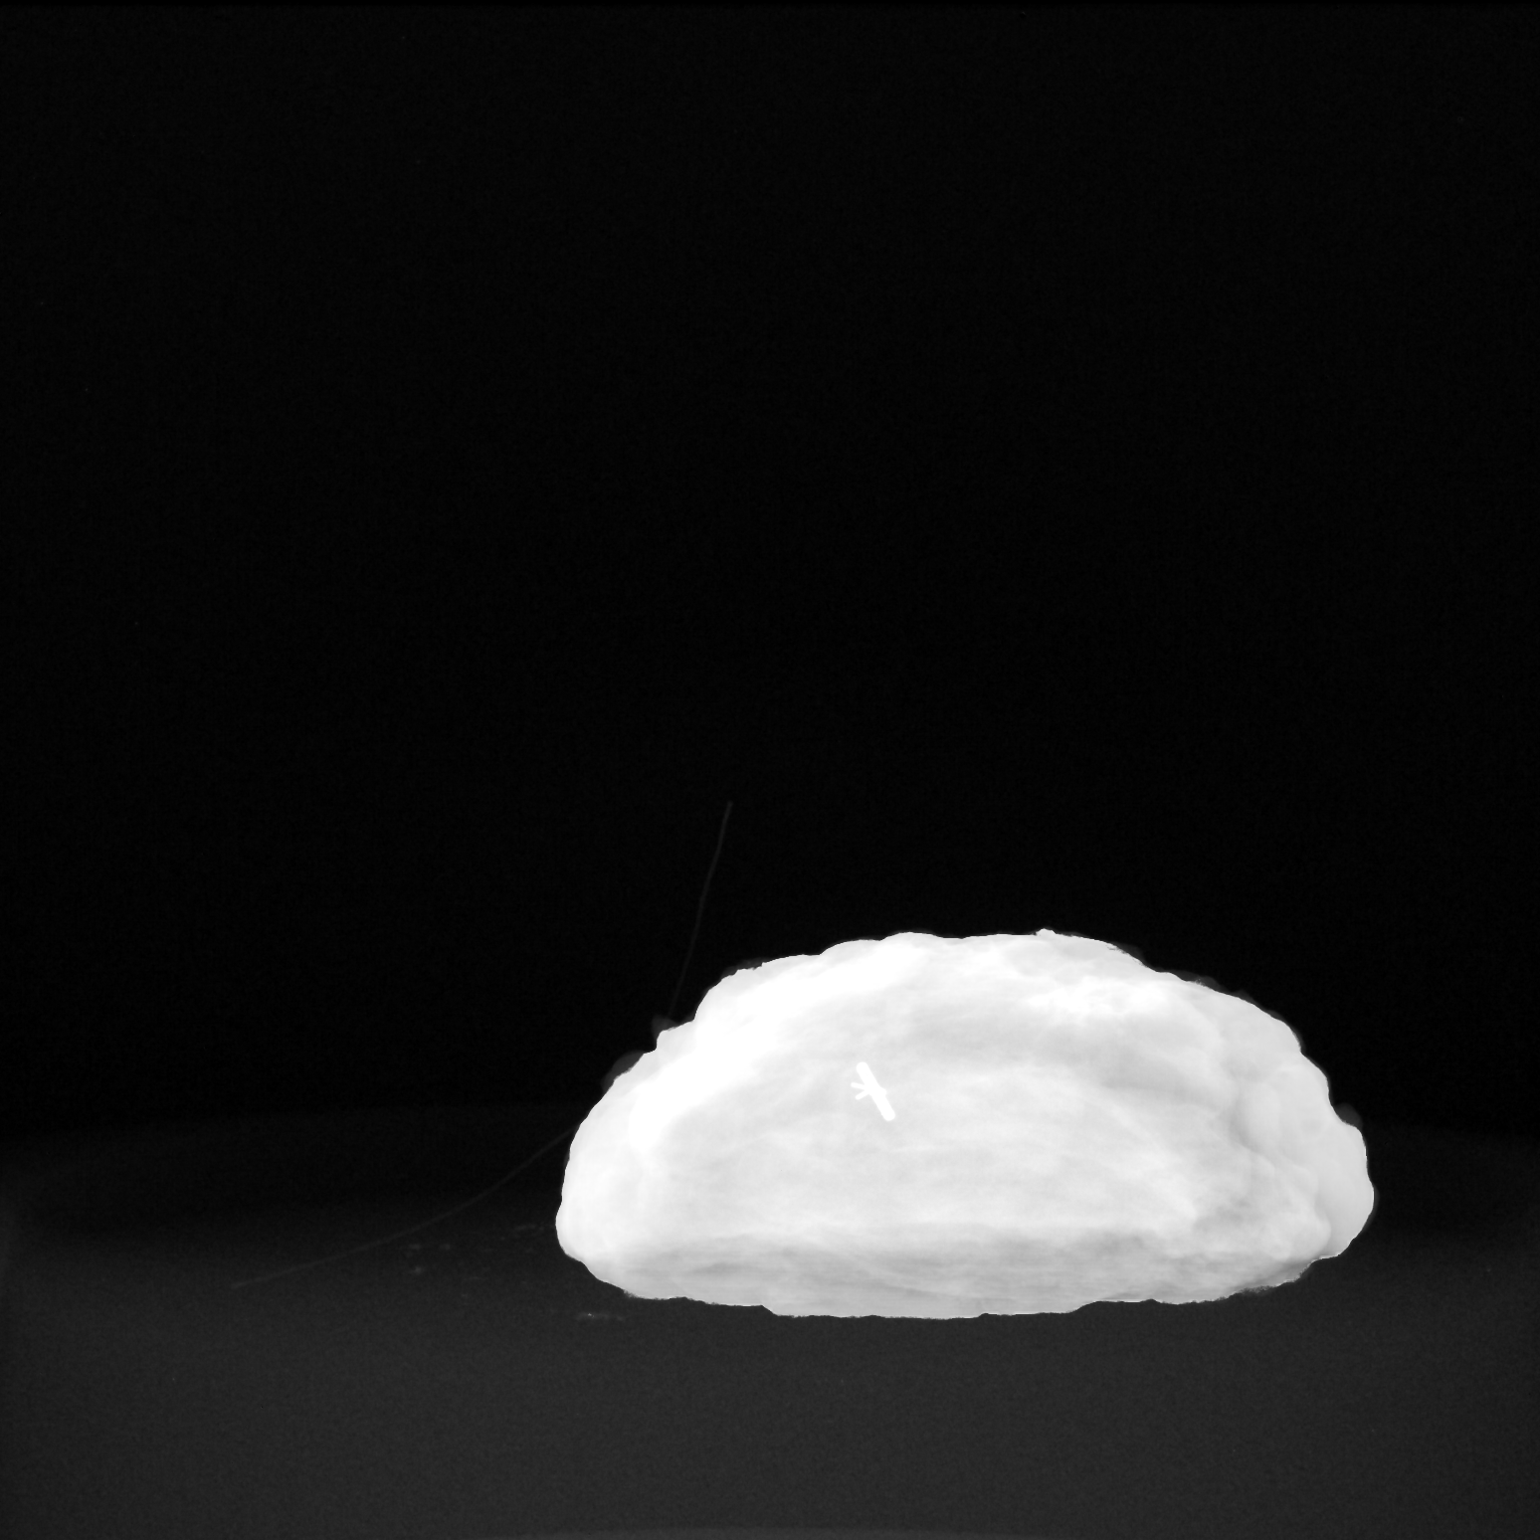

[2 of 2 positions shown; findings below may reference images not displayed]

FINDINGS: Status post excision of the left breast. The radioactive seed and
ribbon shaped biopsy marker clip are present, completely intact, and
were marked for pathology.
IMPRESSION: Specimen radiograph of the LEFT breast.

## 2022-05-22 ENCOUNTER — Other Ambulatory Visit: Payer: Self-pay | Admitting: Nurse Practitioner

## 2022-05-22 ENCOUNTER — Ambulatory Visit
Admission: RE | Admit: 2022-05-22 | Discharge: 2022-05-22 | Disposition: A | Discharge status: Home / Self Care | Payer: Medicare Other | Source: Ambulatory Visit | Attending: Hematology | Admitting: Hematology

## 2022-05-22 ENCOUNTER — Other Ambulatory Visit: Payer: Self-pay | Admitting: Hematology

## 2022-05-22 DIAGNOSIS — Z1231 Encounter for screening mammogram for malignant neoplasm of breast: Secondary | ICD-10-CM

## 2022-05-22 DIAGNOSIS — Z9889 Other specified postprocedural states: Secondary | ICD-10-CM

## 2022-05-25 ENCOUNTER — Other Ambulatory Visit: Payer: Self-pay

## 2022-05-26 ENCOUNTER — Telehealth: Payer: Self-pay

## 2022-05-26 NOTE — Telephone Encounter (Signed)
Spoke with pt via telephone regarding Tamoxifen refill.  Pt stated that she restarted the Tamoxifen after she saw Dr. Burr Medico in clinic in February 2023.  Pt stated that the pain she was having in her hands was related to the Zometa.  Pt stated she's still taking the Tamoxifen.  Refilled the Tamoxifen based off this information.

## 2022-05-27 ENCOUNTER — Inpatient Hospital Stay: Payer: Medicare Other | Attending: Hematology | Admitting: Hematology

## 2022-05-27 ENCOUNTER — Inpatient Hospital Stay: Payer: Medicare Other

## 2022-05-27 ENCOUNTER — Inpatient Hospital Stay: Payer: Medicare Other | Admitting: Hematology

## 2022-05-27 ENCOUNTER — Ambulatory Visit: Payer: Medicare Other

## 2022-05-27 ENCOUNTER — Other Ambulatory Visit: Payer: Self-pay

## 2022-05-27 ENCOUNTER — Encounter: Payer: Self-pay | Admitting: Hematology

## 2022-05-27 VITALS — BP 147/71 | HR 85 | Temp 98.3°F | Wt 136.5 lb

## 2022-05-27 DIAGNOSIS — Z7982 Long term (current) use of aspirin: Secondary | ICD-10-CM | POA: Diagnosis not present

## 2022-05-27 DIAGNOSIS — Z79810 Long term (current) use of selective estrogen receptor modulators (SERMs): Secondary | ICD-10-CM | POA: Insufficient documentation

## 2022-05-27 DIAGNOSIS — C50212 Malignant neoplasm of upper-inner quadrant of left female breast: Secondary | ICD-10-CM

## 2022-05-27 DIAGNOSIS — Z170 Estrogen receptor positive status [ER+]: Secondary | ICD-10-CM

## 2022-05-27 DIAGNOSIS — Z79899 Other long term (current) drug therapy: Secondary | ICD-10-CM | POA: Diagnosis not present

## 2022-05-27 DIAGNOSIS — Z9889 Other specified postprocedural states: Secondary | ICD-10-CM

## 2022-05-27 DIAGNOSIS — Z923 Personal history of irradiation: Secondary | ICD-10-CM | POA: Insufficient documentation

## 2022-05-27 LAB — COMPREHENSIVE METABOLIC PANEL WITH GFR
ALT: 11 U/L (ref 0–44)
AST: 28 U/L (ref 15–41)
Albumin: 3.8 g/dL (ref 3.5–5.0)
Alkaline Phosphatase: 27 U/L — ABNORMAL LOW (ref 38–126)
Anion gap: 8 (ref 5–15)
BUN: 16 mg/dL (ref 8–23)
CO2: 25 mmol/L (ref 22–32)
Calcium: 9.9 mg/dL (ref 8.9–10.3)
Chloride: 104 mmol/L (ref 98–111)
Creatinine, Ser: 0.76 mg/dL (ref 0.44–1.00)
GFR, Estimated: >60 mL/min
Glucose, Bld: 106 mg/dL — ABNORMAL HIGH (ref 70–99)
Potassium: 4.5 mmol/L (ref 3.5–5.1)
Sodium: 137 mmol/L (ref 135–145)
Total Bilirubin: 0.3 mg/dL (ref 0.3–1.2)
Total Protein: 8.5 g/dL — ABNORMAL HIGH (ref 6.5–8.1)

## 2022-05-27 LAB — CBC WITH DIFFERENTIAL/PLATELET
Abs Immature Granulocytes: 0.02 K/uL (ref 0.00–0.07)
Basophils Absolute: 0.1 K/uL (ref 0.0–0.1)
Basophils Relative: 1 %
Eosinophils Absolute: 0.1 K/uL (ref 0.0–0.5)
Eosinophils Relative: 2 %
HCT: 37.7 % (ref 36.0–46.0)
Hemoglobin: 13.2 g/dL (ref 12.0–15.0)
Immature Granulocytes: 0 %
Lymphocytes Relative: 25 %
Lymphs Abs: 1.5 K/uL (ref 0.7–4.0)
MCH: 31.2 pg (ref 26.0–34.0)
MCHC: 35 g/dL (ref 30.0–36.0)
MCV: 89.1 fL (ref 80.0–100.0)
Monocytes Absolute: 0.4 K/uL (ref 0.1–1.0)
Monocytes Relative: 6 %
Neutro Abs: 4 K/uL (ref 1.7–7.7)
Neutrophils Relative %: 66 %
Platelets: 316 K/uL (ref 150–400)
RBC: 4.23 MIL/uL (ref 3.87–5.11)
RDW: 13.5 % (ref 11.5–15.5)
WBC: 6.1 K/uL (ref 4.0–10.5)
nRBC: 0 % (ref 0.0–0.2)

## 2022-05-27 NOTE — Progress Notes (Signed)
[place]   [phone] [phone]   Clinic Follow up Note   Patient Care Team: Asencion Noble, MD as PCP - General (Internal Medicine) Rockwell Germany, RN as Oncology Nurse Navigator Mauro Kaufmann, RN as Oncology Nurse Navigator Alphonsa Overall, MD as Consulting Physician (General Surgery) Kyung Rudd, MD as Consulting Physician (Radiation Oncology) Truitt Merle, MD as Consulting Physician (Hematology) Alla Feeling, NP as Nurse Practitioner (Nurse Practitioner)  Date of Service:  05/27/2022  CHIEF COMPLAINT: f/u of left breast cancer  CURRENT THERAPY:  Tamoxifen 27m daily starting in late 01/2021  ASSESSMENT & PLAN:  Doris ERTLEis a 81y.o. post-hysterectomy female with   1. Left breast cancer, grade 1-2, ER+/PR-/HER2- Ki-67 5%, pT1cN0M0, Grade 1, stage IA, RS 33 -diagnosed in 06/2020. S/p left lumpectomy with SLNB with Dr NLucia Gaskinson 07/05/20, showing: 1.8 cm IDC; 1 negative lymph node; negative margins.  -Oncotype Dx RS of 33, showing high risk. She completed adjuvant Chemo with weekly Taxol 08/09/20 - 10/24/20 and adjuvant radiation 12/06/20 - 01/01/21.   -She began tamoxifen in 01/2021. She is tolerating well. -most recent mammogram from 05/21/21 was benign. She is being scheduled for repeat due to an issue with the MM order. -she is clinically doing well, and her hand/bone pains have resolved. Lab reviewed, WNL. Physical exam was unremarkable. There is no clinical concern for recurrence. -lab and f/u in 6 months   2. Osteopenia  -most recent DEXA on 01/16/21 shows osteopenia with -1.4 at left hip with higher risk of hip fracture. -she is taking calcium and vit D -Zometa discontinued after one dose 11/24/21 due to bilateral bone pain and weakness, she recovered well  -she knows tamoxifen can help strengthen her bone.     PLAN: -Continue tamoxifen -lab and f/u with NP [name] in 6 months   No problem-specific Assessment & Plan notes found for this  encounter.   SUMMARY OF ONCOLOGIC HISTORY: Oncology History Overview Note  Cancer Staging Malignant neoplasm of upper-inner quadrant of female breast ([place] Staging form: Breast, AJCC 8th Edition - Clinical stage from 06/11/2020: Stage IA (cT1b, cN0, cM0, G2, ER+, PR-, HER2: Equivocal) - Signed by BAlla Feeling NP on 07/01/2020 - Pathologic stage from 07/05/2020: Stage IA (pT1c, pN0, cM0, G2, ER+, PR-, HER2-, Oncotype DX score: 33) - Signed by FTruitt Merle MD on 07/29/2020    Malignant neoplasm of upper-inner quadrant of female breast ([facility]  05/28/2020 Mammogram   Diagnostic left mammogram/US  -On physical exam, I palpate discrete focal soft thickening in the 10-11 o'clock location of the LEFT breast. -Targeted ultrasound is performed, showing an irregular taller than wide hypoechoic mass with indistinct margins in the 10 o'clock location of the LEFT breast 8 centimeters from the nipple. There is associated posterior acoustic shadowing. No significant internal blood flow identified on Doppler evaluation.  IMPRESSION: Suspicious mass in the 10 o'clock location of the LEFT breast. No LEFT axillary adenopathy.     06/11/2020 Cancer Staging   Staging form: Breast, AJCC 8th Edition - Clinical stage from 06/11/2020: Stage IA (cT1b, cN0, cM0, G2, ER+, PR-, HER2: Equivocal) - Signed by BAlla Feeling NP on 07/01/2020   06/11/2020 Initial Biopsy   FINAL MICROSCOPIC DIAGNOSIS: A. BREAST, 10:00, LEFT, BIOPSY: - Invasive ductal carcinoma The carcinoma appears Nottingham grade 1-2 of 3 and measures 0.9 cm in greatest linear extent  PROGNOSTIC INDICATOR RESULTS: The tumor cells are EQUIVOCAL for Her2 (2+) Estrogen Receptor: 90%, MODERATE STAINING  INTENSITY Progesterone Receptor: NEGATIVE Proliferation Marker Ki-67: 5%  FLOURESCENCE IN-SITU HYBRIDIZATION RESULTS: GROUP 5: HER2 **NEGATIVE** RATIO of HER2/CEN 17 SIGNALS: 1.18 AVERAGE HER2 COPY NUMBER PER CELL: 1.30   07/01/2020 Initial  Diagnosis   Malignant neoplasm of upper-inner quadrant of female breast ([place])   07/05/2020 Surgery   LEFT BREAST LUMPECTOMY WITH RADIOACTIVE SEED AND LEFT AXILLARY SENTINEL LYMPH NODE BIOPSY by Dr Lucia Gaskins    07/05/2020 Pathology Results   FINAL MICROSCOPIC DIAGNOSIS:   A. BREAST, LEFT, LUMPECTOMY:  - Invasive ductal carcinoma, 1.8 cm.  - Margins not involved.  - Biopsy site and biopsy clip.  - See oncology table.   B. LYMPH NODE, LEFT AXILLARY, SENTINEL, EXCISION:  - One lymph node with no metastatic carcinoma (0/1).    07/05/2020 Oncotype testing   Oncotype  Recurrence Score 33 Distant recurrence risk at 9 years with Tamoxifen alone is 21% There is >15% benefit of adjuvant chemotherapy.    07/05/2020 Cancer Staging   Staging form: Breast, AJCC 8th Edition - Pathologic stage from 07/05/2020: Stage IA (pT1c, pN0, cM0, G2, ER+, PR-, HER2-, Oncotype DX score: 33) - Signed by Truitt Merle, MD on 07/29/2020   08/09/2020 - 10/24/2020 Chemotherapy   Weekly Taxol for 12 weeks starting 08/09/20-10/24/20   12/06/2020 - 01/01/2021 Radiation Therapy   Adjuvant Radiation with Dr Lisbeth Renshaw    01/2021 -  Anti-estrogen oral therapy   Tamoxifen 79m daily starting in late 01/2021   04/28/2021 Survivorship   SCP delivered by LCira Rue NP       INTERVAL HISTORY:  MMarice Ellis here for a follow up of breast cancer. She was last seen by me on 12/09/21. She presents to the clinic alone. She reports her hair has grown back curly. She reports she has not had any problems since restarting tamoxifen. She explains the hand and bone pains were very likely from Zometa.  She reports she still has some neuropathy in her feet, "like they're asleep."   All other systems were reviewed with the patient and are negative.  MEDICAL HISTORY:  Past Medical History:  Diagnosis Date   Anxiety    Arthritis    [place]cer [place] 05[place]   left breast IDC   Concussion 1985   Depression    Hyperlipidemia    Hypertension    Mild  concussion 2002   tripped over bag at work   Osteopenia 07/2011   T score -1.8 FRAX 11%/1.5%   Panic attacks    Personal history of chemotherapy    Personal history of radiation therapy    Seasonal allergies     SURGICAL HISTORY: Past Surgical History:  Procedure Laterality Date   ABDOMINAL HYSTERECTOMY  01/1989   TAH BSO   BACK SURGERY  1996   ruputured disc L-5   BLADDER SUSPENSION  1982   BREAST BIOPSY Left    BREAST LUMPECTOMY WITH RADIOACTIVE SEED AND SENTINEL LYMPH NODE BIOPSY Left 07/05/2020   Procedure: LEFT BREAST LUMPECTOMY WITH RADIOACTIVE SEED AND LEFT AXILLARY SENTINEL LYMPH NODE BIOPSY;  Surgeon: NAlphonsa Overall MD;  [facility]  Service: General;  Laterality: Left;  LEFT BREAST CANCER   BREAST SURGERY  1984   cyst-benign   COLONOSCOPY N/A 03/30/2013   Procedure: COLONOSCOPY;  Surgeon: NRogene Houston MD;  [facility];  Service: Endoscopy;  Laterality: N/A;  930   COLONOSCOPY WITH PROPOFOL N/A 03/04/2022   Procedure: COLONOSCOPY WITH PROPOFOL;  Surgeon: RRogene Houston MD;  [facility];  Service: Endoscopy;  Laterality: N/A;  [place]  12/09/1988   abnormal uterine bleeding   IR IMAGING GUIDED PORT INSERTION  08/05/2020   POLYPECTOMY  03/04/2022   Procedure: POLYPECTOMY;  Surgeon: Rogene Houston, MD;  [facility];  Service: Endoscopy;;  cecal x2; sigmoid colon;   PORT-A-CATH REMOVAL Right 04/17/2021   Procedure: REMOVAL PORT-A-CATH;  Surgeon: Erroll Luna, MD;  Location: [facility];  Service: General;  Laterality: Right;   TONSILLECTOMY      I have reviewed the social history and family history with the patient and they are unchanged from previous note.  ALLERGIES:  is allergic to lipitor [atorvastatin calcium], zocor [simvastatin - high dose], and benadryl [diphenhydramine].  MEDICATIONS:  Current Outpatient Medications  Medication Sig Dispense Refill    ALPRAZolam (XANAX) 0.5 MG tablet Take 0.5 mg by mouth See admin instructions. Take 0.5 mg at bedtime, may take a second 0.5 mg dose later in the night as needed for sleep     amLODipine (NORVASC) 2.5 MG tablet Take 2.5 mg by mouth daily.     aspirin EC 81 MG tablet Take 1 tablet (81 mg total) by mouth daily. Swallow whole. 30 tablet 11   Calcium Carbonate-Vit D-Min (QC CALCIUM-MAGNESIUM-ZINC-D3 PO) Take 1 tablet by mouth daily.     Carboxymethylcellul-Glycerin (LUBRICATING EYE DROPS OP) Place 1 drop into both eyes daily as needed (dry eyes).     cyclobenzaprine (FLEXERIL) 10 MG tablet TAKE 1 TABLET(10 MG) BY MOUTH THREE TIMES DAILY AS NEEDED FOR MUSCLE SPASMS 30 tablet 0   docusate sodium (COLACE) 100 MG capsule Take 100 mg by mouth daily as needed for mild constipation.     ibuprofen (ADVIL) 800 MG tablet Take 800 mg by mouth every 8 (eight) hours as needed for pain.     Multiple Vitamin (MULTIVITAMIN WITH MINERALS) TABS tablet Take 1 tablet by mouth daily.     pravastatin (PRAVACHOL) 10 MG tablet Take 10 mg by mouth daily.     pyridOXINE (VITAMIN B-6) 100 MG tablet Take 100 mg by mouth daily.     sertraline (ZOLOFT) 50 MG tablet Take 50 mg by mouth daily.     Sodium Chloride-Sodium Bicarb (AYR SALINE NASAL RINSE NA) Place 1 Dose into the nose daily.     tamoxifen (NOLVADEX) 20 MG tablet TAKE 1 TABLET(20 MG) BY MOUTH DAILY 30 tablet 3   No current facility-administered medications for this visit.    PHYSICAL EXAMINATION: ECOG PERFORMANCE STATUS: 0 - Asymptomatic  Vitals:   05/27/22 1457  BP: (!) 147/71  Pulse: 85  Temp: 98.3 F (36.8 C)  SpO2: 96%   Wt Readings from Last 3 Encounters:  05/27/22 136 lb 8 oz (61.9 kg)  03/04/22 125 lb (56.7 kg)  03/02/22 125 lb (56.7 kg)     GENERAL:alert, no distress and comfortable SKIN: skin color, texture, turgor are normal, no rashes or significant lesions EYES: normal, Conjunctiva are pink and non-injected, sclera clear  NECK: supple,  thyroid normal size, non-tender, without nodularity LYMPH:  no palpable lymphadenopathy in the cervical, axillary LUNGS: clear to auscultation and percussion with normal breathing effort HEART: regular rate & rhythm and no murmurs and no lower extremity edema ABDOMEN:abdomen soft, non-tender and normal bowel sounds Musculoskeletal:no cyanosis of digits and no clubbing  NEURO: alert & oriented x 3 with fluent speech, no focal motor/sensory deficits BREAST: No palpable mass, nodules or adenopathy bilaterally. Breast exam  benign.   LABORATORY DATA:  I have reviewed the data as listed    Latest Ref Rng & Units 05/27/2022    2:40 PM 12/09/2021    8:48 AM 11/24/2021   10:40 AM  CBC  WBC 4.0 - 10.5 K/uL 6.1  6.2  6.9   Hemoglobin 12.0 - 15.0 g/dL 13.2  13.5  13.1   Hematocrit 36.0 - 46.0 % 37.7  40.4  37.5   Platelets 150 - 400 K/uL 316  322  287         Latest Ref Rng & Units 05/27/2022    2:40 PM 12/09/2021    8:48 AM 11/24/2021   10:40 AM  CMP  Glucose 70 - 99 mg/dL 106  114  129   BUN 8 - 23 mg/dL _0 Creatinine 0.44 - 1.00 mg/dL 0.76  0.79  0.68   Sodium 135 - 145 mmol/L 137  135  133   Potassium 3.5 - 5.1 mmol/L 4.5  4.1  3.9   Chloride 98 - 111 mmol/L 104  100  103   CO2 22 - 32 mmol/L _1 Calcium 8.9 - 10.3 mg/dL 9.9  9.9  9.2   Total Protein 6.5 - 8.1 g/dL 8.5  8.0  8.2   Total Bilirubin 0.3 - 1.2 mg/dL 0.3  0.4  0.7   Alkaline Phos 38 - 126 U/L _2 AST 15 - 41 U/L _3 ALT 0 - 44 U/L _4 RADIOGRAPHIC STUDIES: I have personally reviewed the radiological images as listed and agreed with the findings in the report. No results found.    No orders of the defined types were placed in this encounter.  All questions were answered. The patient knows to call the clinic with any problems, questions or concerns. No barriers to learning was detected. The total time spent in the appointment was 30 minutes.     Truitt Merle,  MD 05/27/2022   I, Wilburn Mylar, am acting as scribe for Truitt Merle, MD.   I have reviewed the above documentation for accuracy and completeness, and I agree with the above.

## 2022-05-29 ENCOUNTER — Encounter: Payer: Self-pay | Admitting: Hematology

## 2022-06-02 ENCOUNTER — Other Ambulatory Visit: Payer: Self-pay | Admitting: Nurse Practitioner

## 2022-06-02 DIAGNOSIS — Z85828 Personal history of other malignant neoplasm of skin: Secondary | ICD-10-CM | POA: Diagnosis not present

## 2022-06-02 DIAGNOSIS — L570 Actinic keratosis: Secondary | ICD-10-CM | POA: Diagnosis not present

## 2022-06-02 DIAGNOSIS — Z1283 Encounter for screening for malignant neoplasm of skin: Secondary | ICD-10-CM | POA: Diagnosis not present

## 2022-06-02 DIAGNOSIS — Z9889 Other specified postprocedural states: Secondary | ICD-10-CM

## 2022-06-02 DIAGNOSIS — X32XXXA Exposure to sunlight, initial encounter: Secondary | ICD-10-CM | POA: Diagnosis not present

## 2022-06-02 DIAGNOSIS — L0100 Impetigo, unspecified: Secondary | ICD-10-CM | POA: Diagnosis not present

## 2022-06-02 DIAGNOSIS — Z08 Encounter for follow-up examination after completed treatment for malignant neoplasm: Secondary | ICD-10-CM | POA: Diagnosis not present

## 2022-06-09 ENCOUNTER — Ambulatory Visit
Admission: RE | Admit: 2022-06-09 | Discharge: 2022-06-09 | Disposition: A | Discharge status: Home / Self Care | Payer: Medicare Other | Source: Ambulatory Visit | Attending: Nurse Practitioner | Admitting: Nurse Practitioner

## 2022-06-09 DIAGNOSIS — Z853 Personal history of malignant neoplasm of breast: Secondary | ICD-10-CM | POA: Diagnosis not present

## 2022-06-09 DIAGNOSIS — Z9889 Other specified postprocedural states: Secondary | ICD-10-CM

## 2022-08-22 ENCOUNTER — Other Ambulatory Visit: Payer: Self-pay | Admitting: Hematology

## 2022-08-28 DIAGNOSIS — I10 Essential (primary) hypertension: Secondary | ICD-10-CM | POA: Diagnosis not present

## 2022-09-14 DIAGNOSIS — Z961 Presence of intraocular lens: Secondary | ICD-10-CM | POA: Diagnosis not present

## 2022-09-14 DIAGNOSIS — H524 Presbyopia: Secondary | ICD-10-CM | POA: Diagnosis not present

## 2022-09-14 DIAGNOSIS — H353131 Nonexudative age-related macular degeneration, bilateral, early dry stage: Secondary | ICD-10-CM | POA: Diagnosis not present

## 2022-10-01 DIAGNOSIS — H353132 Nonexudative age-related macular degeneration, bilateral, intermediate dry stage: Secondary | ICD-10-CM | POA: Diagnosis not present

## 2022-10-01 DIAGNOSIS — H43813 Vitreous degeneration, bilateral: Secondary | ICD-10-CM | POA: Diagnosis not present

## 2022-10-14 ENCOUNTER — Other Ambulatory Visit: Payer: Self-pay

## 2022-10-14 MED ORDER — TAMOXIFEN CITRATE 20 MG PO TABS
ORAL_TABLET | ORAL | 3 refills | Status: DC
Start: 2022-10-14 — End: 2023-05-14

## 2022-11-03 ENCOUNTER — Telehealth: Payer: Self-pay | Admitting: Nurse Practitioner

## 2022-11-03 NOTE — Telephone Encounter (Signed)
Left patient vm regarding appointment changes

## 2022-11-05 ENCOUNTER — Telehealth: Payer: Self-pay

## 2022-11-05 DIAGNOSIS — Z170 Estrogen receptor positive status [ER+]: Secondary | ICD-10-CM

## 2022-11-05 NOTE — Telephone Encounter (Signed)
S1714 - A Prospective Observational Cohort Study to Develop a Predictive Model of Taxane-Induced Peripheral Neuropathy in Cancer Patients   Called the patient for a follow call for the s1714 study.  Will follow up with the patient on 30 Nov 2022,in person, to complete other study procedures.   Johny Drilling, [facility] 11/05/2022 3:45 PM

## 2022-11-30 ENCOUNTER — Other Ambulatory Visit: Payer: Self-pay | Admitting: Nurse Practitioner

## 2022-11-30 ENCOUNTER — Inpatient Hospital Stay: Payer: Medicare Other | Admitting: Nurse Practitioner

## 2022-11-30 ENCOUNTER — Other Ambulatory Visit: Payer: Medicare Other

## 2022-11-30 ENCOUNTER — Ambulatory Visit: Payer: Medicare Other | Admitting: Nurse Practitioner

## 2022-11-30 ENCOUNTER — Encounter: Payer: Self-pay | Admitting: Hematology

## 2022-11-30 ENCOUNTER — Encounter: Payer: Self-pay | Admitting: Nurse Practitioner

## 2022-11-30 ENCOUNTER — Other Ambulatory Visit: Payer: Self-pay

## 2022-11-30 ENCOUNTER — Inpatient Hospital Stay: Payer: Medicare Other | Attending: Nurse Practitioner

## 2022-11-30 VITALS — BP 149/64 | HR 65 | Temp 97.8°F | Resp 16 | Wt 139.7 lb

## 2022-11-30 DIAGNOSIS — Z923 Personal history of irradiation: Secondary | ICD-10-CM | POA: Diagnosis not present

## 2022-11-30 DIAGNOSIS — M85852 Other specified disorders of bone density and structure, left thigh: Secondary | ICD-10-CM | POA: Insufficient documentation

## 2022-11-30 DIAGNOSIS — Z90710 Acquired absence of both cervix and uterus: Secondary | ICD-10-CM | POA: Insufficient documentation

## 2022-11-30 DIAGNOSIS — Z79899 Other long term (current) drug therapy: Secondary | ICD-10-CM | POA: Diagnosis not present

## 2022-11-30 DIAGNOSIS — E2839 Other primary ovarian failure: Secondary | ICD-10-CM | POA: Diagnosis not present

## 2022-11-30 DIAGNOSIS — Z170 Estrogen receptor positive status [ER+]: Secondary | ICD-10-CM | POA: Insufficient documentation

## 2022-11-30 DIAGNOSIS — Z7982 Long term (current) use of aspirin: Secondary | ICD-10-CM | POA: Diagnosis not present

## 2022-11-30 DIAGNOSIS — Z79810 Long term (current) use of selective estrogen receptor modulators (SERMs): Secondary | ICD-10-CM | POA: Diagnosis not present

## 2022-11-30 DIAGNOSIS — Z90722 Acquired absence of ovaries, bilateral: Secondary | ICD-10-CM | POA: Insufficient documentation

## 2022-11-30 DIAGNOSIS — I10 Essential (primary) hypertension: Secondary | ICD-10-CM | POA: Diagnosis not present

## 2022-11-30 DIAGNOSIS — C50212 Malignant neoplasm of upper-inner quadrant of left female breast: Secondary | ICD-10-CM | POA: Diagnosis not present

## 2022-11-30 LAB — CBC WITH DIFFERENTIAL (CANCER CENTER ONLY)
Abs Immature Granulocytes: 0.02 10*3/uL (ref 0.00–0.07)
Basophils Absolute: 0.1 10*3/uL (ref 0.0–0.1)
Basophils Relative: 1 %
Eosinophils Absolute: 0.2 10*3/uL (ref 0.0–0.5)
Eosinophils Relative: 3 %
HCT: 37.2 % (ref 36.0–46.0)
Hemoglobin: 12.9 g/dL (ref 12.0–15.0)
Immature Granulocytes: 0 %
Lymphocytes Relative: 22 %
Lymphs Abs: 1.6 10*3/uL (ref 0.7–4.0)
MCH: 31.2 pg (ref 26.0–34.0)
MCHC: 34.7 g/dL (ref 30.0–36.0)
MCV: 89.9 fL (ref 80.0–100.0)
Monocytes Absolute: 0.6 10*3/uL (ref 0.1–1.0)
Monocytes Relative: 8 %
Neutro Abs: 4.8 10*3/uL (ref 1.7–7.7)
Neutrophils Relative %: 66 %
Platelet Count: 285 10*3/uL (ref 150–400)
RBC: 4.14 MIL/uL (ref 3.87–5.11)
RDW: 13.5 % (ref 11.5–15.5)
WBC Count: 7.2 10*3/uL (ref 4.0–10.5)
nRBC: 0 % (ref 0.0–0.2)

## 2022-11-30 LAB — CMP (CANCER CENTER ONLY)
ALT: 6 U/L (ref 0–44)
AST: 21 U/L (ref 15–41)
Albumin: 3.8 g/dL (ref 3.5–5.0)
Alkaline Phosphatase: 28 U/L — ABNORMAL LOW (ref 38–126)
Anion gap: 9 (ref 5–15)
BUN: 12 mg/dL (ref 8–23)
CO2: 23 mmol/L (ref 22–32)
Calcium: 9.6 mg/dL (ref 8.9–10.3)
Chloride: 101 mmol/L (ref 98–111)
Creatinine: 0.74 mg/dL (ref 0.44–1.00)
GFR, Estimated: >60 mL/min (ref >60)
Glucose, Bld: 107 mg/dL — ABNORMAL HIGH (ref 70–99)
Potassium: 4.2 mmol/L (ref 3.5–5.1)
Sodium: 133 mmol/L — ABNORMAL LOW (ref 135–145)
Total Bilirubin: 0.4 mg/dL (ref 0.3–1.2)
Total Protein: 8.3 g/dL — ABNORMAL HIGH (ref 6.5–8.1)

## 2022-11-30 NOTE — Research (Signed)
S1714 - A Prospective Observational Cohort Study to Develop a Predictive Model of Taxane-Induced Peripheral Neuropathy in Cancer Patients   104 Weeks: Met with the patient in the clinic to complete study procedures for the s1714 study.  PROs: Completed EORTC QLQ-CIPN20, PRO-CTCAE, and FACT/GOG-NTX-4  Thanked the patient for their time and reminded them about their 156 week follow up call next year.   Johny Drilling, [facility] 11/30/2022 1:07 PM

## 2022-11-30 NOTE — Progress Notes (Signed)
Patient Care Team: Asencion Noble, MD as PCP - General (Internal Medicine) Rockwell Germany, RN as Oncology Nurse Navigator Mauro Kaufmann, RN as Oncology Nurse Navigator Alphonsa Overall, MD as Consulting Physician (General Surgery) Kyung Rudd, MD as Consulting Physician (Radiation Oncology) Truitt Merle, MD as Consulting Physician (Hematology) Alla Feeling, NP as Nurse Practitioner (Nurse Practitioner)   CHIEF COMPLAINT: Follow-up left breast cancer  Oncology History Overview Note  Cancer Staging Malignant neoplasm of upper-inner quadrant of female breast [facility]) Staging form: Breast, AJCC 8th Edition - Clinical stage from 06/11/2020: Stage IA (cT1b, cN0, cM0, G2, ER+, PR-, HER2: Equivocal) - Signed by Alla Feeling, NP on 07/01/2020 - Pathologic stage from 07/05/2020: Stage IA (pT1c, pN0, cM0, G2, ER+, PR-, HER2-, Oncotype DX score: 33) - Signed by Truitt Merle, MD on 07/29/2020    Malignant neoplasm of upper-inner quadrant of female breast ([place])  05/28/2020 Mammogram   Diagnostic left mammogram/US  -On physical exam, I palpate discrete focal soft thickening in the 10-11 o'clock location of the LEFT breast. -Targeted ultrasound is performed, showing an irregular taller than wide hypoechoic mass with indistinct margins in the 10 o'clock location of the LEFT breast 8 centimeters from the nipple. There is associated posterior acoustic shadowing. No significant internal blood flow identified on Doppler evaluation.  IMPRESSION: Suspicious mass in the 10 o'clock location of the LEFT breast. No LEFT axillary adenopathy.     06/11/2020 Cancer Staging   Staging form: Breast, AJCC 8th Edition - Clinical stage from 06/11/2020: Stage IA (cT1b, cN0, cM0, G2, ER+, PR-, HER2: Equivocal) - Signed by Alla Feeling, NP on 07/01/2020   06/11/2020 Initial Biopsy   FINAL MICROSCOPIC DIAGNOSIS: A. BREAST, 10:00, LEFT, BIOPSY: - Invasive ductal carcinoma The carcinoma appears Nottingham grade 1-2 of 3  and measures 0.9 cm in greatest linear extent  PROGNOSTIC INDICATOR RESULTS: The tumor cells are EQUIVOCAL for Her2 (2+) Estrogen Receptor: 90%, MODERATE STAINING INTENSITY Progesterone Receptor: NEGATIVE Proliferation Marker Ki-67: 5%  FLOURESCENCE IN-SITU HYBRIDIZATION RESULTS: GROUP 5: HER2 **NEGATIVE** RATIO of HER2/CEN 17 SIGNALS: 1.18 AVERAGE HER2 COPY NUMBER PER CELL: 1.30   07/01/2020 Initial Diagnosis   Malignant neoplasm of upper-inner quadrant of female breast ([place])   07/05/2020 Surgery   LEFT BREAST LUMPECTOMY WITH RADIOACTIVE SEED AND LEFT AXILLARY SENTINEL LYMPH NODE BIOPSY by Dr Lucia Gaskins    07/05/2020 Pathology Results   FINAL MICROSCOPIC DIAGNOSIS:   A. BREAST, LEFT, LUMPECTOMY:  - Invasive ductal carcinoma, 1.8 cm.  - Margins not involved.  - Biopsy site and biopsy clip.  - See oncology table.   B. LYMPH NODE, LEFT AXILLARY, SENTINEL, EXCISION:  - One lymph node with no metastatic carcinoma (0/1).    07/05/2020 Oncotype testing   Oncotype  Recurrence Score 33 Distant recurrence risk at 9 years with Tamoxifen alone is 21% There is >15% benefit of adjuvant chemotherapy.    07/05/2020 Cancer Staging   Staging form: Breast, AJCC 8th Edition - Pathologic stage from 07/05/2020: Stage IA (pT1c, pN0, cM0, G2, ER+, PR-, HER2-, Oncotype DX score: 33) - Signed by Truitt Merle, MD on 07/29/2020   08/09/2020 - 10/24/2020 Chemotherapy   Weekly Taxol for 12 weeks starting 08/09/20-10/24/20   12/06/2020 - 01/01/2021 Radiation Therapy   Adjuvant Radiation with Dr Lisbeth Renshaw    01/2021 -  Anti-estrogen oral therapy   Tamoxifen '20mg'$  daily starting in late 01/2021   04/28/2021 Survivorship   SCP delivered by Cira Rue, NP       CURRENT  THERAPY: Tamoxifen 20 mg daily starting late 01/2021  INTERVAL HISTORY Doris [facility] returns for follow-up as scheduled, last seen by Dr. Burr Medico 05/27/2022.  Mammogram in August was negative.  Energy has returned and she feels more like herself, able to do more. She  has mild residual neuropathy in her toes, not bothersome.  She scrubs her feet with a sandpaper type rough surface in the shower which helps. She continues tamoxifen, tolerating well without significant side effects.  Denies breast concerns such as new lump/mass, nipple discharge or inversion, or skin change.  ROS  All other systems reviewed and negative  Past Medical History:  Diagnosis Date   Anxiety    Arthritis    Cancer ([place]) 05/2020   left breast IDC   Concussion 1985   Depression    Hyperlipidemia    Hypertension    Mild concussion 2002   tripped over bag at work   Osteopenia 07/2011   T score -1.8 FRAX 11%/1.5%   Panic attacks    Personal history of chemotherapy    Personal history of radiation therapy    Seasonal allergies      Past Surgical History:  Procedure Laterality Date   ABDOMINAL HYSTERECTOMY  01/1989   TAH BSO   BACK SURGERY  1996   ruputured disc L-5   BLADDER SUSPENSION  1982   BREAST BIOPSY Left    BREAST LUMPECTOMY Left 07/05/2020   BREAST LUMPECTOMY WITH RADIOACTIVE SEED AND SENTINEL LYMPH NODE BIOPSY Left 07/05/2020   Procedure: LEFT BREAST LUMPECTOMY WITH RADIOACTIVE SEED AND LEFT AXILLARY SENTINEL LYMPH NODE BIOPSY;  Surgeon: Alphonsa Overall, MD;  Location: [place];  Service: General;  Laterality: Left;  LEFT BREAST CANCER   BREAST SURGERY  1984   cyst-benign   COLONOSCOPY N/A 03/30/2013   Procedure: COLONOSCOPY;  Surgeon: Rogene Houston, MD;  [facility];  Service: Endoscopy;  Laterality: N/A;  930   COLONOSCOPY WITH PROPOFOL N/A 03/04/2022   Procedure: COLONOSCOPY WITH PROPOFOL;  Surgeon: Rogene Houston, MD;  [facility];  Service: Endoscopy;  Laterality: N/A;  [place]  12/09/1988   abnormal uterine bleeding   IR IMAGING GUIDED PORT INSERTION  08/05/2020   POLYPECTOMY  03/04/2022   Procedure: POLYPECTOMY;  Surgeon: Rogene Houston, MD;  [facility];   Service: Endoscopy;;  cecal x2; sigmoid colon;   PORT-A-CATH REMOVAL Right 04/17/2021   Procedure: REMOVAL PORT-A-CATH;  Surgeon: Erroll Luna, MD;  Location: [place];  Service: General;  Laterality: Right;   TONSILLECTOMY       Outpatient Encounter Medications as of 11/30/2022  Medication Sig   ALPRAZolam (XANAX) 0.5 MG tablet Take 0.5 mg by mouth See admin instructions. Take 0.5 mg at bedtime, may take a second 0.5 mg dose later in the night as needed for sleep   amLODipine (NORVASC) 2.5 MG tablet Take 5 mg by mouth daily.   aspirin EC 81 MG tablet Take 1 tablet (81 mg total) by mouth daily. Swallow whole.   Calcium Carbonate-Vit D-Min (QC CALCIUM-MAGNESIUM-ZINC-D3 PO) Take 1 tablet by mouth daily.   Carboxymethylcellul-Glycerin (LUBRICATING EYE DROPS OP) Place 1 drop into both eyes daily as needed (dry eyes).   cyclobenzaprine (FLEXERIL) 10 MG tablet TAKE 1 TABLET(10 MG) BY MOUTH THREE TIMES DAILY AS NEEDED FOR MUSCLE SPASMS   docusate sodium (COLACE) 100 MG capsule Take 100 mg by mouth daily as needed for mild constipation.  ibuprofen (ADVIL) 800 MG tablet Take 800 mg by mouth every 8 (eight) hours as needed for pain.   Multiple Vitamin (MULTIVITAMIN WITH MINERALS) TABS tablet Take 1 tablet by mouth daily.   pravastatin (PRAVACHOL) 10 MG tablet Take 10 mg by mouth daily.   pyridOXINE (VITAMIN B-6) 100 MG tablet Take 100 mg by mouth daily.   sertraline (ZOLOFT) 50 MG tablet Take 50 mg by mouth daily.   Sodium Chloride-Sodium Bicarb (AYR SALINE NASAL RINSE NA) Place 1 Dose into the nose daily.   tamoxifen (NOLVADEX) 20 MG tablet TAKE 1 TABLET(20 MG) BY MOUTH DAILY   No facility-administered encounter medications on file as of 11/30/2022.     Today's Vitals   11/30/22 1137  BP: (!) 149/64  Pulse: 65  Resp: 16  Temp: 97.8 F (36.6 C)  TempSrc: Oral  SpO2: 98%  Weight: 139 lb 11.2 oz (63.4 kg)   Body mass index is 23.98 kg/m.   PHYSICAL EXAM GENERAL:alert,  no distress and comfortable SKIN: no rash  EYES: sclera clear NECK: without mass LYMPH:  no palpable cervical or supraclavicular lymphadenopathy  LUNGS:  normal breathing effort HEART:  no lower extremity edema ABDOMEN: abdomen soft, non-tender and normal bowel sounds NEURO: alert & oriented x 3 with fluent speech, no focal motor/sensory deficits.  Intact peripheral vibratory sense over the feet per tuning fork exam Breast exam: Symmetrical without nipple discharge or inversion.  S/p left lumpectomy, incisions completely healed.  No palpable mass or nodularity in either breast or axilla that I could appreciate.   CBC    Component Value Date/Time   WBC 7.2 11/30/2022 1120   WBC 6.1 05/27/2022 1440   RBC 4.14 11/30/2022 1120   HGB 12.9 11/30/2022 1120   HCT 37.2 11/30/2022 1120   PLT 285 11/30/2022 1120   MCV 89.9 11/30/2022 1120   MCH 31.2 11/30/2022 1120   MCHC 34.7 11/30/2022 1120   RDW 13.5 11/30/2022 1120   LYMPHSABS 1.6 11/30/2022 1120   MONOABS 0.6 11/30/2022 1120   EOSABS 0.2 11/30/2022 1120   BASOSABS 0.1 11/30/2022 1120     CMP     Component Value Date/Time   NA 133 (L) 11/30/2022 1120   K 4.2 11/30/2022 1120   CL 101 11/30/2022 1120   CO2 23 11/30/2022 1120   GLUCOSE 107 (H) 11/30/2022 1120   BUN 12 11/30/2022 1120   CREATININE 0.74 11/30/2022 1120   CALCIUM 9.6 11/30/2022 1120   PROT 8.3 (H) 11/30/2022 1120   ALBUMIN 3.8 11/30/2022 1120   AST 21 11/30/2022 1120   ALT 6 11/30/2022 1120   ALKPHOS 28 (L) 11/30/2022 1120   BILITOT 0.4 11/30/2022 1120   GFRNONAA >60 11/30/2022 1120   GFRAA >60 08/05/2020 1200     ASSESSMENT & PLAN:Doris Ellis is a 82 yo post-hysterectomy female with    1. Left breast cancer, grade 1-2, ER+/PR-/HER2- Ki-67 5%, pT1cN0M0, Grade 1, stage IA, RS 33 -diagnosed in 06/2020. S/p left lumpectomy with SLNB Lucia Gaskins). Oncotype Dx RS of 33, showing high risk. She completed adjuvant Chemo with weekly Taxol 08/09/20 - 10/24/20 and adjuvant  radiation 12/06/20 - 01/01/21.   -She began tamoxifen in 01/2021. She is tolerating well. -most recent mammogram from 06/2022 was benign.  -This more is clinically doing well.  Tolerating tamoxifen without significant side effects.  Residual fatigue and neuropathy from chemo have improved.  Exam is benign, labs are unremarkable.  Overall no clinical concern for recurrence. -She met with research nursing  today -Continue breast cancer surveillance and tamoxifen -Follow-up in 6 months, or sooner if needed   2. Osteopenia  -most recent DEXA on 01/16/21 shows osteopenia with -1.4 at left hip with higher risk of hip fracture. -she is taking calcium and vit D -Zometa discontinued after one dose 11/24/21 due to bilateral bone pain and weakness, she recovered well  -She understands the bone strengthening qualities of tamoxifen  3.  HTN -Recently increased amlodipine to 5 mg daily    PLAN: -Recent mammogram and today's labs reviewed -Continue breast cancer surveillance and tamoxifen -Follow-up in 6 months, or sooner if needed  -Mammogram and DEXA due 06/2023   Orders Placed This Encounter  Procedures   MM DIAG BREAST TOMO BILATERAL    Standing Status:   Future    Standing Expiration Date:   12/01/2023    Order Specific Question:   Reason for Exam (SYMPTOM  OR DIAGNOSIS REQUIRED)    Answer:   h/o left breast cancer 2021 s/p lumpectomy, chemo, RT, on tamoxifen    Order Specific Question:   Preferred imaging location?    Answer:   [facility]   DG Bone Density    Standing Status:   Future    Standing Expiration Date:   11/30/2023    Order Specific Question:   Reason for Exam (SYMPTOM  OR DIAGNOSIS REQUIRED)    Answer:   osteopenia, did not tolerate zometa; h/o left breast cancer 2021 s/p lumpectomy, chemo, RT, on tamoxifen    Order Specific Question:   Preferred imaging location?    Answer:   [facility]      All questions were answered. The patient knows to call the clinic with any  problems, questions or concerns. No barriers to learning were detected.   Cira Rue, NP-C 11/30/2022

## 2023-03-10 ENCOUNTER — Other Ambulatory Visit: Payer: Self-pay | Admitting: Hematology and Oncology

## 2023-03-30 DIAGNOSIS — H353132 Nonexudative age-related macular degeneration, bilateral, intermediate dry stage: Secondary | ICD-10-CM | POA: Diagnosis not present

## 2023-03-30 DIAGNOSIS — H43813 Vitreous degeneration, bilateral: Secondary | ICD-10-CM | POA: Diagnosis not present

## 2023-04-05 ENCOUNTER — Encounter: Payer: Self-pay | Admitting: Nurse Practitioner

## 2023-04-30 ENCOUNTER — Other Ambulatory Visit: Payer: Self-pay | Admitting: Internal Medicine

## 2023-04-30 DIAGNOSIS — Z1231 Encounter for screening mammogram for malignant neoplasm of breast: Secondary | ICD-10-CM

## 2023-05-10 DIAGNOSIS — E785 Hyperlipidemia, unspecified: Secondary | ICD-10-CM | POA: Diagnosis not present

## 2023-05-10 DIAGNOSIS — R7301 Impaired fasting glucose: Secondary | ICD-10-CM | POA: Diagnosis not present

## 2023-05-10 DIAGNOSIS — C50919 Malignant neoplasm of unspecified site of unspecified female breast: Secondary | ICD-10-CM | POA: Diagnosis not present

## 2023-05-12 ENCOUNTER — Ambulatory Visit (HOSPITAL_COMMUNITY)
Admission: RE | Admit: 2023-05-12 | Discharge: 2023-05-12 | Disposition: A | Discharge status: Home / Self Care | Payer: Medicare Other | Source: Ambulatory Visit | Attending: Nurse Practitioner | Admitting: Nurse Practitioner

## 2023-05-12 DIAGNOSIS — C50212 Malignant neoplasm of upper-inner quadrant of left female breast: Secondary | ICD-10-CM | POA: Insufficient documentation

## 2023-05-12 DIAGNOSIS — E2839 Other primary ovarian failure: Secondary | ICD-10-CM | POA: Diagnosis present

## 2023-05-12 DIAGNOSIS — Z170 Estrogen receptor positive status [ER+]: Secondary | ICD-10-CM | POA: Insufficient documentation

## 2023-05-12 DIAGNOSIS — M85832 Other specified disorders of bone density and structure, left forearm: Secondary | ICD-10-CM | POA: Diagnosis not present

## 2023-05-12 DIAGNOSIS — Z780 Asymptomatic menopausal state: Secondary | ICD-10-CM | POA: Diagnosis not present

## 2023-05-14 ENCOUNTER — Other Ambulatory Visit: Payer: Self-pay | Admitting: Hematology

## 2023-05-14 ENCOUNTER — Telehealth: Payer: Self-pay

## 2023-05-14 NOTE — Telephone Encounter (Addendum)
Called patient to relay message below per Santiago Glad NP. Patient voiced full understanding.   ----- Message from Pollyann Samples sent at 05/13/2023  7:55 PM EDT ----- Please let pt know she still has osteopenia and high fracture risk. She did not tolerate zometa. I will talk to her about other meds at next visit. In the mean time please maximize calcium and vit D, and safe weight bearing exercise.   Thanks, Clayborn Heron NP

## 2023-05-17 DIAGNOSIS — Z853 Personal history of malignant neoplasm of breast: Secondary | ICD-10-CM | POA: Diagnosis not present

## 2023-05-17 DIAGNOSIS — Z0001 Encounter for general adult medical examination with abnormal findings: Secondary | ICD-10-CM | POA: Diagnosis not present

## 2023-05-17 DIAGNOSIS — E785 Hyperlipidemia, unspecified: Secondary | ICD-10-CM | POA: Diagnosis not present

## 2023-05-17 DIAGNOSIS — Z0000 Encounter for general adult medical examination without abnormal findings: Secondary | ICD-10-CM | POA: Diagnosis not present

## 2023-05-17 DIAGNOSIS — I10 Essential (primary) hypertension: Secondary | ICD-10-CM | POA: Diagnosis not present

## 2023-05-30 ENCOUNTER — Other Ambulatory Visit: Payer: Self-pay | Admitting: Nurse Practitioner

## 2023-05-30 DIAGNOSIS — Z170 Estrogen receptor positive status [ER+]: Secondary | ICD-10-CM

## 2023-05-30 NOTE — Progress Notes (Unsigned)
Patient Care Team: Doris Perches, MD as PCP - General (Internal Medicine) Doris Angelica, RN as Oncology Nurse Navigator Doris Proud, RN as Oncology Nurse Navigator Doris Kin, MD as Consulting Physician (General Surgery) Doris Puffer, MD as Consulting Physician (Radiation Oncology) Doris Mood, MD as Consulting Physician (Hematology) Doris Samples, NP as Nurse Practitioner (Nurse Practitioner)   CHIEF COMPLAINT: Follow up left breast   Oncology History Overview Note  Cancer Staging Malignant neoplasm of upper-inner quadrant of female breast [facility]) Staging form: Breast, AJCC 8th Edition - Clinical stage from 06/11/2020: Stage IA (cT1b, cN0, cM0, G2, ER+, PR-, HER2: Equivocal) - Signed by Doris Samples, NP on 07/01/2020 - Pathologic stage from 07/05/2020: Stage IA (pT1c, pN0, cM0, G2, ER+, PR-, HER2-, Oncotype DX score: 33) - Signed by Doris Mood, MD on 07/29/2020    Malignant neoplasm of upper-inner quadrant of female breast ([facility])  05/28/2020 Mammogram   Diagnostic left mammogram/US  -On physical exam, I palpate discrete focal soft thickening in the 10-11 o'clock location of the LEFT breast. -Targeted ultrasound is performed, showing an irregular taller than wide hypoechoic mass with indistinct margins in the 10 o'clock location of the LEFT breast 8 centimeters from the nipple. There is associated posterior acoustic shadowing. No significant internal blood flow identified on Doppler evaluation.  IMPRESSION: Suspicious mass in the 10 o'clock location of the LEFT breast. No LEFT axillary adenopathy.     06/11/2020 Cancer Staging   Staging form: Breast, AJCC 8th Edition - Clinical stage from 06/11/2020: Stage IA (cT1b, cN0, cM0, G2, ER+, PR-, HER2: Equivocal) - Signed by Doris Samples, NP on 07/01/2020   06/11/2020 Initial Biopsy   FINAL MICROSCOPIC DIAGNOSIS: A. BREAST, 10:00, LEFT, BIOPSY: - Invasive ductal carcinoma The carcinoma appears Nottingham grade 1-2 of 3 and  measures 0.9 cm in greatest linear extent  PROGNOSTIC INDICATOR RESULTS: The tumor cells are EQUIVOCAL for Her2 (2+) Estrogen Receptor: 90%, MODERATE STAINING INTENSITY Progesterone Receptor: NEGATIVE Proliferation Marker Ki-67: 5%  FLOURESCENCE IN-SITU HYBRIDIZATION RESULTS: GROUP 5: HER2 **NEGATIVE** RATIO of HER2/CEN 17 SIGNALS: 1.18 AVERAGE HER2 COPY NUMBER PER CELL: 1.30   07/01/2020 Initial Diagnosis   Malignant neoplasm of upper-inner quadrant of female breast ([facility])   07/05/2020 Surgery   LEFT BREAST LUMPECTOMY WITH RADIOACTIVE SEED AND LEFT AXILLARY SENTINEL LYMPH NODE BIOPSY by Dr Doris Ellis    07/05/2020 Pathology Results   FINAL MICROSCOPIC DIAGNOSIS:   A. BREAST, LEFT, LUMPECTOMY:  - Invasive ductal carcinoma, 1.8 cm.  - Margins not involved.  - Biopsy site and biopsy clip.  - See oncology table.   B. LYMPH NODE, LEFT AXILLARY, SENTINEL, EXCISION:  - One lymph node with no metastatic carcinoma (0/1).    07/05/2020 Oncotype testing   Oncotype  Recurrence Score 33 Distant recurrence risk at 9 years with Tamoxifen alone is 21% There is >15% benefit of adjuvant chemotherapy.    07/05/2020 Cancer Staging   Staging form: Breast, AJCC 8th Edition - Pathologic stage from 07/05/2020: Stage IA (pT1c, pN0, cM0, G2, ER+, PR-, HER2-, Oncotype DX score: 33) - Signed by Doris Mood, MD on 07/29/2020   08/09/2020 - 10/24/2020 Chemotherapy   Weekly Taxol for 12 weeks starting 08/09/20-10/24/20   12/06/2020 - 01/01/2021 Radiation Therapy   Adjuvant Radiation with Dr Doris Ellis    01/2021 -  Anti-estrogen oral therapy   Tamoxifen 20mg  daily starting in late 01/2021   04/28/2021 Survivorship   SCP delivered by Doris Glad, NP  CURRENT THERAPY:  Tamoxifen 20 mg daily starting late 01/2021   INTERVAL HISTORY Doris Ellis returns for follow up as scheduled. Last seen by me 11/30/22. DEXA 05/12/23 showed persistent osteopenia and high fracture risk. She continues tamoxifen.   ROS   Past Medical  History:  Diagnosis Date   Anxiety    Arthritis    Cancer ([facility]) 05/2020   left breast IDC   Concussion 1985   Depression    Hyperlipidemia    Hypertension    Mild concussion 2002   tripped over bag at work   Osteopenia 07/2011   T score -1.8 FRAX 11%/1.5%   Panic attacks    Personal history of chemotherapy    Personal history of radiation therapy    Seasonal allergies      Past Surgical History:  Procedure Laterality Date   ABDOMINAL HYSTERECTOMY  01/1989   TAH BSO   BACK SURGERY  1996   ruputured disc L-5   BLADDER SUSPENSION  1982   BREAST BIOPSY Left    BREAST LUMPECTOMY Left 07/05/2020   BREAST LUMPECTOMY WITH RADIOACTIVE SEED AND SENTINEL LYMPH NODE BIOPSY Left 07/05/2020   Procedure: LEFT BREAST LUMPECTOMY WITH RADIOACTIVE SEED AND LEFT AXILLARY SENTINEL LYMPH NODE BIOPSY;  Surgeon: Doris Kin, MD;  Location: [facility];  Service: General;  Laterality: Left;  LEFT BREAST CANCER   BREAST SURGERY  1984   cyst-benign   COLONOSCOPY N/A 03/30/2013   Procedure: COLONOSCOPY;  Surgeon: Doris Hippo, MD;  Location: [facility];  Service: Endoscopy;  Laterality: N/A;  930   COLONOSCOPY WITH PROPOFOL N/A 03/04/2022   Procedure: COLONOSCOPY WITH PROPOFOL;  Surgeon: Doris Hippo, MD;  Location: [facility];  Service: Endoscopy;  Laterality: N/A;  820   DILATION AND CURETTAGE OF UTERUS  12/09/1988   abnormal uterine bleeding   IR IMAGING GUIDED PORT INSERTION  08/05/2020   POLYPECTOMY  03/04/2022   Procedure: POLYPECTOMY;  Surgeon: Doris Hippo, MD;  Location: [facility];  Service: Endoscopy;;  cecal x2; sigmoid colon;   PORT-A-CATH REMOVAL Right 04/17/2021   Procedure: REMOVAL PORT-A-CATH;  Surgeon: Doris Bouillon, MD;  Location: [facility];  Service: General;  Laterality: Right;   TONSILLECTOMY       Outpatient Encounter Medications as of 06/01/2023  Medication Sig   ALPRAZolam (XANAX) 0.5 MG tablet Take 0.5 mg by mouth See  admin instructions. Take 0.5 mg at bedtime, may take a second 0.5 mg dose later in the night as needed for sleep   amLODipine (NORVASC) 2.5 MG tablet Take 5 mg by mouth daily.   aspirin EC 81 MG tablet Take 1 tablet (81 mg total) by mouth daily. Swallow whole.   Calcium Carbonate-Vit D-Min (QC CALCIUM-MAGNESIUM-ZINC-D3 PO) Take 1 tablet by mouth daily.   Carboxymethylcellul-Glycerin (LUBRICATING EYE DROPS OP) Place 1 drop into both eyes daily as needed (dry eyes).   cyclobenzaprine (FLEXERIL) 10 MG tablet TAKE 1 TABLET(10 MG) BY MOUTH THREE TIMES DAILY AS NEEDED FOR MUSCLE SPASMS   docusate sodium (COLACE) 100 MG capsule Take 100 mg by mouth daily as needed for mild constipation.   ibuprofen (ADVIL) 800 MG tablet Take 800 mg by mouth every 8 (eight) hours as needed for pain.   Multiple Vitamin (MULTIVITAMIN WITH MINERALS) TABS tablet Take 1 tablet by mouth daily.   pravastatin (PRAVACHOL) 10 MG tablet Take 10 mg by mouth daily.   pyridOXINE (VITAMIN B-6) 100 MG tablet Take 100 mg by mouth  daily.   sertraline (ZOLOFT) 50 MG tablet Take 50 mg by mouth daily.   Sodium Chloride-Sodium Bicarb (AYR SALINE NASAL RINSE NA) Place 1 Dose into the nose daily.   tamoxifen (NOLVADEX) 20 MG tablet TAKE 1 TABLET(20 MG) BY MOUTH DAILY   No facility-administered encounter medications on file as of 06/01/2023.     There were no vitals filed for this visit. There is no height or weight on file to calculate BMI.   PHYSICAL EXAM GENERAL:alert, no distress and comfortable SKIN: no rash  EYES: sclera clear NECK: without mass LYMPH:  no palpable cervical or supraclavicular lymphadenopathy  LUNGS: clear with normal breathing effort HEART: regular rate & rhythm, no lower extremity edema ABDOMEN: abdomen soft, non-tender and normal bowel sounds NEURO: alert & oriented x 3 with fluent speech, no focal motor/sensory deficits Breast exam:  PAC without erythema    CBC    Component Value Date/Time   WBC 7.2  11/30/2022 1120   WBC 6.1 05/27/2022 1440   RBC 4.14 11/30/2022 1120   HGB 12.9 11/30/2022 1120   HCT 37.2 11/30/2022 1120   PLT 285 11/30/2022 1120   MCV 89.9 11/30/2022 1120   MCH 31.2 11/30/2022 1120   MCHC 34.7 11/30/2022 1120   RDW 13.5 11/30/2022 1120   LYMPHSABS 1.6 11/30/2022 1120   MONOABS 0.6 11/30/2022 1120   EOSABS 0.2 11/30/2022 1120   BASOSABS 0.1 11/30/2022 1120     CMP     Component Value Date/Time   NA 133 (L) 11/30/2022 1120   K 4.2 11/30/2022 1120   CL 101 11/30/2022 1120   CO2 23 11/30/2022 1120   GLUCOSE 107 (H) 11/30/2022 1120   BUN 12 11/30/2022 1120   CREATININE 0.74 11/30/2022 1120   CALCIUM 9.6 11/30/2022 1120   PROT 8.3 (H) 11/30/2022 1120   ALBUMIN 3.8 11/30/2022 1120   AST 21 11/30/2022 1120   ALT 6 11/30/2022 1120   ALKPHOS 28 (L) 11/30/2022 1120   BILITOT 0.4 11/30/2022 1120   GFRNONAA >60 11/30/2022 1120   GFRAA >60 08/05/2020 1200     ASSESSMENT & PLAN:Doris Ellis is a 82 yo post-hysterectomy female with    1. Left breast cancer, grade 1-2, ER+/PR-/HER2- Ki-67 5%, pT1cN0M0, Grade 1, stage IA, RS 33 -diagnosed in 06/2020. S/p left lumpectomy with SLNB Doris Ellis). Oncotype Dx RS of 33, showing high risk. She completed adjuvant Chemo with weekly Taxol 08/09/20 - 10/24/20 and adjuvant radiation 12/06/20 - 01/01/21.   -She began tamoxifen in 01/2021. She is tolerating well. -most recent mammogram from 06/2022 was benign.    2. Osteopenia  -most recent DEXA on 01/16/21 shows osteopenia with -1.4 at left hip with higher risk of hip fracture. -she is taking calcium and vit D -Zometa discontinued after one dose 11/24/21 due to bilateral bone pain and weakness, she recovered well  -DEXA 05/2023 showed osteopenia and high frax score   3.  HTN -Recently increased amlodipine to 5 mg daily      PLAN:  No orders of the defined types were placed in this encounter.     All questions were answered. The patient knows to call the clinic with any problems,  questions or concerns. No barriers to learning were detected. I spent *** counseling the patient face to face. The total time spent in the appointment was *** and more than 50% was on counseling, review of test results, and coordination of care.   Doris Glad, NP-C @DATE @

## 2023-06-01 ENCOUNTER — Other Ambulatory Visit: Payer: Self-pay

## 2023-06-01 ENCOUNTER — Encounter: Payer: Self-pay | Admitting: Nurse Practitioner

## 2023-06-01 ENCOUNTER — Inpatient Hospital Stay: Payer: Medicare Other

## 2023-06-01 ENCOUNTER — Inpatient Hospital Stay: Payer: Medicare Other | Attending: Nurse Practitioner | Admitting: Nurse Practitioner

## 2023-06-01 VITALS — BP 141/61 | HR 77 | Temp 99.7°F | Resp 18 | Ht 64.0 in | Wt 131.0 lb

## 2023-06-01 DIAGNOSIS — C50212 Malignant neoplasm of upper-inner quadrant of left female breast: Secondary | ICD-10-CM | POA: Insufficient documentation

## 2023-06-01 DIAGNOSIS — Z170 Estrogen receptor positive status [ER+]: Secondary | ICD-10-CM | POA: Insufficient documentation

## 2023-06-01 DIAGNOSIS — Z90722 Acquired absence of ovaries, bilateral: Secondary | ICD-10-CM | POA: Insufficient documentation

## 2023-06-01 DIAGNOSIS — Z79810 Long term (current) use of selective estrogen receptor modulators (SERMs): Secondary | ICD-10-CM | POA: Insufficient documentation

## 2023-06-01 DIAGNOSIS — Z90710 Acquired absence of both cervix and uterus: Secondary | ICD-10-CM | POA: Diagnosis not present

## 2023-06-01 DIAGNOSIS — Z923 Personal history of irradiation: Secondary | ICD-10-CM | POA: Diagnosis not present

## 2023-06-01 DIAGNOSIS — Z7982 Long term (current) use of aspirin: Secondary | ICD-10-CM | POA: Diagnosis not present

## 2023-06-01 DIAGNOSIS — Z9221 Personal history of antineoplastic chemotherapy: Secondary | ICD-10-CM | POA: Diagnosis not present

## 2023-06-01 DIAGNOSIS — M85852 Other specified disorders of bone density and structure, left thigh: Secondary | ICD-10-CM | POA: Insufficient documentation

## 2023-06-01 DIAGNOSIS — Z79899 Other long term (current) drug therapy: Secondary | ICD-10-CM | POA: Insufficient documentation

## 2023-06-01 LAB — CBC WITH DIFFERENTIAL (CANCER CENTER ONLY)
Abs Immature Granulocytes: 0.02 10*3/uL (ref 0.00–0.07)
Basophils Absolute: 0.1 10*3/uL (ref 0.0–0.1)
Basophils Relative: 1 %
Eosinophils Absolute: 0.2 10*3/uL (ref 0.0–0.5)
Eosinophils Relative: 4 %
HCT: 38.8 % (ref 36.0–46.0)
Hemoglobin: 13.1 g/dL (ref 12.0–15.0)
Immature Granulocytes: 0 %
Lymphocytes Relative: 22 %
Lymphs Abs: 1.3 10*3/uL (ref 0.7–4.0)
MCH: 30.1 pg (ref 26.0–34.0)
MCHC: 33.8 g/dL (ref 30.0–36.0)
MCV: 89.2 fL (ref 80.0–100.0)
Monocytes Absolute: 0.5 10*3/uL (ref 0.1–1.0)
Monocytes Relative: 8 %
Neutro Abs: 3.7 10*3/uL (ref 1.7–7.7)
Neutrophils Relative %: 65 %
Platelet Count: 275 10*3/uL (ref 150–400)
RBC: 4.35 MIL/uL (ref 3.87–5.11)
RDW: 14.1 % (ref 11.5–15.5)
WBC Count: 5.7 10*3/uL (ref 4.0–10.5)
nRBC: 0 % (ref 0.0–0.2)

## 2023-06-01 LAB — CMP (CANCER CENTER ONLY)
ALT: 9 U/L (ref 0–44)
AST: 23 U/L (ref 15–41)
Albumin: 4 g/dL (ref 3.5–5.0)
Alkaline Phosphatase: 29 U/L — ABNORMAL LOW (ref 38–126)
Anion gap: 10 (ref 5–15)
BUN: 13 mg/dL (ref 8–23)
CO2: 25 mmol/L (ref 22–32)
Calcium: 10.6 mg/dL — ABNORMAL HIGH (ref 8.9–10.3)
Chloride: 101 mmol/L (ref 98–111)
Creatinine: 0.81 mg/dL (ref 0.44–1.00)
GFR, Estimated: >60 mL/min (ref >60)
Glucose, Bld: 113 mg/dL — ABNORMAL HIGH (ref 70–99)
Potassium: 4.4 mmol/L (ref 3.5–5.1)
Sodium: 136 mmol/L (ref 135–145)
Total Bilirubin: 0.4 mg/dL (ref 0.3–1.2)
Total Protein: 8.7 g/dL — ABNORMAL HIGH (ref 6.5–8.1)

## 2023-06-07 ENCOUNTER — Telehealth: Payer: Self-pay

## 2023-06-07 ENCOUNTER — Other Ambulatory Visit: Payer: Self-pay

## 2023-06-07 NOTE — Telephone Encounter (Signed)
Spoke with pt via telephone regarding Fosamax prescription.  Informed pt that Dr. Grafton Folk dentist office does not want the pt taking Fosamax for 6 months d/t pt just recently had dental implants done 05/27/2023.  Pt verbalized understanding and had no further question or concerns at this time.

## 2023-06-07 NOTE — Telephone Encounter (Signed)
Received signed dental clearance from Dr. Lynnell Grain for patient to be able to begin Fosamax. Per Dr. Lynnell Grain patient should wait 5 months form 06/07/23 to begin Fosamax. Gave copy of form to Dr. Mosetta Putt and placed original in to be scanned file.

## 2023-06-11 ENCOUNTER — Ambulatory Visit: Payer: Medicare Other

## 2023-06-11 ENCOUNTER — Ambulatory Visit: Admission: RE | Admit: 2023-06-11 | Payer: Medicare Other | Source: Ambulatory Visit

## 2023-06-11 DIAGNOSIS — Z1231 Encounter for screening mammogram for malignant neoplasm of breast: Secondary | ICD-10-CM | POA: Diagnosis not present

## 2023-06-28 ENCOUNTER — Telehealth: Payer: Self-pay

## 2023-06-28 DIAGNOSIS — C50212 Malignant neoplasm of upper-inner quadrant of left female breast: Secondary | ICD-10-CM

## 2023-06-28 NOTE — Telephone Encounter (Signed)
[id number] - A Prospective Observational Cohort Study to Develop a Predictive Model of Taxane-Induced Peripheral Neuropathy in Cancer Patients    156 Weeks: Called the patient on the phone to complete study procedures for the s1714 study.   PROs: Completed EORTC QLQ-CIPN20, PRO-CTCAE, and FACT/GOG-NTX-4    Patient did complain about having some numbness in their feet.  Felecia Jan, [facility] 06/28/2023 2:31 PM

## 2023-08-10 ENCOUNTER — Other Ambulatory Visit: Payer: Self-pay | Admitting: Hematology

## 2023-09-09 ENCOUNTER — Telehealth: Payer: Self-pay | Admitting: *Deleted

## 2023-09-09 ENCOUNTER — Other Ambulatory Visit: Payer: Self-pay

## 2023-09-09 NOTE — Telephone Encounter (Signed)
[id number] week 156 follow up call: Called patient to complete the Solicited Neuropathy assessment for week 156 of this study.  This data was not collected on previous research call.  LVM asking patient to kindly call research nurse back to complete this by phone at her convenience.  Domenica Reamer, BSN, RN, [employer] Clinical Research Nurse II [phone] 09/09/2023 10:21 AM

## 2023-09-16 ENCOUNTER — Telehealth: Payer: Self-pay | Admitting: *Deleted

## 2023-09-16 NOTE — Telephone Encounter (Signed)
Z6109 - A Prospective Observational Cohort Study to Develop a Predictive Model of Taxane-Induced Peripheral Neuropathy in Cancer Patients  Week 156-  Called patient to clarify neuropathy symptoms to complete the solicited neuropathy form for this study.  Patient states she continues to have ongoing numbness and tingling in bilateral feet.  It does not interfere with ADLs and she does not take any medications for this.  She does not feel it has gotten any better since she completed chemotherapy but possibly a little worse.  She denies neuropathy symptoms in her hands but states they are weaker and she has trouble picking up things.  Informed patient this completes her participation on this study and thanked her for her time and contribution to research.  Domenica Reamer, BSN, RN, [employer] Nurse II [phone] 09/16/2023 11:48 AM

## 2023-09-22 NOTE — Telephone Encounter (Signed)
Telephone call  

## 2023-09-27 DIAGNOSIS — H43813 Vitreous degeneration, bilateral: Secondary | ICD-10-CM | POA: Diagnosis not present

## 2023-09-27 DIAGNOSIS — H353132 Nonexudative age-related macular degeneration, bilateral, intermediate dry stage: Secondary | ICD-10-CM | POA: Diagnosis not present

## 2023-12-01 ENCOUNTER — Other Ambulatory Visit: Payer: Self-pay

## 2023-12-01 DIAGNOSIS — Z170 Estrogen receptor positive status [ER+]: Secondary | ICD-10-CM

## 2023-12-01 NOTE — Progress Notes (Signed)
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 S/p left lumpectomy with SLNB [name]). Oncotype Dx RS of 33, showing high risk. She completed adjuvant Chemo with weekly Taxol 08/09/20 - 10/24/20 and adjuvant radiation 12/06/20 - 01/01/21.   -She began tamoxifen in 01/2021. She is tolerating well. -Mammogram from 06/2022 was benign.  -Ms. Spagna is clinically doing well.  Tolerating tamoxifen without significant side effects.  Breast exam is benign, labs are unremarkable.  Calcium 10.6 likely from dairy and Ca supplement -Overall no clinical concern for recurrence.  Continue breast cancer surveillance and tamoxifen -Mammo 06/11/2023 yielded negative results.  -Follow-up with [place] in 6 months, or sooner if needed   Plan: Labs reviewed  -CBC showing WBC 7.6; Hgb 12.7; Hct 38.6; Plt 271; Anc 4.8 -CMP - K 4.2; glucose 117; BUN 14; Creatinine 0.81; eGFR >60; Ca 9.5; LFTs normal.   -Reviewed most recent mammogram done 06/11/2023 which was negative.  Recall expected in 1 year. Continue tamoxifen 20 mg daily. Continue calcium and vitamin D every day.  Stay active with low impact exercises. Labs and follow-up in 6 months.  The patient understands the plans discussed today and is in agreement with them.  She knows to contact our office if she develops concerns prior to her next appointment.  I  provided 25 minutes of face-to-face time during this encounter and > 50% was spent counseling as documented under my assessment and plan.    Carlean Jews, NP  [facility] - A DEPT [facility] [address] [place] [address] Dept: [phone] Dept Fax: [phone]   No orders of the defined types were placed in this encounter.     CHIEF COMPLAINT:  CC: Left breast cancer, estrogen receptor positive  Current Treatment: Tamoxifen 20 mg daily, started April 2022.  INTERVAL HISTORY:  Doris Ellis is here today for repeat clinical assessment.  She was last seen by Clayborn Heron, NP on 06/01/2023.  She had bone density test on 05/12/2023 which showed persistent osteopenia with a high fracture risk.  She does take calcium and vitamin D every day.  She eats a great deal of dairy food.  She walks every day.  Her last screening mammogram was done 06/11/2023.  Results were negative with recall expected in 1 year.  Has been tolerating tamoxifen well.  She denies fevers or chills. She denies pain. Her appetite is good. Her weight has increased 2 pounds over last 6 months  .  I have reviewed the past medical history, past surgical history, social history and family history with the patient and they are unchanged from previous note.  ALLERGIES:  is allergic to lipitor [atorvastatin calcium], zocor [simvastatin - high dose], and benadryl [diphenhydramine].  MEDICATIONS:  Current Outpatient Medications  Medication Sig Dispense Refill   ALPRAZolam (XANAX) 0.5 MG tablet Take 0.5 mg by mouth See admin instructions. Take 0.5  mg at bedtime, may take a second 0.5 mg dose later in the night as needed for sleep     amLODipine (NORVASC) 2.5 MG tablet Take 5 mg by mouth daily.     aspirin EC 81 MG tablet Take 1 tablet (81 mg total) by mouth daily. Swallow whole. 30 tablet 11   Calcium Carbonate-Vit D-Min (QC CALCIUM-MAGNESIUM-ZINC-D3 PO) Take 1 tablet  by mouth daily.     Carboxymethylcellul-Glycerin (LUBRICATING EYE DROPS OP) Place 1 drop into both eyes daily as needed (dry eyes).     cyclobenzaprine (FLEXERIL) 10 MG tablet TAKE 1 TABLET(10 MG) BY MOUTH THREE TIMES DAILY AS NEEDED FOR MUSCLE SPASMS 30 tablet 0   docusate sodium (COLACE) 100 MG capsule Take 100 mg by mouth daily as needed for mild constipation.     ibuprofen (ADVIL) 800 MG tablet Take 800 mg by mouth every 8 (eight) hours as needed for pain.     Multiple Vitamin (MULTIVITAMIN WITH MINERALS) TABS tablet Take 1 tablet by mouth daily.     pravastatin (PRAVACHOL) 10 MG tablet Take 10 mg by mouth daily.     pyridOXINE (VITAMIN B-6) 100 MG tablet Take 100 mg by mouth daily.     sertraline (ZOLOFT) 50 MG tablet Take 50 mg by mouth daily.     Sodium Chloride-Sodium Bicarb (AYR SALINE NASAL RINSE NA) Place 1 Dose into the nose daily.     tamoxifen (NOLVADEX) 20 MG tablet TAKE 1 TABLET(20 MG) BY MOUTH DAILY 30 tablet 3   No current facility-administered medications for this visit.    HISTORY OF PRESENT ILLNESS:   Oncology History Overview Note  Cancer Staging Malignant neoplasm of upper-inner quadrant of female breast [facility]) Staging form: Breast, AJCC 8th Edition - Clinical stage from 06/11/2020: Stage IA (cT1b, cN0, cM0, G2, ER+, PR-, HER2: Equivocal) - Signed by Pollyann Samples, NP on 07/01/2020 - Pathologic stage from 07/05/2020: Stage IA (pT1c, pN0, cM0, G2, ER+, PR-, HER2-, Oncotype DX score: 33) - Signed by Malachy Mood, MD on 07/29/2020    Malignant neoplasm of upper-inner quadrant of female breast (HCC)  05/28/2020 Mammogram   Diagnostic left mammogram/US  -On physical exam, I palpate discrete focal soft thickening in the 10-11 o'clock location of the LEFT breast. -Targeted ultrasound is performed, showing an irregular taller than wide hypoechoic mass with indistinct margins in the 10 o'clock location of the LEFT breast 8 centimeters from the nipple. There is associated  posterior acoustic shadowing. No significant internal blood flow identified on Doppler evaluation.  IMPRESSION: Suspicious mass in the 10 o'clock location of the LEFT breast. No LEFT axillary adenopathy.     06/11/2020 Cancer Staging   Staging form: Breast, AJCC 8th Edition - Clinical stage from 06/11/2020: Stage IA (cT1b, cN0, cM0, G2, ER+, PR-, HER2: Equivocal) - Signed by Pollyann Samples, NP on 07/01/2020   06/11/2020 Initial Biopsy   FINAL MICROSCOPIC DIAGNOSIS: A. BREAST, 10:00, LEFT, BIOPSY: - Invasive ductal carcinoma The carcinoma appears Nottingham grade 1-2 of 3 and measures 0.9 cm in greatest linear extent  PROGNOSTIC INDICATOR RESULTS: The tumor cells are EQUIVOCAL for Her2 (2+) Estrogen Receptor: 90%, MODERATE STAINING INTENSITY Progesterone Receptor: NEGATIVE Proliferation Marker Ki-67: 5%  FLOURESCENCE IN-SITU HYBRIDIZATION RESULTS: GROUP 5: HER2 **NEGATIVE** RATIO of HER2/CEN 17 SIGNALS: 1.18 AVERAGE HER2 COPY NUMBER PER CELL: 1.30   07/01/2020 Initial Diagnosis   Malignant neoplasm of upper-inner quadrant of female breast (HCC)   07/05/2020 Surgery   LEFT BREAST LUMPECTOMY WITH RADIOACTIVE SEED AND  LEFT AXILLARY SENTINEL LYMPH NODE BIOPSY by Dr [name]    07/05/2020 Pathology Results   FINAL MICROSCOPIC DIAGNOSIS:   A. BREAST, LEFT, LUMPECTOMY:  - Invasive ductal carcinoma, 1.8 cm.  - Margins not involved.  - Biopsy site and biopsy clip.  - See oncology table.   B. LYMPH NODE, LEFT AXILLARY, SENTINEL, EXCISION:  - One lymph node with no metastatic carcinoma (0/1).    07/05/2020 Oncotype testing   Oncotype  Recurrence Score 33 Distant recurrence risk at 9 years with Tamoxifen alone is 21% There is >15% benefit of adjuvant chemotherapy.    07/05/2020 Cancer Staging   Staging form: Breast, AJCC 8th Edition - Pathologic stage from 07/05/2020: Stage IA (pT1c, pN0, cM0, G2, ER+, PR-, HER2-, Oncotype DX score: 33) - Signed by Malachy Mood, MD on 07/29/2020   08/09/2020 -  10/24/2020 Chemotherapy   Weekly Taxol for 12 weeks starting 08/09/20-10/24/20   12/06/2020 - 01/01/2021 Radiation Therapy   Adjuvant Radiation with Dr Mitzi Hansen    01/2021 -  Anti-estrogen oral therapy   Tamoxifen 20mg  daily starting in late 01/2021   04/28/2021 Survivorship   SCP delivered by Santiago Glad, NP    06/11/2023 Mammogram   3D screening mammogram  IMPRESSION: No mammographic evidence of malignancy.   RECOMMENDATION: Screening mammogram in one year. (Code:[id number]-01Y)  BI-RADS CATEGORY  1: Negative.       REVIEW OF SYSTEMS:   Constitutional: Denies fevers, chills or abnormal weight loss Eyes: Denies blurriness of vision Ears, nose, mouth, throat, and face: Denies mucositis or sore throat Respiratory: Denies cough, dyspnea or wheezes Cardiovascular: Denies palpitation, chest discomfort or lower extremity swelling Gastrointestinal:  Denies nausea, heartburn or change in bowel habits Skin: Denies abnormal skin rashes Lymphatics: Denies new lymphadenopathy or easy bruising Neurological:Denies numbness, tingling or new weaknesses Behavioral/Psych: Mood is stable, no new changes  All other systems were reviewed with the patient and are negative.   VITALS:   Today's Vitals   12/02/23 0933 12/02/23 0941  BP: 126/60   Pulse: 65   Resp: 17   Temp: 97.6 F (36.4 C)   TempSrc: Temporal   SpO2: 100%   Weight: 129 lb 4.8 oz (58.7 kg)   PainSc:  0-No pain   Body mass index is 22.19 kg/m.   Wt Readings from Last 3 Encounters:  12/02/23 129 lb 4.8 oz (58.7 kg)  06/01/23 131 lb (59.4 kg)  11/30/22 139 lb 11.2 oz (63.4 kg)    Body mass index is 22.19 kg/m.  Performance status (ECOG): 1 - Symptomatic but completely ambulatory  PHYSICAL EXAM:   GENERAL:alert, no distress and comfortable SKIN: skin color, texture, turgor are normal, no rashes or significant lesions EYES: normal, Conjunctiva are pink and non-injected, sclera clear OROPHARYNX:no exudate, no erythema and lips,  buccal mucosa, and tongue normal  NECK: supple, thyroid normal size, non-tender, without nodularity LYMPH:  no palpable lymphadenopathy in the cervical, axillary or inguinal LUNGS: clear to auscultation and percussion with normal breathing effort HEART: regular rate & rhythm and no murmurs and no lower extremity edema ABDOMEN:abdomen soft, non-tender and normal bowel sounds Musculoskeletal:no cyanosis of digits and no clubbing  NEURO: alert & oriented x 3 with fluent speech, no focal motor/sensory deficits BREAST: No lumps or masses in the right breast.  There is no nipple inversion or discharge.  There is no axillary lymphadenopathy on the right side.  There is well-healed on the left breast.  No masses or lumps today.  There is no nipple  inversion or nipple discharge.  There is no axillary lymphadenopathy on the left side.   LABORATORY DATA:  I have reviewed the data as listed    Component Value Date/Time   NA 132 (L) 12/02/2023 0842   K 4.2 12/02/2023 0842   CL 99 12/02/2023 0842   CO2 24 12/02/2023 0842   GLUCOSE 117 (H) 12/02/2023 0842   BUN 14 12/02/2023 0842   CREATININE 0.81 12/02/2023 0842   CALCIUM 9.5 12/02/2023 0842   PROT 8.8 (H) 12/02/2023 0842   ALBUMIN 4.1 12/02/2023 0842   AST 23 12/02/2023 0842   ALT 7 12/02/2023 0842   ALKPHOS 30 (L) 12/02/2023 0842   BILITOT 0.5 12/02/2023 0842   GFRNONAA >60 12/02/2023 0842   GFRAA >60 08/05/2020 1200      Lab Results  Component Value Date   WBC 7.6 12/02/2023   NEUTROABS 4.8 12/02/2023   HGB 12.7 12/02/2023   HCT 38.6 12/02/2023   MCV 91.9 12/02/2023   PLT 271 12/02/2023

## 2023-12-01 NOTE — Assessment & Plan Note (Addendum)
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 S/p left lumpectomy with SLNB [name]). Oncotype Dx RS of 33, showing high risk. She completed adjuvant Chemo with weekly Taxol 08/09/20 - 10/24/20 and adjuvant radiation 12/06/20 - 01/01/21.   -She began tamoxifen in 01/2021. She is tolerating well. -Mammogram from 06/2022 was benign.  -Ms. Doris Ellis is clinically doing well.  Tolerating tamoxifen without significant side effects.  Breast exam is benign, labs are unremarkable.  Calcium 10.6 likely from dairy and Ca supplement -Overall no clinical concern for recurrence.  Continue breast cancer surveillance and tamoxifen -Mammo 06/11/2023 yielded negative results.  -Follow-up with [place] in 6 months, or sooner if needed

## 2023-12-02 ENCOUNTER — Inpatient Hospital Stay: Payer: Medicare HMO | Attending: Nurse Practitioner

## 2023-12-02 ENCOUNTER — Inpatient Hospital Stay (HOSPITAL_BASED_OUTPATIENT_CLINIC_OR_DEPARTMENT_OTHER): Payer: Medicare HMO | Admitting: Nurse Practitioner

## 2023-12-02 ENCOUNTER — Other Ambulatory Visit: Payer: Self-pay

## 2023-12-02 VITALS — BP 126/60 | HR 65 | Temp 97.6°F | Resp 17 | Wt 129.3 lb

## 2023-12-02 DIAGNOSIS — Z923 Personal history of irradiation: Secondary | ICD-10-CM | POA: Diagnosis not present

## 2023-12-02 DIAGNOSIS — Z1722 Progesterone receptor negative status: Secondary | ICD-10-CM | POA: Insufficient documentation

## 2023-12-02 DIAGNOSIS — Z7982 Long term (current) use of aspirin: Secondary | ICD-10-CM | POA: Insufficient documentation

## 2023-12-02 DIAGNOSIS — Z79810 Long term (current) use of selective estrogen receptor modulators (SERMs): Secondary | ICD-10-CM | POA: Diagnosis not present

## 2023-12-02 DIAGNOSIS — Z1732 Human epidermal growth factor receptor 2 negative status: Secondary | ICD-10-CM | POA: Diagnosis not present

## 2023-12-02 DIAGNOSIS — Z170 Estrogen receptor positive status [ER+]: Secondary | ICD-10-CM | POA: Diagnosis not present

## 2023-12-02 DIAGNOSIS — C50212 Malignant neoplasm of upper-inner quadrant of left female breast: Secondary | ICD-10-CM

## 2023-12-02 DIAGNOSIS — Z79899 Other long term (current) drug therapy: Secondary | ICD-10-CM | POA: Diagnosis not present

## 2023-12-02 LAB — CBC WITH DIFFERENTIAL (CANCER CENTER ONLY)
Abs Immature Granulocytes: 0.01 10*3/uL (ref 0.00–0.07)
Basophils Absolute: 0.1 10*3/uL (ref 0.0–0.1)
Basophils Relative: 1 %
Eosinophils Absolute: 0.2 10*3/uL (ref 0.0–0.5)
Eosinophils Relative: 3 %
HCT: 38.6 % (ref 36.0–46.0)
Hemoglobin: 12.7 g/dL (ref 12.0–15.0)
Immature Granulocytes: 0 %
Lymphocytes Relative: 25 %
Lymphs Abs: 1.9 10*3/uL (ref 0.7–4.0)
MCH: 30.2 pg (ref 26.0–34.0)
MCHC: 32.9 g/dL (ref 30.0–36.0)
MCV: 91.9 fL (ref 80.0–100.0)
Monocytes Absolute: 0.6 10*3/uL (ref 0.1–1.0)
Monocytes Relative: 8 %
Neutro Abs: 4.8 10*3/uL (ref 1.7–7.7)
Neutrophils Relative %: 63 %
Platelet Count: 271 10*3/uL (ref 150–400)
RBC: 4.2 MIL/uL (ref 3.87–5.11)
RDW: 14 % (ref 11.5–15.5)
WBC Count: 7.6 10*3/uL (ref 4.0–10.5)
nRBC: 0 % (ref 0.0–0.2)

## 2023-12-02 LAB — CMP (CANCER CENTER ONLY)
ALT: 7 U/L (ref 0–44)
AST: 23 U/L (ref 15–41)
Albumin: 4.1 g/dL (ref 3.5–5.0)
Alkaline Phosphatase: 30 U/L — ABNORMAL LOW (ref 38–126)
Anion gap: 9 (ref 5–15)
BUN: 14 mg/dL (ref 8–23)
CO2: 24 mmol/L (ref 22–32)
Calcium: 9.5 mg/dL (ref 8.9–10.3)
Chloride: 99 mmol/L (ref 98–111)
Creatinine: 0.81 mg/dL (ref 0.44–1.00)
GFR, Estimated: >60 mL/min (ref >60)
Glucose, Bld: 117 mg/dL — ABNORMAL HIGH (ref 70–99)
Potassium: 4.2 mmol/L (ref 3.5–5.1)
Sodium: 132 mmol/L — ABNORMAL LOW (ref 135–145)
Total Bilirubin: 0.5 mg/dL (ref 0.0–1.2)
Total Protein: 8.8 g/dL — ABNORMAL HIGH (ref 6.5–8.1)

## 2023-12-05 ENCOUNTER — Encounter: Payer: Self-pay | Admitting: Nurse Practitioner

## 2024-02-01 ENCOUNTER — Other Ambulatory Visit: Payer: Self-pay | Admitting: Hematology

## 2024-04-03 DIAGNOSIS — H43813 Vitreous degeneration, bilateral: Secondary | ICD-10-CM | POA: Diagnosis not present

## 2024-04-03 DIAGNOSIS — H353132 Nonexudative age-related macular degeneration, bilateral, intermediate dry stage: Secondary | ICD-10-CM | POA: Diagnosis not present

## 2024-04-05 DIAGNOSIS — H35033 Hypertensive retinopathy, bilateral: Secondary | ICD-10-CM | POA: Diagnosis not present

## 2024-04-05 DIAGNOSIS — Z0100 Encounter for examination of eyes and vision without abnormal findings: Secondary | ICD-10-CM | POA: Diagnosis not present

## 2024-04-05 DIAGNOSIS — H353131 Nonexudative age-related macular degeneration, bilateral, early dry stage: Secondary | ICD-10-CM | POA: Diagnosis not present

## 2024-04-05 DIAGNOSIS — Z961 Presence of intraocular lens: Secondary | ICD-10-CM | POA: Diagnosis not present

## 2024-05-03 ENCOUNTER — Other Ambulatory Visit: Payer: Self-pay | Admitting: Hematology

## 2024-05-11 ENCOUNTER — Other Ambulatory Visit: Payer: Self-pay | Admitting: Internal Medicine

## 2024-05-11 DIAGNOSIS — Z1231 Encounter for screening mammogram for malignant neoplasm of breast: Secondary | ICD-10-CM

## 2024-05-11 DIAGNOSIS — Z853 Personal history of malignant neoplasm of breast: Secondary | ICD-10-CM | POA: Diagnosis not present

## 2024-05-11 DIAGNOSIS — R7301 Impaired fasting glucose: Secondary | ICD-10-CM | POA: Diagnosis not present

## 2024-05-11 DIAGNOSIS — I10 Essential (primary) hypertension: Secondary | ICD-10-CM | POA: Diagnosis not present

## 2024-05-11 DIAGNOSIS — E785 Hyperlipidemia, unspecified: Secondary | ICD-10-CM | POA: Diagnosis not present

## 2024-05-11 DIAGNOSIS — M1990 Unspecified osteoarthritis, unspecified site: Secondary | ICD-10-CM | POA: Diagnosis not present

## 2024-05-18 DIAGNOSIS — Z0001 Encounter for general adult medical examination with abnormal findings: Secondary | ICD-10-CM | POA: Diagnosis not present

## 2024-05-18 DIAGNOSIS — I10 Essential (primary) hypertension: Secondary | ICD-10-CM | POA: Diagnosis not present

## 2024-05-18 DIAGNOSIS — E785 Hyperlipidemia, unspecified: Secondary | ICD-10-CM | POA: Diagnosis not present

## 2024-05-18 DIAGNOSIS — R778 Other specified abnormalities of plasma proteins: Secondary | ICD-10-CM | POA: Diagnosis not present

## 2024-05-18 DIAGNOSIS — Z853 Personal history of malignant neoplasm of breast: Secondary | ICD-10-CM | POA: Diagnosis not present

## 2024-05-18 DIAGNOSIS — R413 Other amnesia: Secondary | ICD-10-CM | POA: Diagnosis not present

## 2024-05-23 ENCOUNTER — Other Ambulatory Visit: Payer: Self-pay | Admitting: Hematology

## 2024-05-31 NOTE — Assessment & Plan Note (Signed)
 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 S/p left lumpectomy with SLNB Jeoffrey). Oncotype Dx RS of 33, showing high risk. She completed adjuvant Chemo with weekly Taxol  08/09/20 - 10/24/20 and adjuvant radiation 12/06/20 - 01/01/21.   -She began tamoxifen  in 01/2021. She is tolerating well. -Mammogram from 06/2022 was benign.  -Ms. Sparlin is clinically doing well.  Tolerating tamoxifen  without significant side effects.  Breast exam is benign, labs are unremarkable.  Calcium 10.6 likely from dairy and Ca supplement -Overall no clinical concern for recurrence.  Continue breast cancer surveillance and tamoxifen  -Mammo 06/11/2023 yielded negative results.  -Follow-up with us  in 6 months, or sooner if needed

## 2024-05-31 NOTE — Progress Notes (Unsigned)
 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 S/p left lumpectomy with [facility]rey). Oncotype Dx RS of 33, showing high risk. She completed adjuvant Chemo with weekly Taxol  08/09/20 - 10/24/20 and adjuvant radiation 12/06/20 - 01/01/21.   -She began tamoxifen  in 01/2021. She is tolerating well. -Mammogram from 06/2022 was benign.  -Doris Ellis is clinically doing well.  Tolerating tamoxifen  without significant side effects.  Breast exam is benign, labs are unremarkable.   -Overall no clinical concern for recurrence.  Continue breast cancer surveillance.  -Continue tamoxifen  20 mg daily. -Mammo 06/11/2023 yielded negative results.  She is scheduled for screening mammogram on 06/12/2024. -Follow-up with us  in 6 months, or sooner if needed   Chronic low back pain  The patient has history of herniated lumbar disc. Had to have surgical repair. Has been taking dose cyclobenzaprine  as needed to help relax very tight muscles. She states that, for some time, she had been taking this once on most days. She had recent visit with her primary care who asked that she come off the medication. He recommended she take tylenol  or aleve/ibuprofen  as needed for pain. She states that she would do this. She is worried that she may not get enough relief with the OTC medications during times when her pain  gets to be severe. I advised her that I agree, she should not take the cyclobenzaprine  every day. This is something that she should save for truly as needed moments when the muscles in her back are very tight and sore. Having the history of disc rupture and surgical repair, it is likely that she does have episodes when the back pain can be more severe. I encouraged her to speak with her primary care provider again, asking to keep the prescription active so she can use this on an as needed basis. She stated she would and asked that I forward my progress note to him.   Left breast cancer From a breast cancer perspective, she is doing well with no noted changes or concerns in either breast. She is tolerating the tamoxifen  well with minimal side effects. She is scheduled for a screening mammogram 06/12/2024. Plan to continue with follow up visits every 6 months.   Mild anemia Very mild anemia with Hgb 11.9 and Hct 35.1. encouraged her to add low dose iron supplement daily and to take with orange juice or vitamin C. Will continue to monitor levels every 6 months and more often as indicated.    Plan Labs reviewed.  -Mild anemia with Hgb 11.9 and Hct 35.1. Recommended oral iron with orange juice daily. -stable and unremarkable CMP Scheduled for screening mammogram 06/12/2024 Repeat DEXA scan in 05/2025.  Continue tamoxifen  daily.  Labs and follow up in 6 months, sooner if needed   The patient understands the plans discussed today and is in agreement with them.  She knows to contact our office if she develops concerns prior to her  next appointment.  I provided 25 minutes of face-to-face time during this encounter and > 50% was spent counseling as documented under my assessment and plan.    Doris FORBES Lessen, NP  [facility] - A DEPT OF [facility] [address] [place] [address] Dept: [phone] Dept Fax: [phone]   No  orders of the defined types were placed in this encounter.     CHIEF COMPLAINT:  CC: Left breast cancer, estrogen receptor positive  Current Treatment: Tamoxifen  20 mg daily, started 01/2021  INTERVAL HISTORY:  Doris Ellis is here today for repeat clinical assessment.  She was last seen by me on 12/02/2023.  She is scheduled for screening mammogram on 06/12/2024.  DEXA scan done 05/12/2023 showed osteopenia.  States that she is doing well overall.  Does have intermittent low back pain due to history of ruptured lumbar disc and surgery to repair.  She will take Tylenol  and/or ibuprofen  to help pain relief.  Sometimes needs muscle relaxer.  Currently has prescription for cyclobenzaprine .  Concerned that her primary care would like her to stop using this medication.  She does have neuropathy in her feet and hands since treatment with chemotherapy.  This is currently at baseline.  She has noticed no changes or concerns in either breast.  She is tolerating tamoxifen  well with minimal side effects.  She denies chest pain, chest pressure, or shortness of breath. She denies headaches or visual disturbances. She denies abdominal pain, nausea, vomiting, or changes in bowel or bladder habits.  She denies fevers or chills. She denies pain. Her appetite is good. Her weight has decreased 4 pounds over last 6 months.  Of note, the patient reports intermittent episodes of forgetfulness.  States that she will forget someone's name or certain words will not come to mind when she is in conversation.  She is not forgetting the names of family members, where she is, what she is doing or directions.  She wanted to make sure symptoms are noted.  I have reviewed the past medical history, past surgical history, social history and family history with the patient and they are unchanged from previous note.  ALLERGIES:  is allergic to lipitor [atorvastatin calcium], zocor [simvastatin - high dose], and benadryl  [diphenhydramine].  MEDICATIONS:  Current Outpatient Medications  Medication Sig Dispense Refill   ALPRAZolam  (XANAX ) 0.5 MG tablet Take 0.5 mg by mouth See admin instructions. Take 0.5 mg at bedtime, may take a second 0.5 mg dose later in the night as needed for sleep     amLODipine  (NORVASC ) 2.5 MG tablet Take 5 mg by mouth daily.     aspirin EC 81 MG tablet Take 1 tablet (81 mg total) by mouth daily. Swallow whole. 30 tablet 11   Calcium Carbonate-Vit D-Min (QC CALCIUM-MAGNESIUM-ZINC-D3 PO) Take 1 tablet by mouth daily.     Carboxymethylcellul-Glycerin (LUBRICATING EYE DROPS OP) Place 1 drop into both eyes daily as needed (dry eyes).     cyclobenzaprine  (FLEXERIL ) 10 MG tablet TAKE 1 TABLET(10 MG) BY MOUTH THREE TIMES DAILY AS NEEDED FOR MUSCLE SPASMS 30 tablet 0   docusate sodium (COLACE) 100 MG capsule Take 100 mg by mouth daily as needed for mild constipation.     ibuprofen  (ADVIL ) 800 MG tablet Take 800 mg by mouth every 8 (eight) hours as needed for pain.     Multiple Vitamin (MULTIVITAMIN WITH MINERALS) TABS tablet Take 1 tablet by mouth daily.  pravastatin  (PRAVACHOL ) 10 MG tablet Take 10 mg by mouth daily.     pyridOXINE (VITAMIN B-6) 100 MG tablet Take 100 mg by mouth daily.     sertraline  (ZOLOFT ) 50 MG tablet Take 50 mg by mouth daily.     Sodium Chloride -Sodium Bicarb (AYR SALINE NASAL RINSE NA) Place 1 Dose into the nose daily.     tamoxifen  (NOLVADEX ) 20 MG tablet TAKE 1 TABLET(20 MG) BY MOUTH DAILY 30 tablet 3   No current facility-administered medications for this visit.    HISTORY OF PRESENT ILLNESS:   Oncology History Overview Note  Cancer Staging Malignant neoplasm of upper-inner quadrant of female breast [facility]) Staging form: Breast, AJCC 8th Edition - Clinical stage from 06/11/2020: Stage IA (cT1b, cN0, cM0, G2, ER+, PR-, HER2: Equivocal) - Signed by Ann Mayme POUR, NP on 07/01/2020 - Pathologic stage from 07/05/2020: Stage IA (pT1c, pN0, cM0, G2, ER+, PR-, HER2-,  Oncotype DX score: 33) - Signed by Lanny Callander, MD on 07/29/2020    Malignant neoplasm of upper-inner quadrant of female breast (HCC)  05/28/2020 Mammogram   Diagnostic left mammogram/US   -On physical exam, I palpate discrete focal soft thickening in the 10-11 o'clock location of the LEFT breast. -Targeted ultrasound is performed, showing an irregular taller than wide hypoechoic mass with indistinct margins in the 10 o'clock location of the LEFT breast 8 centimeters from the nipple. There is associated posterior acoustic shadowing. No significant internal blood flow identified on Doppler evaluation.  IMPRESSION: Suspicious mass in the 10 o'clock location of the LEFT breast. No LEFT axillary adenopathy.     06/11/2020 Cancer Staging   Staging form: Breast, AJCC 8th Edition - Clinical stage from 06/11/2020: Stage IA (cT1b, cN0, cM0, G2, ER+, PR-, HER2: Equivocal) - Signed by Burton, [name] K, NP on 07/01/2020   06/11/2020 Initial Biopsy   FINAL MICROSCOPIC DIAGNOSIS: A. BREAST, 10:00, LEFT, BIOPSY: - Invasive ductal carcinoma The carcinoma appears Nottingham grade 1-2 of 3 and measures 0.9 cm in greatest linear extent  PROGNOSTIC INDICATOR RESULTS: The tumor cells are EQUIVOCAL for Her2 (2+) Estrogen Receptor: 90%, MODERATE STAINING INTENSITY Progesterone Receptor: NEGATIVE Proliferation Marker Ki-67: 5%  FLOURESCENCE IN-SITU HYBRIDIZATION RESULTS: GROUP 5: HER2 **NEGATIVE** RATIO of HER2/CEN 17 SIGNALS: 1.18 AVERAGE HER2 COPY NUMBER PER CELL: 1.30   07/01/2020 Initial Diagnosis   Malignant neoplasm of upper-inner quadrant of female breast (HCC)   07/05/2020 Surgery   LEFT BREAST LUMPECTOMY WITH RADIOACTIVE SEED AND LEFT AXILLARY SENTINEL LYMPH NODE BIOPSY by Dr Ethyl    07/05/2020 Pathology Results   FINAL MICROSCOPIC DIAGNOSIS:   A. BREAST, LEFT, LUMPECTOMY:  - Invasive ductal carcinoma, 1.8 cm.  - Margins not involved.  - Biopsy site and biopsy clip.  - See oncology table.    B. LYMPH NODE, LEFT AXILLARY, SENTINEL, EXCISION:  - One lymph node with no metastatic carcinoma (0/1).    07/05/2020 Oncotype testing   Oncotype  Recurrence Score 33 Distant recurrence risk at 9 years with Tamoxifen  alone is 21% There is >15% benefit of adjuvant chemotherapy.    07/05/2020 Cancer Staging   Staging form: Breast, AJCC 8th Edition - Pathologic stage from 07/05/2020: Stage IA (pT1c, pN0, cM0, G2, ER+, PR-, HER2-, Oncotype DX score: 33) - Signed by Lanny Callander, MD on 07/29/2020   08/09/2020 - 10/24/2020 Chemotherapy   Weekly Taxol  for 12 weeks starting 08/09/20-10/24/20   12/06/2020 - 01/01/2021 Radiation Therapy   Adjuvant Radiation with Dr Dewey    01/2021 -  Anti-estrogen oral therapy  Tamoxifen  20mg  daily starting in late 01/2021   04/28/2021 Survivorship   SCP delivered by [name] Burton, NP    06/11/2023 Mammogram   3D screening mammogram  IMPRESSION: No mammographic evidence of malignancy.   RECOMMENDATION: Screening mammogram in one year. (Code:[id number]-B-01Y)  BI-RADS CATEGORY  1: Negative.       REVIEW OF SYSTEMS:   Constitutional: Denies fevers, chills or abnormal weight loss Eyes: Denies blurriness of vision Ears, nose, mouth, throat, and face: Denies mucositis or sore throat Respiratory: Denies cough, dyspnea or wheezes Cardiovascular: Denies palpitation, chest discomfort or lower extremity swelling Gastrointestinal:  Denies nausea, heartburn or change in bowel habits Skin: Denies abnormal skin rashes Lymphatics: Denies new lymphadenopathy or easy bruising Neurological:Denies numbness, tingling or new weaknesses. Has noted a little bit of forgetfulness recently.  Chronic neuropathy in both feet and both hands. Behavioral/Psych: Mood is stable, no new changes  All other systems were reviewed with the patient and are negative.   VITALS:   Today's Vitals   06/01/24 0941  BP: 138/70  Pulse: 72  Resp: 17  Temp: 97.7 F (36.5 C)  SpO2: 99%  Weight: 125 lb  14.4 oz (57.1 kg)   Body mass index is 21.61 kg/m.   Wt Readings from Last 3 Encounters:  06/01/24 125 lb 14.4 oz (57.1 kg)  12/02/23 129 lb 4.8 oz (58.7 kg)  06/01/23 131 lb (59.4 kg)    Body mass index is 21.61 kg/m.  Performance status (ECOG): 1 - Symptomatic but completely ambulatory  PHYSICAL EXAM:   GENERAL:alert, no distress and comfortable SKIN: skin color, texture, turgor are normal, no rashes or significant lesions EYES: normal, Conjunctiva are pink and non-injected, sclera clear OROPHARYNX:no exudate, no erythema and lips, buccal mucosa, and tongue normal  NECK: supple, thyroid  normal size, non-tender, without nodularity LYMPH:  no palpable lymphadenopathy in the cervical, axillary or inguinal LUNGS: clear to auscultation and percussion with normal breathing effort HEART: regular rate & rhythm and no murmurs and no lower extremity edema ABDOMEN:abdomen soft, non-tender and normal bowel sounds Musculoskeletal:no cyanosis of digits and no clubbing  NEURO: alert & oriented x 3 with fluent speech, no focal motor/sensory deficits BREAST: There are is well-healed scar around lateral aspect of the left nipple.  Scar is well-healed.  There are no masses or lumps in the left breast.  There is no nipple inversion or nipple discharge.  There is no axillary lymphadenopathy on the left.  The right breast has no palpable lumps or masses.  There is no nipple inversion or nipple discharge.  There is no axillary lymphadenopathy on the right.  LABORATORY DATA:  I have reviewed the data as listed    Component Value Date/Time   NA 135 06/01/2024 0908   K 4.0 06/01/2024 0908   CL 99 06/01/2024 0908   CO2 26 06/01/2024 0908   GLUCOSE 97 06/01/2024 0908   BUN 13 06/01/2024 0908   CREATININE 0.81 06/01/2024 0908   CALCIUM 9.9 06/01/2024 0908   PROT 9.1 (H) 06/01/2024 0908   ALBUMIN 3.4 (L) 06/01/2024 0908   AST 22 06/01/2024 0908   ALT 6 06/01/2024 0908   ALKPHOS 29 (L) 06/01/2024  0908   BILITOT 0.4 06/01/2024 0908   GFRNONAA >60 06/01/2024 0908   GFRAA >60 08/05/2020 1200    Lab Results  Component Value Date   WBC 6.1 06/01/2024   NEUTROABS 3.9 06/01/2024   HGB 11.9 (L) 06/01/2024   HCT 35.1 (L) 06/01/2024   MCV 87.3 06/01/2024  PLT 287 06/01/2024

## 2024-06-01 ENCOUNTER — Encounter: Payer: Self-pay | Admitting: Nurse Practitioner

## 2024-06-01 ENCOUNTER — Inpatient Hospital Stay: Payer: Medicare HMO | Attending: Nurse Practitioner

## 2024-06-01 ENCOUNTER — Inpatient Hospital Stay: Payer: Medicare HMO | Admitting: Nurse Practitioner

## 2024-06-01 VITALS — BP 138/70 | HR 72 | Temp 97.7°F | Resp 17 | Wt 125.9 lb

## 2024-06-01 DIAGNOSIS — Z1732 Human epidermal growth factor receptor 2 negative status: Secondary | ICD-10-CM | POA: Insufficient documentation

## 2024-06-01 DIAGNOSIS — Z9221 Personal history of antineoplastic chemotherapy: Secondary | ICD-10-CM | POA: Insufficient documentation

## 2024-06-01 DIAGNOSIS — Z79810 Long term (current) use of selective estrogen receptor modulators (SERMs): Secondary | ICD-10-CM | POA: Diagnosis not present

## 2024-06-01 DIAGNOSIS — Z7982 Long term (current) use of aspirin: Secondary | ICD-10-CM | POA: Diagnosis not present

## 2024-06-01 DIAGNOSIS — Z79899 Other long term (current) drug therapy: Secondary | ICD-10-CM | POA: Diagnosis not present

## 2024-06-01 DIAGNOSIS — Z1722 Progesterone receptor negative status: Secondary | ICD-10-CM | POA: Insufficient documentation

## 2024-06-01 DIAGNOSIS — C50212 Malignant neoplasm of upper-inner quadrant of left female breast: Secondary | ICD-10-CM | POA: Diagnosis not present

## 2024-06-01 DIAGNOSIS — Z170 Estrogen receptor positive status [ER+]: Secondary | ICD-10-CM | POA: Diagnosis not present

## 2024-06-01 DIAGNOSIS — Z923 Personal history of irradiation: Secondary | ICD-10-CM | POA: Insufficient documentation

## 2024-06-01 DIAGNOSIS — D649 Anemia, unspecified: Secondary | ICD-10-CM | POA: Insufficient documentation

## 2024-06-01 LAB — CBC WITH DIFFERENTIAL (CANCER CENTER ONLY)
Abs Immature Granulocytes: 0.02 K/uL (ref 0.00–0.07)
Basophils Absolute: 0.1 K/uL (ref 0.0–0.1)
Basophils Relative: 1 %
Eosinophils Absolute: 0.3 K/uL (ref 0.0–0.5)
Eosinophils Relative: 5 %
HCT: 35.1 % — ABNORMAL LOW (ref 36.0–46.0)
Hemoglobin: 11.9 g/dL — ABNORMAL LOW (ref 12.0–15.0)
Immature Granulocytes: 0 %
Lymphocytes Relative: 20 %
Lymphs Abs: 1.2 K/uL (ref 0.7–4.0)
MCH: 29.6 pg (ref 26.0–34.0)
MCHC: 33.9 g/dL (ref 30.0–36.0)
MCV: 87.3 fL (ref 80.0–100.0)
Monocytes Absolute: 0.6 K/uL (ref 0.1–1.0)
Monocytes Relative: 10 %
Neutro Abs: 3.9 K/uL (ref 1.7–7.7)
Neutrophils Relative %: 64 %
Platelet Count: 287 K/uL (ref 150–400)
RBC: 4.02 MIL/uL (ref 3.87–5.11)
RDW: 15.1 % (ref 11.5–15.5)
WBC Count: 6.1 K/uL (ref 4.0–10.5)
nRBC: 0 % (ref 0.0–0.2)

## 2024-06-01 LAB — CMP (CANCER CENTER ONLY)
ALT: 6 U/L (ref 0–44)
AST: 22 U/L (ref 15–41)
Albumin: 3.4 g/dL — ABNORMAL LOW (ref 3.5–5.0)
Alkaline Phosphatase: 29 U/L — ABNORMAL LOW (ref 38–126)
Anion gap: 10 (ref 5–15)
BUN: 13 mg/dL (ref 8–23)
CO2: 26 mmol/L (ref 22–32)
Calcium: 9.9 mg/dL (ref 8.9–10.3)
Chloride: 99 mmol/L (ref 98–111)
Creatinine: 0.81 mg/dL (ref 0.44–1.00)
GFR, Estimated: >60 mL/min (ref >60)
Glucose, Bld: 97 mg/dL (ref 70–99)
Potassium: 4 mmol/L (ref 3.5–5.1)
Sodium: 135 mmol/L (ref 135–145)
Total Bilirubin: 0.4 mg/dL (ref 0.0–1.2)
Total Protein: 9.1 g/dL — ABNORMAL HIGH (ref 6.5–8.1)

## 2024-06-02 ENCOUNTER — Telehealth: Payer: Self-pay | Admitting: Nurse Practitioner

## 2024-06-02 ENCOUNTER — Other Ambulatory Visit: Payer: Self-pay

## 2024-06-02 NOTE — Telephone Encounter (Signed)
Scheduled appointments with the patient

## 2024-06-12 ENCOUNTER — Ambulatory Visit
Admission: RE | Admit: 2024-06-12 | Discharge: 2024-06-12 | Disposition: A | Discharge status: Home / Self Care | Source: Ambulatory Visit | Attending: Internal Medicine | Admitting: Internal Medicine

## 2024-06-12 DIAGNOSIS — Z1231 Encounter for screening mammogram for malignant neoplasm of breast: Secondary | ICD-10-CM

## 2024-09-21 ENCOUNTER — Encounter: Payer: Self-pay | Admitting: Neurology

## 2024-09-21 ENCOUNTER — Ambulatory Visit (INDEPENDENT_AMBULATORY_CARE_PROVIDER_SITE_OTHER): Admitting: Neurology

## 2024-09-21 VITALS — BP 174/73 | HR 67 | Ht 64.0 in | Wt 123.5 lb

## 2024-09-21 DIAGNOSIS — R4189 Other symptoms and signs involving cognitive functions and awareness: Secondary | ICD-10-CM

## 2024-09-21 NOTE — Patient Instructions (Signed)
 Continue current medications Will check a B12 and TSH today Consider B12 supplementation, 1000 mcg daily Continue to follow with your PCP Return as needed  There are well-accepted and sensible ways to reduce risk for Alzheimers disease and other degenerative brain disorders .  Exercise Daily Walk A daily 20 minute walk should be part of your routine. Disease related apathy can be a significant roadblock to exercise and the only way to overcome this is to make it a daily routine and perhaps have a reward at the end (something your loved one loves to eat or drink perhaps) or a personal trainer coming to the home can also be very useful. Most importantly, the patient is much more likely to exercise if the caregiver / spouse does it with him/her. In general a structured, repetitive schedule is best.  General Health: Any diseases which effect your body will effect your brain such as a pneumonia, urinary infection, blood clot, heart attack or stroke. Keep contact with your primary care doctor for regular follow ups.  Sleep. A good nights sleep is healthy for the brain. Seven hours is recommended. If you have insomnia or poor sleep habits we can give you some instructions. If you have sleep apnea wear your mask.  Diet: Eating a heart healthy diet is also a good idea; fish and poultry instead of red meat, nuts (mostly non-peanuts), vegetables, fruits, olive oil or canola oil (instead of butter), minimal salt (use other spices to flavor foods), whole grain rice, bread, cereal and pasta and wine in moderation.Research is now showing that the MIND diet, which is a combination of The Mediterranean diet and the DASH diet, is beneficial for cognitive processing and longevity. Information about this diet can be found in The MIND Diet, a book by Annitta Feeling, MS, RDN, and online at wildwildscience.es  Finances, Power of [address] and Advance Directives: You should consider putting legal  safeguards in place with regard to financial and medical decision making. While the spouse always has power of attorney for medical and financial issues in the absence of any form, you should consider what you want in case the spouse / caregiver is no longer around or capable of making decisions.

## 2024-09-21 NOTE — Progress Notes (Signed)
 [facility]  PATIENT: Doris Ellis DOB: May 10, 1941  REQUESTING CLINICIAN: Sheryle Carwin, MD HISTORY FROM: Patient and sister in law  REASON FOR VISIT: Memory loss    HISTORICAL  CHIEF COMPLAINT:  Chief Complaint  Patient presents with   RM12/MEMORY    Pt is here with her Sister-in-Law. Pt states that she has a hard time with remembering some thing.     HISTORY OF PRESENT ILLNESS:  Discussed the use of AI scribe software for clinical note transcription with the patient, who gave verbal consent to proceed.  Doris Ellis is an 83 year old female with a history of traumatic brain injury, hypertension, hyperlipidemia, depression and breast cancer who presents with memory concerns. She was referred by Dr. Sheryle for evaluation of memory concerns.  She has been experiencing memory concerns, noted by her family, with difficulty recalling names and short-term memory issues, although she can usually remember events from the previous day. She recalls a significant head injury 40 years ago resulting in a concussion, after which she was prescribed vitamin B6, which she has taken for 40 years. She mentions a second concussion but provides no further details.  She lives independently in [place], managing daily activities such as cooking, cleaning, shopping, and driving without issues. She has no problems with finances or remembering to take her medications. She has a history of breast cancer diagnosed in 2021, underwent chemotherapy, and is currently in remission. She reports neuropathy affecting her feet and hands since her cancer treatment, leading to occasional balance issues.  Her family history includes a brother diagnosed with dementia who had his driver's license revoked. She has a history of anxiety and depression, for which she takes Zoloft . She also has high blood pressure and high cholesterol. No history of stroke, seizures, or sleep apnea. She engages in regular  physical activity, walking her German shepherd about a mile daily, and enjoys painting as a hobby.    TBI: Yes, 40 years ago  Stroke:   no past history of stroke Seizures:   no past history of seizures Sleep:   no history of sleep apnea.  Mood: Yes, on Zoloft    Family history of Dementia: Brother with dementia in his 66   Functional status: independent in all ADLs and IADLs Patient lives with alone. Cooking: No issues Cleaning: No issues Shopping: No issues Bathing: No issues Toileting: No issues Driving: No issues, denies being lost in familiar places Bills: No issues Medications: No issues Ever left the stove on by accident?:  Denies Forget how to use items around the house?:  Denies Getting lost going to familiar places?:  Denies Forgetting loved ones names?:  Yes sometime Word finding difficulty? Yes Sleep: No issues   OTHER MEDICAL CONDITIONS: Hypertension, hyperlipidemia, history of breast cancer in 2021, TBI 4 years ago, anxiety and depression   REVIEW OF SYSTEMS: Full 14 system review of systems performed and negative with exception of: As noted in the HPI  ALLERGIES: Allergies  Allergen Reactions   Lipitor [Atorvastatin Calcium] Other (See Comments)    Muscle weakness   Zocor [Simvastatin - High Dose]     Muscle weakness   Benadryl [Diphenhydramine] Rash    HOME MEDICATIONS: Outpatient Medications Prior to Visit  Medication Sig Dispense Refill   ALPRAZolam  (XANAX ) 0.5 MG tablet Take 0.5 mg by mouth See admin instructions. Take 0.5 mg at bedtime, may take a second 0.5 mg dose later in the night as needed for sleep  amLODipine  (NORVASC ) 2.5 MG tablet Take 5 mg by mouth daily.     aspirin EC 81 MG tablet Take 1 tablet (81 mg total) by mouth daily. Swallow whole. 30 tablet 11   Calcium Carbonate-Vit D-Min (QC CALCIUM-MAGNESIUM-ZINC-D3 PO) Take 1 tablet by mouth daily.     Carboxymethylcellul-Glycerin (LUBRICATING EYE DROPS OP) Place 1 drop into both eyes  daily as needed (dry eyes).     fluticasone  (FLONASE ) 50 MCG/ACT nasal spray Place 2 sprays into both nostrils daily.     ibuprofen  (ADVIL ) 800 MG tablet Take 800 mg by mouth every 8 (eight) hours as needed for pain.     Multiple Vitamin (MULTIVITAMIN WITH MINERALS) TABS tablet Take 1 tablet by mouth daily.     pravastatin  (PRAVACHOL ) 10 MG tablet Take 10 mg by mouth daily.     pyridOXINE (VITAMIN B-6) 100 MG tablet Take 100 mg by mouth daily.     sertraline  (ZOLOFT ) 50 MG tablet Take 50 mg by mouth daily.     Sodium Chloride -Sodium Bicarb (AYR SALINE NASAL RINSE NA) Place 1 Dose into the nose daily.     tamoxifen  (NOLVADEX ) 20 MG tablet TAKE 1 TABLET(20 MG) BY MOUTH DAILY 30 tablet 3   cyclobenzaprine  (FLEXERIL ) 10 MG tablet TAKE 1 TABLET(10 MG) BY MOUTH THREE TIMES DAILY AS NEEDED FOR MUSCLE SPASMS (Patient not taking: Reported on 09/21/2024) 30 tablet 0   docusate sodium (COLACE) 100 MG capsule Take 100 mg by mouth daily as needed for mild constipation. (Patient not taking: Reported on 09/21/2024)     No facility-administered medications prior to visit.    PAST MEDICAL HISTORY: Past Medical History:  Diagnosis Date   Anxiety    Arthritis    Cancer (HCC) 05/2020   left breast IDC   Concussion 1985   Depression    Hyperlipidemia    Hypertension    Mild concussion 2002   tripped over bag at work   Osteopenia 07/2011   T score -1.8 FRAX 11%/1.5%   Panic attacks    Personal history of chemotherapy    Personal history of radiation therapy    Seasonal allergies     PAST SURGICAL HISTORY: Past Surgical History:  Procedure Laterality Date   ABDOMINAL HYSTERECTOMY  01/1989   TAH BSO   BACK SURGERY  1996   ruputured disc L-5   BLADDER SUSPENSION  1982   BREAST BIOPSY Left    BREAST LUMPECTOMY Left 07/05/2020   BREAST LUMPECTOMY WITH RADIOACTIVE SEED AND SENTINEL LYMPH NODE BIOPSY Left 07/05/2020   Procedure: LEFT BREAST LUMPECTOMY WITH RADIOACTIVE SEED AND LEFT AXILLARY  SENTINEL LYMPH NODE BIOPSY;  Surgeon: Ethyl Lenis, MD;  Location: [facility];  Service: General;  Laterality: Left;  LEFT BREAST CANCER   BREAST SURGERY  1984   cyst-benign   COLONOSCOPY N/A 03/30/2013   Procedure: COLONOSCOPY;  Surgeon: Claudis RAYMOND Rivet, MD;  [facility]: [facility];  Service: Endoscopy;  Laterality: N/A;  930   COLONOSCOPY WITH PROPOFOL  N/A 03/04/2022   Procedure: COLONOSCOPY WITH PROPOFOL ;  Surgeon: Rivet Claudis RAYMOND, MD;  Location: [facility];  Service: Endoscopy;  Laterality: N/A;  820   DILATION AND CURETTAGE OF UTERUS  12/09/1988   abnormal uterine bleeding   IR IMAGING GUIDED PORT INSERTION  08/05/2020   POLYPECTOMY  03/04/2022   Procedure: POLYPECTOMY;  Surgeon: Rivet Claudis RAYMOND, MD;  Location: [facility];  Service: Endoscopy;;  cecal x2; sigmoid colon;   PORT-A-CATH REMOVAL Right 04/17/2021  Procedure: REMOVAL PORT-A-CATH;  Surgeon: Vanderbilt Ned, MD;  Location: [facility];  Service: General;  Laterality: Right;   TONSILLECTOMY      FAMILY HISTORY: Family History  Problem Relation Age of Onset   Cancer Father        non-hogkins lymphoma, & CLL   Diabetes Maternal Uncle    Hypertension Maternal Grandmother    Heart disease Maternal Grandfather    Cancer Maternal Grandfather        lung cancer   Osteoporosis Maternal Aunt     SOCIAL HISTORY: Social History   Socioeconomic History   Marital status: Divorced    Spouse name: Not on file   Number of children: Not on file   Years of education: Not on file   Highest education level: Not on file  Occupational History   Occupation: retired water quality scientist, designer, industrial/product  Tobacco Use   Smoking status: Never   Smokeless tobacco: Never  Substance and Sexual Activity   Alcohol use: Yes    Comment: social   Drug use: Yes    Types: Marijuana    Comment: occasionally   Sexual activity: Not Currently    Partners: Male    Birth control/protection: Surgical    Comment: hyst   Other Topics Concern   Not on file  Social History Narrative   Not on file   Social Drivers of Health   Financial Resource Strain: Medium Risk (07/04/2020)   Overall Financial Resource Strain (CARDIA)    Difficulty of Paying Living Expenses: Somewhat hard  Food Insecurity: No Food Insecurity (07/04/2020)   Hunger Vital Sign    Worried About Running Out of Food in the Last Year: Never true    Ran Out of Food in the Last Year: Never true  Transportation Needs: No Transportation Needs (07/04/2020)   PRAPARE - Administrator, Civil Service (Medical): No    Lack of Transportation (Non-Medical): No  Physical Activity: Insufficiently Active (07/04/2020)   Exercise Vital Sign    Days of Exercise per Week: 7 days    Minutes of Exercise per Session: 20 min  Stress: Not on file  Social Connections: Unknown (07/04/2020)   Social Connection and Isolation Panel    Frequency of Communication with Friends and Family: More than three times a week    Frequency of Social Gatherings with Friends and Family: Once a week    Attends Religious Services: Never    Database Administrator or Organizations: Not on file    Attends Banker Meetings: Not on file    Marital Status: Not on file  Intimate Partner Violence: Not At Risk (04/28/2021)   Humiliation, Afraid, Rape, and Kick questionnaire    Fear of Current or Ex-Partner: No    Emotionally Abused: No    Physically Abused: No    Sexually Abused: No    PHYSICAL EXAM  GENERAL EXAM/CONSTITUTIONAL: Vitals:  Vitals:   09/21/24 1356  BP: (!) 174/73  Pulse: 67  SpO2: 99%  Weight: 123 lb 8 oz (56 kg)  Height: 5' 4 (1.626 m)   Body mass index is 21.2 kg/m. Wt Readings from Last 3 Encounters:  09/21/24 123 lb 8 oz (56 kg)  06/01/24 125 lb 14.4 oz (57.1 kg)  12/02/23 129 lb 4.8 oz (58.7 kg)   Patient is in no distress; well developed, nourished and groomed; neck is supple  MUSCULOSKELETAL: Gait, strength, tone, movements noted  in Neurologic exam below  NEUROLOGIC: MENTAL STATUS:  No data to display            09/21/2024    2:04 PM  Montreal Cognitive Assessment   Visuospatial/ Executive (0/5) 4  Naming (0/3) 2  Attention: Read list of digits (0/2) 2  Attention: Read list of letters (0/1) 0  Attention: Serial 7 subtraction starting at 100 (0/3) 2  Language: Repeat phrase (0/2) 1  Language : Fluency (0/1) 1  Abstraction (0/2) 2  Delayed Recall (0/5) 0  Orientation (0/6) 6  Total 20  Adjusted Score (based on education) 20    awake, alert, oriented to person, place and time recent and remote memory intact normal attention and concentration language fluent, comprehension intact, naming intact fund of knowledge appropriate  CRANIAL NERVE:  2nd, 3rd, 4th, 6th- visual fields full to confrontation, extraocular muscles intact, no nystagmus 5th - facial sensation symmetric 7th - facial strength symmetric 8th - hearing intact 9th - palate elevates symmetrically, uvula midline 11th - shoulder shrug symmetric 12th - tongue protrusion midline  MOTOR:  normal bulk and tone, full strength in the BUE, BLE  SENSORY:  normal and symmetric to light touch  COORDINATION:  finger-nose-finger, fine finger movements normal  GAIT/STATION:  normal   DIAGNOSTIC DATA (LABS, IMAGING, TESTING) - I reviewed patient records, labs, notes, testing and imaging myself where available.  Lab Results  Component Value Date   WBC 6.1 06/01/2024   HGB 11.9 (L) 06/01/2024   HCT 35.1 (L) 06/01/2024   MCV 87.3 06/01/2024   PLT 287 06/01/2024      Component Value Date/Time   NA 135 06/01/2024 0908   K 4.0 06/01/2024 0908   CL 99 06/01/2024 0908   CO2 26 06/01/2024 0908   GLUCOSE 97 06/01/2024 0908   BUN 13 06/01/2024 0908   CREATININE 0.81 06/01/2024 0908   CALCIUM 9.9 06/01/2024 0908   PROT 9.1 (H) 06/01/2024 0908   ALBUMIN 3.4 (L) 06/01/2024 0908   AST 22 06/01/2024 0908   ALT 6 06/01/2024 0908    ALKPHOS 29 (L) 06/01/2024 0908   BILITOT 0.4 06/01/2024 0908   GFRNONAA >60 06/01/2024 0908   GFRAA >60 08/05/2020 1200   No results found for: CHOL, HDL, LDLCALC, LDLDIRECT, TRIG, CHOLHDL No results found for: YHAJ8R No results found for: VITAMINB12 No results found for: TSH    ASSESSMENT AND PLAN  83 y.o. year old female with    Mild cognitive impairment Scored 20/30 on cognitive test, indicating mild cognitive impairment. Independent in activities of daily living. No significant memory loss affecting daily functioning. History of TBI and chemotherapy may contribute to cognitive changes. No current evidence of dementia. - Ordered thyroid  function tests and B12 level - Recommended B12 supplementation at 1000 mcg daily - Encouraged 20 minutes of exercise five days a week - Advised engaging in brain-stimulating activities such as reading, Sudoku, and painting  Peripheral neuropathy secondary to chemotherapy Peripheral neuropathy affecting feet and hands, likely secondary to chemotherapy. Occasional balance issues reported.  Depression and anxiety disorder Currently managed with Zoloft . No acute concerns discussed during the visit.    1. Cognitive impairment      Patient Instructions  Continue current medications Will check a B12 and TSH today Consider B12 supplementation, 1000 mcg daily Continue to follow with your PCP Return as needed  There are well-accepted and sensible ways to reduce risk for Alzheimers disease and other degenerative brain disorders .  Exercise Daily Walk A daily 20 minute walk should be part of your  routine. Disease related apathy can be a significant roadblock to exercise and the only way to overcome this is to make it a daily routine and perhaps have a reward at the end (something your loved one loves to eat or drink perhaps) or a personal trainer coming to the home can also be very useful. Most importantly, the patient is much more  likely to exercise if the caregiver / spouse does it with him/her. In general a structured, repetitive schedule is best.  General Health: Any diseases which effect your body will effect your brain such as a pneumonia, urinary infection, blood clot, heart attack or stroke. Keep contact with your primary care doctor for regular follow ups.  Sleep. A good nights sleep is healthy for the brain. Seven hours is recommended. If you have insomnia or poor sleep habits we can give you some instructions. If you have sleep apnea wear your mask.  Diet: Eating a heart healthy diet is also a good idea; fish and poultry instead of red meat, nuts (mostly non-peanuts), vegetables, fruits, olive oil or canola oil (instead of butter), minimal salt (use other spices to flavor foods), whole grain rice, bread, cereal and pasta and wine in moderation.Research is now showing that the MIND diet, which is a combination of The Mediterranean diet and the DASH diet, is beneficial for cognitive processing and longevity. Information about this diet can be found in The MIND Diet, a book by Annitta Feeling, MS, RDN, and online at wildwildscience.es  Finances, Power of [address] and Advance Directives: You should consider putting legal safeguards in place with regard to financial and medical decision making. While the spouse always has power of attorney for medical and financial issues in the absence of any form, you should consider what you want in case the spouse / caregiver is no longer around or capable of making decisions.     Orders Placed This Encounter  Procedures   Vitamin B12   TSH    No orders of the defined types were placed in this encounter.   Return if symptoms worsen or fail to improve.  I personally spent a total of 65 minutes in the care of the patient today including preparing to see the patient, getting/reviewing separately obtained history, performing a medically appropriate  exam/evaluation, counseling and educating, placing orders, documenting clinical information in the EHR, and communicating results.   Pastor Falling, MD 09/21/2024, 8:33 PM  [facility] [address] [place], [place] [address] [phone]

## 2024-09-22 ENCOUNTER — Ambulatory Visit: Payer: Self-pay | Admitting: Neurology

## 2024-09-22 LAB — VITAMIN B12: Vitamin B-12: 534 pg/mL (ref 232–1245)

## 2024-09-22 LAB — TSH: TSH: 2.13 u[IU]/mL (ref 0.450–4.500)

## 2024-12-04 ENCOUNTER — Other Ambulatory Visit: Payer: Self-pay | Admitting: Nurse Practitioner

## 2024-12-04 DIAGNOSIS — C50212 Malignant neoplasm of upper-inner quadrant of left female breast: Secondary | ICD-10-CM

## 2024-12-04 NOTE — Assessment & Plan Note (Signed)
 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 S/p left lumpectomy with SLNB Jeoffrey). Oncotype Dx RS of 33, showing high risk. She completed adjuvant Chemo with weekly Taxol  08/09/20 - 10/24/20 and adjuvant radiation 12/06/20 - 01/01/21.   -She began tamoxifen  in 01/2021. She is tolerating well. -Mammogram from 06/2022 was benign.  -Doris Ellis is clinically doing well.  Tolerating tamoxifen  without significant side effects.  Breast exam is benign, labs are unremarkable.  Calcium 10.6 likely from dairy and Ca supplement -Overall no clinical concern for recurrence.  Continue breast cancer surveillance and tamoxifen  -Mammo 06/11/2023 yielded negative results.  -Follow-up with us  in 6 months, or sooner if needed

## 2024-12-06 ENCOUNTER — Inpatient Hospital Stay

## 2024-12-06 ENCOUNTER — Inpatient Hospital Stay: Admitting: Nurse Practitioner

## 2024-12-06 DIAGNOSIS — C50212 Malignant neoplasm of upper-inner quadrant of left female breast: Secondary | ICD-10-CM

## 2025-01-31 ENCOUNTER — Inpatient Hospital Stay: Admitting: Nurse Practitioner

## 2025-01-31 ENCOUNTER — Inpatient Hospital Stay
